# Patient Record
Sex: Female | Born: 1985
Health system: Southern US, Community
[De-identification: ages and names within clinical notes are randomized; demographics above are authoritative.]

## PROBLEM LIST (undated history)

## (undated) ENCOUNTER — Inpatient Hospital Stay (HOSPITAL_COMMUNITY): Payer: Self-pay

## (undated) DIAGNOSIS — M779 Enthesopathy, unspecified: Secondary | ICD-10-CM

## (undated) DIAGNOSIS — Z8619 Personal history of other infectious and parasitic diseases: Secondary | ICD-10-CM

## (undated) DIAGNOSIS — E119 Type 2 diabetes mellitus without complications: Secondary | ICD-10-CM

## (undated) DIAGNOSIS — F32A Depression, unspecified: Secondary | ICD-10-CM

## (undated) DIAGNOSIS — G4730 Sleep apnea, unspecified: Secondary | ICD-10-CM

## (undated) DIAGNOSIS — O24919 Unspecified diabetes mellitus in pregnancy, unspecified trimester: Secondary | ICD-10-CM

## (undated) DIAGNOSIS — F329 Major depressive disorder, single episode, unspecified: Secondary | ICD-10-CM

## (undated) DIAGNOSIS — G43909 Migraine, unspecified, not intractable, without status migrainosus: Secondary | ICD-10-CM

## (undated) DIAGNOSIS — E559 Vitamin D deficiency, unspecified: Secondary | ICD-10-CM

## (undated) DIAGNOSIS — O479 False labor, unspecified: Secondary | ICD-10-CM

## (undated) DIAGNOSIS — N83209 Unspecified ovarian cyst, unspecified side: Secondary | ICD-10-CM

## (undated) DIAGNOSIS — K589 Irritable bowel syndrome without diarrhea: Secondary | ICD-10-CM

## (undated) DIAGNOSIS — N200 Calculus of kidney: Secondary | ICD-10-CM

## (undated) DIAGNOSIS — F419 Anxiety disorder, unspecified: Secondary | ICD-10-CM

## (undated) DIAGNOSIS — O24419 Gestational diabetes mellitus in pregnancy, unspecified control: Secondary | ICD-10-CM

## (undated) DIAGNOSIS — E782 Mixed hyperlipidemia: Secondary | ICD-10-CM

## (undated) HISTORY — DX: Migraine, unspecified, not intractable, without status migrainosus: G43.909

## (undated) HISTORY — DX: Sleep apnea, unspecified: G47.30

## (undated) HISTORY — DX: Depression, unspecified: F32.A

## (undated) HISTORY — PX: MOUTH SURGERY: SHX715

## (undated) HISTORY — DX: Mixed hyperlipidemia: E78.2

## (undated) HISTORY — DX: Anxiety disorder, unspecified: F41.9

## (undated) HISTORY — DX: Enthesopathy, unspecified: M77.9

## (undated) HISTORY — DX: Vitamin D deficiency, unspecified: E55.9

## (undated) HISTORY — DX: Personal history of other infectious and parasitic diseases: Z86.19

## (undated) HISTORY — DX: Type 2 diabetes mellitus without complications: E11.9

---
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
## 2006-08-25 ENCOUNTER — Encounter: Admission: RE | Admit: 2006-08-25 | Discharge: 2006-08-25 | Payer: Self-pay | Admitting: Internal Medicine

## 2008-08-05 ENCOUNTER — Emergency Department (HOSPITAL_COMMUNITY): Admission: EM | Admit: 2008-08-05 | Discharge: 2008-08-06 | Payer: Self-pay | Admitting: Emergency Medicine

## 2008-08-09 ENCOUNTER — Inpatient Hospital Stay (HOSPITAL_COMMUNITY): Admission: EM | Admit: 2008-08-09 | Discharge: 2008-08-11 | Payer: Self-pay | Admitting: Emergency Medicine

## 2009-04-28 ENCOUNTER — Emergency Department (HOSPITAL_COMMUNITY): Admission: EM | Admit: 2009-04-28 | Discharge: 2009-04-28 | Payer: Self-pay | Admitting: Emergency Medicine

## 2009-08-04 ENCOUNTER — Emergency Department (HOSPITAL_COMMUNITY): Admission: EM | Admit: 2009-08-04 | Discharge: 2009-08-04 | Payer: Self-pay | Admitting: Emergency Medicine

## 2009-11-08 ENCOUNTER — Ambulatory Visit (HOSPITAL_COMMUNITY): Admission: RE | Admit: 2009-11-08 | Discharge: 2009-11-08 | Payer: Self-pay | Admitting: Sports Medicine

## 2010-06-15 ENCOUNTER — Inpatient Hospital Stay (HOSPITAL_COMMUNITY)
Admission: AD | Admit: 2010-06-15 | Discharge: 2010-06-15 | Payer: Self-pay | Source: Home / Self Care | Attending: Obstetrics and Gynecology | Admitting: Obstetrics and Gynecology

## 2010-07-17 ENCOUNTER — Ambulatory Visit
Admission: RE | Admit: 2010-07-17 | Discharge: 2010-07-17 | Disposition: A | Discharge status: Home / Self Care | Payer: Commercial Managed Care - PPO | Source: Ambulatory Visit | Attending: Obstetrics and Gynecology | Admitting: Obstetrics and Gynecology

## 2010-07-17 ENCOUNTER — Other Ambulatory Visit: Payer: Self-pay | Admitting: Obstetrics and Gynecology

## 2010-07-17 DIAGNOSIS — R109 Unspecified abdominal pain: Secondary | ICD-10-CM

## 2010-07-17 DIAGNOSIS — M549 Dorsalgia, unspecified: Secondary | ICD-10-CM

## 2010-07-31 ENCOUNTER — Encounter: Payer: 59 | Attending: Obstetrics and Gynecology

## 2010-07-31 DIAGNOSIS — O99810 Abnormal glucose complicating pregnancy: Secondary | ICD-10-CM | POA: Insufficient documentation

## 2010-07-31 DIAGNOSIS — Z713 Dietary counseling and surveillance: Secondary | ICD-10-CM | POA: Insufficient documentation

## 2010-08-26 LAB — WET PREP, GENITAL
Clue Cells Wet Prep HPF POC: NONE SEEN
Trich, Wet Prep: NONE SEEN
Yeast Wet Prep HPF POC: NONE SEEN

## 2010-08-26 LAB — GC/CHLAMYDIA PROBE AMP, GENITAL
Chlamydia, DNA Probe: NEGATIVE
GC Probe Amp, Genital: NEGATIVE

## 2010-10-01 LAB — CLOSTRIDIUM DIFFICILE EIA: C difficile Toxins A+B, EIA: NEGATIVE

## 2010-10-01 LAB — BASIC METABOLIC PANEL
BUN: 2 mg/dL — ABNORMAL LOW (ref 6–23)
BUN: 7 mg/dL (ref 6–23)
CO2: 25 mEq/L (ref 19–32)
CO2: 27 mEq/L (ref 19–32)
Calcium: 8.8 mg/dL (ref 8.4–10.5)
Chloride: 106 mEq/L (ref 96–112)
Chloride: 109 mEq/L (ref 96–112)
Creatinine, Ser: 0.75 mg/dL (ref 0.4–1.2)
GFR calc Af Amer: >60 mL/min (ref >60)
GFR calc non Af Amer: >60 mL/min (ref >60)
GFR calc non Af Amer: >60 mL/min (ref >60)
Glucose, Bld: 102 mg/dL — ABNORMAL HIGH (ref 70–99)
Glucose, Bld: 119 mg/dL — ABNORMAL HIGH (ref 70–99)
Potassium: 3.7 mEq/L (ref 3.5–5.1)
Potassium: 4.3 mEq/L (ref 3.5–5.1)
Sodium: 137 mEq/L (ref 135–145)

## 2010-10-01 LAB — URINE MICROSCOPIC-ADD ON

## 2010-10-01 LAB — COMPREHENSIVE METABOLIC PANEL
ALT: 21 U/L (ref 0–35)
AST: 37 U/L (ref 0–37)
AST: 17 U/L (ref 0–37)
Albumin: 3.2 g/dL — ABNORMAL LOW (ref 3.5–5.2)
Albumin: 3.4 g/dL — ABNORMAL LOW (ref 3.5–5.2)
Alkaline Phosphatase: 67 U/L (ref 39–117)
BUN: 6 mg/dL (ref 6–23)
BUN: 8 mg/dL (ref 6–23)
CO2: 25 mEq/L (ref 19–32)
Calcium: 9.3 mg/dL (ref 8.4–10.5)
Calcium: 8.4 mg/dL (ref 8.4–10.5)
Chloride: 104 mEq/L (ref 96–112)
Creatinine, Ser: 0.77 mg/dL (ref 0.4–1.2)
Creatinine, Ser: 0.69 mg/dL (ref 0.4–1.2)
GFR calc Af Amer: >60 mL/min (ref >60)
GFR calc Af Amer: >60 mL/min (ref >60)
GFR calc non Af Amer: >60 mL/min (ref >60)
GFR calc non Af Amer: >60 mL/min (ref >60)
Glucose, Bld: 112 mg/dL — ABNORMAL HIGH (ref 70–99)
Potassium: 4.1 mEq/L (ref 3.5–5.1)
Sodium: 137 mEq/L (ref 135–145)
Total Bilirubin: 0.6 mg/dL (ref 0.3–1.2)
Total Protein: 6.7 g/dL (ref 6.0–8.3)

## 2010-10-01 LAB — URINALYSIS, ROUTINE W REFLEX MICROSCOPIC
Bilirubin Urine: NEGATIVE
Bilirubin Urine: NEGATIVE
Glucose, UA: NEGATIVE mg/dL
Ketones, ur: NEGATIVE mg/dL
Ketones, ur: 15 mg/dL — AB
Nitrite: NEGATIVE
Nitrite: NEGATIVE
Protein, ur: NEGATIVE mg/dL
Protein, ur: NEGATIVE mg/dL
Specific Gravity, Urine: 1.014 (ref 1.005–1.030)
Urobilinogen, UA: 1 mg/dL (ref 0.0–1.0)
Urobilinogen, UA: 0.2 mg/dL (ref 0.0–1.0)
pH: 7.5 (ref 5.0–8.0)

## 2010-10-01 LAB — HEMOCCULT GUIAC POC 1CARD (OFFICE): Fecal Occult Bld: NEGATIVE

## 2010-10-01 LAB — DIFFERENTIAL
Basophils Absolute: 0 10*3/uL (ref 0.0–0.1)
Basophils Relative: 1 % (ref 0–1)
Eosinophils Absolute: 0.1 10*3/uL (ref 0.0–0.7)
Eosinophils Relative: 2 % (ref 0–5)
Eosinophils Relative: 0 % (ref 0–5)
Lymphocytes Relative: 29 % (ref 12–46)
Lymphocytes Relative: 14 % (ref 12–46)
Lymphs Abs: 2.1 10*3/uL (ref 0.7–4.0)
Lymphs Abs: 1.3 10*3/uL (ref 0.7–4.0)
Monocytes Absolute: 0.6 10*3/uL (ref 0.1–1.0)
Monocytes Relative: 8 % (ref 3–12)
Neutro Abs: 4.3 10*3/uL (ref 1.7–7.7)
Neutro Abs: 7.4 10*3/uL (ref 1.7–7.7)
Neutrophils Relative %: 60 % (ref 43–77)

## 2010-10-01 LAB — LIPASE, BLOOD
Lipase: 37 U/L (ref 11–59)
Lipase: 25 U/L (ref 11–59)

## 2010-10-01 LAB — CBC
HCT: 35 % — ABNORMAL LOW (ref 36.0–46.0)
HCT: 37.8 % (ref 36.0–46.0)
HCT: 39.9 % (ref 36.0–46.0)
Hemoglobin: 13.1 g/dL (ref 12.0–15.0)
Hemoglobin: 13.9 g/dL (ref 12.0–15.0)
MCHC: 34.4 g/dL (ref 30.0–36.0)
MCHC: 34.6 g/dL (ref 30.0–36.0)
MCHC: 34.8 g/dL (ref 30.0–36.0)
MCHC: 33.6 g/dL (ref 30.0–36.0)
MCV: 84.2 fL (ref 78.0–100.0)
MCV: 84.9 fL (ref 78.0–100.0)
MCV: 84.9 fL (ref 78.0–100.0)
MCV: 85.4 fL (ref 78.0–100.0)
Platelets: 254 10*3/uL (ref 150–400)
Platelets: 256 10*3/uL (ref 150–400)
Platelets: 266 10*3/uL (ref 150–400)
Platelets: 207 10*3/uL (ref 150–400)
RBC: 4.46 MIL/uL (ref 3.87–5.11)
RBC: 4.74 MIL/uL (ref 3.87–5.11)
RDW: 12.3 % (ref 11.5–15.5)
RDW: 12.6 % (ref 11.5–15.5)
RDW: 12.8 % (ref 11.5–15.5)
WBC: 4.9 10*3/uL (ref 4.0–10.5)
WBC: 7.2 10*3/uL (ref 4.0–10.5)

## 2010-10-01 LAB — PREGNANCY, URINE: Preg Test, Ur: NEGATIVE

## 2010-10-17 ENCOUNTER — Inpatient Hospital Stay (HOSPITAL_COMMUNITY): Admission: AD | Admit: 2010-10-17 | Payer: Self-pay | Source: Home / Self Care | Admitting: Obstetrics and Gynecology

## 2010-10-28 ENCOUNTER — Encounter (HOSPITAL_COMMUNITY): Payer: Self-pay | Admitting: *Deleted

## 2010-10-28 ENCOUNTER — Inpatient Hospital Stay (HOSPITAL_COMMUNITY)
Admission: AD | Admit: 2010-10-28 | Discharge: 2010-10-31 | DRG: 775 | Disposition: A | Discharge status: Home / Self Care | Payer: 59 | Source: Ambulatory Visit | Attending: Obstetrics and Gynecology | Admitting: Obstetrics and Gynecology

## 2010-10-28 DIAGNOSIS — O99814 Abnormal glucose complicating childbirth: Principal | ICD-10-CM | POA: Diagnosis present

## 2010-10-28 LAB — CBC
HCT: 41.1 % (ref 36.0–46.0)
Hemoglobin: 13.6 g/dL (ref 12.0–15.0)
MCHC: 33.1 g/dL (ref 30.0–36.0)
RBC: 4.81 MIL/uL (ref 3.87–5.11)

## 2010-10-29 ENCOUNTER — Other Ambulatory Visit: Payer: Self-pay | Admitting: Obstetrics and Gynecology

## 2010-10-29 LAB — GLUCOSE, CAPILLARY
Glucose-Capillary: 70 mg/dL (ref 70–99)
Glucose-Capillary: 117 mg/dL — ABNORMAL HIGH (ref 70–99)
Glucose-Capillary: 68 mg/dL — ABNORMAL LOW (ref 70–99)

## 2010-10-29 LAB — RPR: RPR Ser Ql: NONREACTIVE

## 2010-10-29 NOTE — Discharge Summary (Signed)
Abigail Frank, Abigail Frank           ACCOUNT NO.:  [id number]   MEDICAL RECORD NO.:  [id number]          PATIENT TYPE:  INP   LOCATION:  5154                         FACILITY:  [facility]   PHYSICIAN:  Gaspar Garbe, M.D.DATE OF BIRTH:  [birth year]   DATE OF ADMISSION:  08/09/2008  DATE OF DISCHARGE:  08/11/2008                               DISCHARGE SUMMARY   FINAL DIAGNOSES:  1. Terminal ileitis, presumed infectious versus inflammatory.  2. Nausea, vomiting, diarrhea secondary to terminal ileitis.  3. Partial small bowel obstruction, resolved, secondary to terminal      ileitis.  4. History of irritable bowel with constipation.  5. Gastroesophageal reflux disease.  6. Contraceptive pills.   MEDICATIONS ON DISCHARGE:  1. Cipro 500 mg twice daily.  2. Flagyl 500 mg 3 times daily.  The patient is to complete prior      prescribed home course prescribed on Monday minus 2 days as she has      received it here.  3. Nexium p.r.n.  4. Oral birth control pills.  5. Percocet q.6 h. p.r.n. for pain.  6. Phenergan 12.5 mg q.6 h. p.r.n. for nausea.   IMAGING:  The patient underwent a CAT scan in the emergency room which  showed continuation of her terminal ileitis with actually less  inflammation, but possible partial small bowel obstruction on August 09, 2008.   LABORATORY TESTS:  White count 6.4, hemoglobin 12.1, hematocrit 35.0,  platelets 254.  C. diff toxin is negative.  Sodium 141, potassium 4.3,  BUN and creatinine 7 and 0.7, glucose 102.  The patient's pregnancy test  was negative.  Lipase on admission was normal at 37.  Urinalysis showed  red blood cells 21-50, however, she is on her period currently.   PHYSICAL EXAMINATION ON DATE OF DISCHARGE:  VITAL SIGNS:  Blood pressure  115/69, heart rate 82, respiratory rate 16, temperature 97.3, sating 97%  on room air.  GENERAL:  No acute distress.  HEENT:  Normocephalic, atraumatic.  No oral ulcers.  LUNGS:  Clear to auscultation  bilaterally.  ABDOMEN:  Soft, nontender.  Normoactive bowel sounds.  No rebound or  guarding, much improved.  HEART:  Regular rate and rhythm.  EXTREMITIES:  No clubbing, cyanosis, or edema.  NEUROLOGIC:  The patient is oriented to person, place, and time.   SUMMARY OF HOSPITAL COURSE:  Ms. Flaharty was admitted through the  emergency room on August 09, 2008.  She had been seen in the emergency  room the prior Saturday, underwent a CAT scan with a finding of terminal  ileitis, but was not prescribed any antibiotics per the ER doctor.  When  she was seen in my office on late Monday morning, she was having some  abdominal pain and has not increased over the weekend, but she was still  concerned about it seeing the results on her CAT scan.  She was started  on p.o. Cipro and Flagyl, then told that if her symptoms should worsen  over the course of the next week, that she should contact us or go  directly to the emergency room which she promptly  did.  Approximately at  midnight on Wednesday, August 09, 2008, she was having considerable  pain and obstructive-type symptoms.  CT was repeated as noted above and  the patient was admitted.  She was placed on clear liquid diet only,  defervesced after the course of the day and was placed on IV Cipro and  Flagyl.  GI consultation was undertaken as there is a question of the  patient having IBS with consultation as a child and had been worked up  at [facility].  This inflammatory finding and question of Crohn remained  to be a possibility.  GI followed along and indicated that they would  perform outpatient followup as far as further diagnostic testing.  She  underwent C. diff testing which was negative and after relief of her  partial bowel obstruction, had considerable loose bowel movements which  were mostly clear and runny.  She feels considerably better and is not  having any abdominal pain at all and has regained some of her appetite  and  wants to go home.  She is being discharged in good condition on  August 11, 2008.   INSTRUCTIONS:  If the patient gets any further symptoms, she is to  contact our office or Dr. Luan Moore office and if she develops pain that  is severe, return to the emergency room once again.   CONDITION ON DISCHARGE:  Improved.      Gaspar Garbe, M.D.  Electronically Signed     RWT/MEDQ  D:  08/11/2008  T:  08/11/2008  Job:  [id number]   cc:   Graylin Shiver, M.D.

## 2010-10-29 NOTE — H&P (Signed)
Abigail Frank, Abigail Frank           ACCOUNT NO.:  [id number]   MEDICAL RECORD NO.:  [id number]          PATIENT TYPE:  INP   LOCATION:  5154                         FACILITY:  [facility]   PHYSICIAN:  Gaspar Garbe, M.D.DATE OF BIRTH:  06-23-1985   DATE OF ADMISSION:  08/09/2008  DATE OF DISCHARGE:                              HISTORY & PHYSICAL   CHIEF COMPLAINT:  Abdominal pain.   HISTORY OF PRESENT ILLNESS:  The patient is a 25 year old white female  who was initially evaluated in the emergency room for this condition on  Saturday evening.  She underwent a CAT scan of her abdomen showing  swelling of her transverse ileum and was simply given pain medications  and anti-nausea medicines but no antibiotics by the ER physician.  She  came in to see me Monday morning after feeling no better and was  promptly started on antibiotics including Cipro and Flagyl by mouth.  Her exam did not reveal her to have any perforation or surgical signs  and she was told if her symptoms should worsen that she needed to go  directly to the emergency room.  She indicated that she felt better for  approximately the next 36 hours and that approximately midnight on  August 09, 2008 felt like she was having more pain and having more  gas.  She went again to the emergency room and was evaluated with a CAT  scan which showed the possibility of a partial small-bowel obstruction  at the terminal ileum area, but that the inflammation seen on the prior  CAT scan was actually less and I was asked to admit the patient.   The patient complains mostly of belly pain and burping as noted above  but is not having any new fevers or chills at this point and has been  compliant in her Cipro and Flagyl.  Of note, she indicates historically  that she has had a workup for IBS with constipation done at [facility] 6-7  years ago which was not revealing for any inflammatory bowel disease.  We discussed on Monday after reviewing  her CAT scan that this may in  fact be the case and that she would warrant further evaluation when she  is clinically better.  I have asked Dr. Evette Cristal with [facility] to see the  patient in consultation at this time as well.   MEDICATIONS:  1. Cipro 500 mg twice daily x10 days.  2. Flagyl 500 mg 3 times a day x10 days started on August 07, 2008.  3. Nexium p.r.n.  4. Birth control pills.  5. Phenergan 25 mg IM was given in the office.  6. Zofran 4 mg q.6 h. p.r.n. nausea.  7. Percocet 1-2 tablets every 4-6 hours as needed for severe pain,      otherwise take Tylenol.   PAST MEDICAL HISTORY:  1. Asthma in childhood.  2. Question of IBS with constipation as noted above.  3. History of oral surgeries with left upper canine.   SOCIAL HISTORY:  She is married with no children.  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  She is a nonsmoker and  nondrinker.   FAMILY HISTORY:  Mother and father are both alive.  Mother has asthma,  senile dementia, congestive heart failure, and peripheral neuropathy as  well as thyroid disease.  Father has diabetes, hypertension, obesity,  asthma, and hearing problems.  She has had a maternal grandmother with  right colon cancer at the age of 30 and a positive history for diabetes.  She is the only child and does not have children of her own.   ALLERGIES:  VICODIN causes nausea and vomiting.   REVIEW OF SYSTEMS:  Negative on a 12-point review of systems other than  for generalized fatigue, malaise, and abdominal pain as noted.   PHYSICAL EXAMINATION:  VITAL SIGNS:  Temperature 97.6, pulse 79,  respiratory rate 14, blood pressure 115/68, and saturating 99% on room  air.  GENERAL:  No acute distress.  HEENT:  Normocephalic and atraumatic.  PERRLA.  EOMI.  ENT is within  normal limits.  NECK:  Supple.  No lymphadenopathy, JVD, or bruit.  HEART:  Regular rate and rhythm.  No murmur, rub, or gallop.  LUNGS:  Clear to auscultation  bilaterally.  ABDOMEN:  The patient has diffuse tenderness which is increased from her  exam on Monday.  She does not have any rebound or guarding at this  point.  EXTREMITIES:  No clubbing, cyanosis, or edema.  NEURO:  The patient is oriented to person, place, and time.   RADIOLOGY:  The patient underwent a CT of abdomen and pelvis today which  was compared to that performed over the course of weekend.  It shows  mild thickening of the ileum which has actually improved since prior  study.  There is no free fluid or abscess but she has mild dilatation of  the jejunum and a possible small-bowel obstruction.   LABORATORY TESTS:  Fecal occult blood testing is negative.  Sodium 137,  potassium 4.1, glucose slightly elevated at 112, BUN and creatinine are  6 and 0.7 respectively.  LFTs are within normal limits.  Albumin  slightly decreased at 3.2.  Lipase level is normal at 37.  CBC shows a  white count of 7.2, hemoglobin 13.9, hematocrit 39.9, and platelets 266.  Urine pregnancy test was performed and was negative.  Urinalysis showed  red blood cells 21-50, however, she is on her period presently and her  urinalysis is negative for infection.   HOSPITAL COURSE:  Terminal ileitis.  This may be of infectious cause and  she has had some response to the Cipro and Flagyl, however, this can be  a peculiar area to have this sort of illness.  We discussed the  possibility that she may require a GI evaluation as an outpatient.  Now  that she is being admitted, I have asked GI to see her in consultation  and may consider performing a colonoscopy during the stay or adding  steroids to her regimen as they feel is necessary.  They also would have  the option of following her as an outpatient for further workup of this.  I will trust in their judgment regarding this.  In the meantime, I will  place her on Cipro and Flagyl IV, hydrate her with IV fluids and provide  pain releasing antiemetics as well as  serial abdominal exams.  We will  put her on a liquid diet and advance as tolerated and follow her mostly  clinically.      Richard W.  Tisovec, M.D.  Electronically Signed    RWT/MEDQ  D:  08/09/2008  T:  08/10/2008  Job:  [id number]   cc:   Graylin Shiver, M.D.

## 2010-10-29 NOTE — Consult Note (Signed)
Abigail Frank, Abigail Frank           ACCOUNT NO.:  [id number]   MEDICAL RECORD NO.:  [id number]          PATIENT TYPE:  INP   LOCATION:  5154                         FACILITY:  [facility]   PHYSICIAN:  Graylin Shiver, M.D.   DATE OF BIRTH:  December 24, 1985   DATE OF CONSULTATION:  08/09/2008  DATE OF DISCHARGE:                                 CONSULTATION   We were asked to see Abigail Frank today in consultation for severe right  lower quadrant pain by Dr. Wylene Simmer.   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  She began having aches and pains and fever  last week and 10 days ago.  She went to the ED for severe right lower  quadrant pain and nausea.  She was treated with pain medications and  nausea medications and sent home.  Her problems continued and she went  to her primary care physician, Dr. Wylene Simmer on Monday who put her on  Cipro and Flagyl.  The patient initially felt better and then this  morning was able to eat a little bit of breakfast when her severe right  lower quadrant pain returned and she came to the emergency department.  She is now being admitted.  The patient has had fever and nausea with  her pain, but she has not had vomiting or diarrhea.  Her last bowel  movement was Saturday.  She said it was small and dark, but she  describes no hematemesis, no bright red blood per rectum and no ongoing  melena.  Her CT scan from August 05, 2008, showed TI thickening.  Her  CT scan done this morning shows resolution of the TI thickening and new  onset jejunal dilation and ileum thickening.   Past medical history is significant for:  1. Asthma.  2. GERD.  3. Constipation.  4. Predominant IBS.   Current medications includes birth control patch.  She has also been on  Cipro, Flagyl, Zofran, and Percocet for the last couple of days.   ALLERGIES:  She has an allergy to VICODIN.   Review of systems is negative except per  HPI.   Social history is negative for drugs, negative for tobacco.  She drinks  occasional drink on the weekends.   Family history is positive for her grandmother who had colon cancer.  Negative for IBD, negative for liver or gallbladder disease.    On physical exam, she is alert and oriented, pleasant to speak with her  family is present.  Temperature is 97.6, pulse 84, respirations 20,  blood pressure is 104/71.  Heart has a regular rate and rhythm, slightly  tachy.  Lungs are clear to auscultation.  Abdomen is soft.  She has good  bowel sounds.  They are nontympanic.  She is diffusely tender  particularly in the right lower quadrant and her epigastrium.   Labs show a BMET within normal limits.  Lipase 37.  LFTs are within  normal limits.  Hemoglobin as well within normal limits with a white  count of 7.2, hemoglobin 13.9, hematocrit 39.9,  platelets 266,000.   Radiological exams include a CT scan that was done on August 05, 2008,  that showed edema of the distal TI and a second CT scan that was done  this morning on August 09, 2008, that shows:  Mild dilation of the  jejunum, thickening of the TI has improved, mild thickening of the ileum  remains.   ASSESSMENT:  Dr. Wandalee Ferdinand has seen and examined the patient, collected  history, and reviewed her chart.  His impression is this is a pleasant  25 year old female with severe right lower quadrant pain, on Cipro and  Flagyl for 2 days, appears a viral; however, Crohn's is also in the  differential.  We will plan to give her bowel rest as well as some  assistance in cleaning out from below with suppositories.  If she is not  better in a day or two on hydration and nausea medications, we will plan  to do possibly a CT enterography or colonoscopy.   Thanks very much for this consultation.  We will be glad to follow with  you.      Stephani Police, PA    ______________________________  Graylin Shiver, M.D.    MLY/MEDQ   D:  08/09/2008  T:  08/09/2008  Job:  [id number]   cc:   Gaspar Garbe, M.D.

## 2010-10-30 LAB — CBC
HCT: 36.1 % (ref 36.0–46.0)
MCH: 28.6 pg (ref 26.0–34.0)
MCHC: 33.2 g/dL (ref 30.0–36.0)
MCV: 86 fL (ref 78.0–100.0)
Platelets: 145 10*3/uL — ABNORMAL LOW (ref 150–400)
RDW: 14 % (ref 11.5–15.5)
WBC: 12.9 10*3/uL — ABNORMAL HIGH (ref 4.0–10.5)

## 2010-10-30 LAB — GLUCOSE, CAPILLARY: Glucose-Capillary: 85 mg/dL (ref 70–99)

## 2010-10-31 LAB — GLUCOSE, CAPILLARY: Glucose-Capillary: 100 mg/dL — ABNORMAL HIGH (ref 70–99)

## 2011-05-11 ENCOUNTER — Emergency Department (HOSPITAL_COMMUNITY)
Admission: EM | Admit: 2011-05-11 | Discharge: 2011-05-11 | Disposition: A | Discharge status: Home / Self Care | Payer: 59 | Attending: Emergency Medicine | Admitting: Emergency Medicine

## 2011-05-11 ENCOUNTER — Emergency Department (HOSPITAL_COMMUNITY): Payer: 59

## 2011-05-11 ENCOUNTER — Encounter (HOSPITAL_COMMUNITY): Payer: Self-pay | Admitting: Emergency Medicine

## 2011-05-11 DIAGNOSIS — R1031 Right lower quadrant pain: Secondary | ICD-10-CM | POA: Insufficient documentation

## 2011-05-11 DIAGNOSIS — R1032 Left lower quadrant pain: Secondary | ICD-10-CM | POA: Insufficient documentation

## 2011-05-11 DIAGNOSIS — M545 Low back pain, unspecified: Secondary | ICD-10-CM | POA: Insufficient documentation

## 2011-05-11 DIAGNOSIS — J45909 Unspecified asthma, uncomplicated: Secondary | ICD-10-CM | POA: Insufficient documentation

## 2011-05-11 DIAGNOSIS — K589 Irritable bowel syndrome without diarrhea: Secondary | ICD-10-CM | POA: Insufficient documentation

## 2011-05-11 HISTORY — DX: Unspecified diabetes mellitus in pregnancy, unspecified trimester: O24.919

## 2011-05-11 HISTORY — DX: Irritable bowel syndrome, unspecified: K58.9

## 2011-05-11 HISTORY — DX: Unspecified ovarian cyst, unspecified side: N83.209

## 2011-05-11 HISTORY — DX: Calculus of kidney: N20.0

## 2011-05-11 LAB — SYPHILIS: RPR W/REFLEX TO RPR TITER AND TREPONEMAL ANTIBODIES, TRADITIONAL SCREENING AND DIAGNOSIS ALGORITHM: RPR Ser Ql: NONREACTIVE

## 2011-05-11 LAB — WET PREP, GENITAL
Trich, Wet Prep: NONE SEEN
Yeast Wet Prep HPF POC: NONE SEEN

## 2011-05-11 LAB — URINALYSIS, ROUTINE W REFLEX MICROSCOPIC
Glucose, UA: NEGATIVE mg/dL
Ketones, ur: NEGATIVE mg/dL
Protein, ur: NEGATIVE mg/dL

## 2011-05-11 LAB — URINE MICROSCOPIC-ADD ON

## 2011-05-11 MED ORDER — KETOROLAC TROMETHAMINE 30 MG/ML IJ SOLN
30.0000 mg | Freq: Once | INTRAMUSCULAR | Status: AC
  Administered 2011-05-11: 30 mg via INTRAVENOUS
  Filled 2011-05-11: qty 1

## 2011-05-11 MED ORDER — TRAMADOL-ACETAMINOPHEN 37.5-325 MG PO TABS: ORAL_TABLET | ORAL | Status: AC

## 2011-05-11 MED ORDER — HYOSCYAMINE SULFATE 0.125 MG SL SUBL: 0.1250 mg | SUBLINGUAL_TABLET | SUBLINGUAL | Status: DC | PRN

## 2011-05-11 MED ORDER — ONDANSETRON HCL 4 MG PO TABS: 4.0000 mg | ORAL_TABLET | Freq: Four times a day (QID) | ORAL | Status: AC

## 2011-05-11 MED ORDER — ONDANSETRON HCL 4 MG PO TABS: 4.0000 mg | ORAL_TABLET | Freq: Four times a day (QID) | ORAL | Status: DC

## 2011-05-11 MED ORDER — SODIUM CHLORIDE 0.9 % IV SOLN
INTRAVENOUS | Status: DC
  Administered 2011-05-11: 10:00:00 via INTRAVENOUS

## 2011-05-11 MED ORDER — TRAMADOL-ACETAMINOPHEN 37.5-325 MG PO TABS: ORAL_TABLET | ORAL | Status: DC

## 2011-05-11 MED ORDER — HYOSCYAMINE SULFATE 0.125 MG SL SUBL: 0.1250 mg | SUBLINGUAL_TABLET | SUBLINGUAL | Status: AC | PRN

## 2011-05-11 MED ORDER — ONDANSETRON HCL 4 MG/2ML IJ SOLN
4.0000 mg | Freq: Once | INTRAMUSCULAR | Status: AC
  Administered 2011-05-11: 4 mg via INTRAVENOUS
  Filled 2011-05-11: qty 2

## 2011-05-11 MED ORDER — MORPHINE SULFATE 2 MG/ML IJ SOLN
2.0000 mg | Freq: Once | INTRAMUSCULAR | Status: AC
  Administered 2011-05-11: 2 mg via INTRAVENOUS
  Filled 2011-05-11: qty 1

## 2011-05-11 MED ORDER — HYOSCYAMINE SULFATE 0.125 MG SL SUBL
0.1250 mg | SUBLINGUAL_TABLET | Freq: Once | SUBLINGUAL | Status: AC
  Administered 2011-05-11: 0.125 mg via SUBLINGUAL
  Filled 2011-05-11: qty 1

## 2011-05-11 MED ORDER — IOHEXOL 300 MG/ML  SOLN
100.0000 mL | Freq: Once | INTRAMUSCULAR | Status: AC | PRN
  Administered 2011-05-11: 100 mL via INTRAVENOUS

## 2011-05-11 NOTE — ED Notes (Signed)
Ultrasound called about the delay and she is on the list .  Pt aware.

## 2011-05-11 NOTE — ED Notes (Signed)
Pt states symptoms for near a month on and off.

## 2011-05-11 NOTE — ED Notes (Signed)
Pt reports abdominal pain ongoing x 1 month. Pt reports episodes occur with bowel problems. Last normal BM today. Pt denies N/V. Reports pain radiates to back.

## 2011-05-11 NOTE — ED Notes (Signed)
Pt back from ct, states no needs.

## 2011-05-11 NOTE — ED Notes (Signed)
Pt remains in ultrasound.

## 2011-05-11 NOTE — ED Provider Notes (Signed)
History     CSN: [id number] Arrival date & time: 05/11/2011  9:05 AM   First MD Initiated Contact with Patient 05/11/11 [phone]      Chief Complaint  Patient presents with  . Abdominal Pain    onset 20 mins ago while at work.     (Consider location/radiation/quality/duration/timing/severity/associated sxs/prior treatment) HPI Patient relates today she had her third episode of lower abdominal pain that she's experienced in the past month. She states the other to start on the right side in the lower abdomen and radiates across her abdomen and into her lower back. The first 2 episodes only lasted 2-3 hours. She has noticed the pain doesn't start immediately after using the bathroom but usually start shortly after using bathroom. Patient works in our hospital as a Engineer, civil (consulting) and states she used the bathroom at 8 AM and then 8:30 AM she started having the same pain she states it seems to start in her right lower quadrant and radiate to the her left lower quadrant into her lower back she states it's a severe cramping in her abdomen feels hard to touch. She denies nausea vomiting vaginal discharge hematuria. She states walking and movement makes it feel worse. Nothing makes it feel better. She states she's had an ovarian cyst before and this is similar but the pain today is much worse. She states she has a history of irritable bowel syndrome but this is much different. She relates she delivered her baby about 6 months ago and has been on birth control pills about 3 months.  Primary Care  Dr Neale Burly GYN Dr Cherly Hensen  Past Medical History  Diagnosis Date  . Asthma   . IBS (irritable bowel syndrome)   . Ovarian cyst   . Kidney stones   . Diabetes in pregnancy     History reviewed. No pertinent past surgical history.  History reviewed. No pertinent family history.  History  Substance Use Topics  . Smoking status: Never Smoker   . Smokeless tobacco: Not on file  . 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    Allergies  Review of patient's allergies indicates no known allergies.  Home Medications   Current Outpatient Rx  Name Route Sig Dispense Refill  . NORETHIN ACE-ETH ESTRAD-FE 1-20 MG-MCG PO TABS Oral Take 1 tablet by mouth daily.        BP 126/67  Pulse 104  Temp(Src) 97.4 F (36.3 C) (Oral)  Resp 20  SpO2 96%  LMP 04/20/2011  Breastfeeding? Unknown  Physical Exam  Vitals reviewed. Constitutional: She is oriented to person, place, and time. She appears well-developed and well-nourished.       Appears uncomfortable  HENT:  Head: Normocephalic and atraumatic.  Right Ear: External ear normal.  Left Ear: External ear normal.  Nose: Nose normal.  Mouth/Throat: Oropharynx is clear and moist.  Eyes: Conjunctivae and EOM are normal. Pupils are equal, round, and reactive to light.  Neck: Normal range of motion. Neck supple.  Cardiovascular: Normal rate, regular rhythm and normal heart sounds.   Pulmonary/Chest: Effort normal and breath sounds normal.  Abdominal: Soft.       She has diffuse tenderness of her lower abdomen without localization or  guarding. Her flanks are nontender to percussion. Her pain is in her back is more in the sacral area  Genitourinary:       She has normal external genitalia there is minimal discharge seen. Her cervix appears balloon color. Her uterus is easily palpable and his mildly tender. Her left ovary is nonpalpable but the area is tender to palpation she does not have tenderness to palpation on the right although she feels like her pain is more centered on the right  Neurological: She is alert and oriented to person, place, and time.  Skin: Skin is warm and dry.  Psychiatric: Her behavior is normal. Thought content normal.       Appears slightly anxious because of  pain    ED Course  Procedures (including critical care time)  10:45 Pt has had toradol IV for pain, which has helped, doesn't want anything else for pain at this time, advised to let us know if she changes her mind.  After the pelvic ultrasound patient was having some increased pain she wanted to get a low dose pain medicine she was given morphine 2 mg IV.  We have discussed her CT results. I will try Levsin for possible IBS-type pain. She relates she was supposed to see Dr. Evette Cristal a couple years ago when she was having some prior abdominal problems and there was some issue as to whether she had possible Crohn's. She states she's going to see him now.  Results for orders placed during the hospital encounter of 05/11/11  WET PREP, GENITAL      Component Value Range   Yeast, Wet Prep NONE SEEN  NONE SEEN    Trich, Wet Prep NONE SEEN  NONE SEEN    Clue Cells, Wet Prep NONE SEEN  NONE SEEN    WBC, Wet Prep HPF POC FEW (*) NONE SEEN   URINALYSIS, ROUTINE W REFLEX MICROSCOPIC      Component Value Range   Color, Urine YELLOW  YELLOW    Appearance CLOUDY (*) CLEAR    Specific Gravity, Urine 1.018  1.005 - 1.030    pH 7.0  5.0 - 8.0    Glucose, UA NEGATIVE  NEGATIVE (mg/dL)   Hgb urine dipstick NEGATIVE  NEGATIVE    Bilirubin Urine NEGATIVE  NEGATIVE    Ketones, ur NEGATIVE  NEGATIVE (mg/dL)   Protein, ur NEGATIVE  NEGATIVE (mg/dL)   Urobilinogen, UA 0.2  0.0 - 1.0 (mg/dL)   Nitrite NEGATIVE  NEGATIVE    Leukocytes, UA TRACE (*) NEGATIVE   PREGNANCY, URINE      Component Value Range   Preg Test, Ur NEGATIVE    URINE MICROSCOPIC-ADD ON      Component Value Range   Squamous Epithelial / LPF RARE  RARE    WBC, UA 0-2  <3 (WBC/hpf)   Laboratory interpretation normal  US Transvaginal Non-ob  05/11/2011  *RADIOLOGY REPORT*  Clinical Data:  25 year old female with right pelvic pain.  TRANSABDOMINAL AND TRANSVAGINAL ULTRASOUND OF PELVIS DOPPLER ULTRASOUND OF OVARIES  Technique:  Both  transabdominal and transvaginal ultrasound examinations of the pelvis were performed. Transabdominal technique was performed for global imaging of the pelvis including uterus, ovaries, adnexal regions, and pelvic cul-de-sac.  It was necessary to proceed with endovaginal exam following the transabdominal exam to visualize the ovaries and endometrial stripe.  Color and duplex Doppler ultrasound was utilized to evaluate blood flow to the ovaries.  Comparison:  08/09/2008 CT  Findings:  Uterus:  The uterus is  normal in size and echogenicity, measuring 7.5 x 4.5 x 6 cm.  No focal uterine masses are identified.  Endometrium:  The endometrial stripe is homogeneous measuring 9 mm in greatest diameter.  Right ovary: The right ovary is normal in size and appearance measuring 4.6 x 2.1 x 2.9 cm.  A 1.4 cm cyst / follicle is identified. No solid masses are identified.  Left ovary:     The left ovary is unremarkable measuring 3.3 x 2.0 x 1.6 cm.  Pulsed Doppler evaluation demonstrates normal low-resistance arterial and venous waveforms in both ovaries.  IMPRESSION: Normal exam.  No evidence of pelvic mass or other significant abnormality.  No sonographic evidence for ovarian torsion.  Original Report Authenticated By: Rosendo Gros, M.D.    Ct Abdomen Pelvis W Contrast  05/11/2011  *RADIOLOGY REPORT*  Clinical Data: 25 year old female with abdominal and pelvic pain. History of Crohn's disease.  CT ABDOMEN AND PELVIS WITH CONTRAST  Technique:  Multidetector CT imaging of the abdomen and pelvis was performed following the standard protocol during bolus administration of intravenous contrast.  Contrast: OMNIPAQUE IOHEXOL 300 MG/ML IV SOLN  Comparison: 05/11/2011 ultrasound and 08/09/2008 CT  Findings: The lung bases are clear.  The liver, spleen, adrenal glands, gallbladder, pancreas and kidneys are unremarkable except for a small probable right renal cyst. Please note that parenchymal abnormalities may be missed as  intravenous contrast was not administered. There is no evidence of enlarged lymph nodes, biliary dilatation or abdominal aortic aneurysm.  Small amount of free fluid in the pelvis may be physiologic. The bowel and bladder are unremarkable. The appendix is not identified. No abnormal areas of small bowel wall thickening or inflammation noted.  No acute or suspicious bony abnormalities are present.  IMPRESSION: Small amount of free pelvic fluid which is nonspecific but may be physiologic.  No other acute or significant abnormalities identified.  Original Report Authenticated By: Rosendo Gros, M.D.     Diagnoses that have been ruled out:  Diagnoses that are still under consideration:  Final diagnoses:  Abdominal pain    New Prescriptions   HYOSCYAMINE (LEVSIN/SL) 0.125 MG SL TABLET    Place 1 tablet (0.125 mg total) under the tongue every 4 (four) hours as needed for cramping.   ONDANSETRON (ZOFRAN) 4 MG TABLET    Take 1 tablet (4 mg total) by mouth every 6 (six) hours.   TRAMADOL-ACETAMINOPHEN (ULTRACET) 37.5-325 MG PER TABLET    2 tabs po QID prn pain     Plan discharge to f/u with Dr Evette Cristal and Dr Cherly Hensen  Devoria Albe, MD, FACEP     MDM          Ward Givens, MD 05/11/11 [phone]

## 2011-05-12 LAB — GC/CHLAMYDIA PROBE AMP, GENITAL: GC Probe Amp, Genital: NEGATIVE

## 2012-02-10 ENCOUNTER — Other Ambulatory Visit: Payer: Self-pay

## 2012-10-09 ENCOUNTER — Emergency Department (HOSPITAL_COMMUNITY)
Admission: EM | Admit: 2012-10-09 | Discharge: 2012-10-09 | Disposition: A | Discharge status: Home / Self Care | Payer: 59 | Source: Home / Self Care

## 2012-10-09 ENCOUNTER — Encounter (HOSPITAL_COMMUNITY): Payer: Self-pay | Admitting: Emergency Medicine

## 2012-10-09 DIAGNOSIS — J020 Streptococcal pharyngitis: Secondary | ICD-10-CM

## 2012-10-09 LAB — POCT RAPID STREP A: Streptococcus, Group A Screen (Direct): POSITIVE — AB

## 2012-10-09 MED ORDER — AMOXICILLIN 500 MG PO CAPS: 500.0000 mg | ORAL_CAPSULE | Freq: Two times a day (BID) | ORAL | Status: DC

## 2012-10-09 NOTE — ED Provider Notes (Signed)
Medical screening examination/treatment/procedure(s) were performed by resident physician or non-physician practitioner and as supervising physician I was immediately available for consultation/collaboration.   Shahid Flori DOUGLAS MD.   Giovan Pinsky D Sriansh Farra, MD 10/09/12 1725 

## 2012-10-09 NOTE — ED Notes (Signed)
Pt c/o severe sore throat and generalized body aches. Pt states symptoms started this a.m.  Denies any other symptoms.

## 2012-10-09 NOTE — ED Provider Notes (Signed)
History     CSN: [id number]  Arrival date & time 10/09/12  1551   None     Chief Complaint  Patient presents with  . Sore Throat    severe sore throat this a.m. generalized body aches.     (Consider location/radiation/quality/duration/timing/severity/associated sxs/prior treatment) HPI Comments: Abigail Frank presents with a sore throat and swollen lymph nodes that started this am. She has been exposed to strep. No fevers. See remainder of ROS.   Patient is a 27 y.o. female presenting with pharyngitis.  Sore Throat    Past Medical History  Diagnosis Date  . Asthma   . IBS (irritable bowel syndrome)   . Ovarian cyst   . Kidney stones   . Diabetes in pregnancy     History reviewed. No pertinent past surgical history.  History reviewed. No pertinent family history.  History  Substance Use Topics  . Smoking status: Never Smoker   . Smokeless tobacco: Not on file  . Alcohol Use: No    OB History   Grav Para Term Preterm Abortions TAB SAB Ect Mult Living   1               Review of Systems  Constitutional: Positive for fatigue. Negative for fever and chills.  All other systems reviewed and are negative.    Allergies  Review of patient's allergies indicates no known allergies.  Home Medications   Current Outpatient Rx  Name  Route  Sig  Dispense  Refill  . amoxicillin (AMOXIL) 500 MG capsule   Oral   Take 1 capsule (500 mg total) by mouth 2 (two) times daily.   14 capsule   0   . norethindrone-ethinyl estradiol (JUNEL FE,GILDESS FE,LOESTRIN FE) 1-20 MG-MCG tablet   Oral   Take 1 tablet by mouth daily.             BP 116/92  Pulse 109  Temp(Src) 98.3 F (36.8 C) (Oral)  Resp 17  SpO2 100%  LMP 09/11/2012  Breastfeeding? No  Physical Exam  Constitutional: She appears well-developed and well-nourished. No distress.  HENT:  Head: Normocephalic and atraumatic.  Mouth/Throat: Uvula is midline and mucous membranes are normal. Oropharyngeal  exudate and posterior oropharyngeal erythema present. No posterior oropharyngeal edema or tonsillar abscesses.  Neck: Neck supple.  Cardiovascular: Normal rate and regular rhythm.   Pulmonary/Chest: Effort normal and breath sounds normal. No respiratory distress.  Lymphadenopathy:    She has cervical adenopathy.  Skin: Skin is warm and dry.    ED Course  Procedures (including critical care time)  Labs Reviewed  POCT RAPID STREP A ([facility] CARE ONLY) - Abnormal; Notable for the following:    Streptococcus, Group A Screen (Direct) POSITIVE (*)    All other components within normal limits   No results found.   1. Strep pharyngitis       MDM  Strep Pharyngitis-Antibiotic therapy for 10 days.         Azucena Fallen, PA-C 10/09/12 1704

## 2012-12-10 LAB — OB RESULTS CONSOLE ABO/RH: RH Type: POSITIVE

## 2012-12-10 LAB — OB RESULTS CONSOLE GC/CHLAMYDIA
Chlamydia: NEGATIVE
Gonorrhea: NEGATIVE

## 2012-12-10 LAB — OB RESULTS CONSOLE RPR: RPR: NONREACTIVE

## 2012-12-10 LAB — OB RESULTS CONSOLE HEPATITIS B SURFACE ANTIGEN: Hepatitis B Surface Ag: NEGATIVE

## 2012-12-10 LAB — OB RESULTS CONSOLE RUBELLA ANTIBODY, IGM: Rubella: IMMUNE

## 2012-12-10 LAB — OB RESULTS CONSOLE HIV ANTIBODY (ROUTINE TESTING): HIV: NONREACTIVE

## 2012-12-10 LAB — OB RESULTS CONSOLE ANTIBODY SCREEN: ANTIBODY SCREEN: NEGATIVE

## 2013-04-03 ENCOUNTER — Encounter (HOSPITAL_COMMUNITY): Payer: Self-pay | Admitting: *Deleted

## 2013-04-03 ENCOUNTER — Inpatient Hospital Stay (HOSPITAL_COMMUNITY)
Admission: AD | Admit: 2013-04-03 | Discharge: 2013-04-03 | Disposition: A | Discharge status: Home / Self Care | Payer: 59 | Source: Ambulatory Visit | Attending: Obstetrics and Gynecology | Admitting: Obstetrics and Gynecology

## 2013-04-03 DIAGNOSIS — N200 Calculus of kidney: Secondary | ICD-10-CM | POA: Insufficient documentation

## 2013-04-03 DIAGNOSIS — O99891 Other specified diseases and conditions complicating pregnancy: Secondary | ICD-10-CM | POA: Insufficient documentation

## 2013-04-03 LAB — URINALYSIS, ROUTINE W REFLEX MICROSCOPIC
Bilirubin Urine: NEGATIVE
Nitrite: NEGATIVE
Specific Gravity, Urine: 1.02 (ref 1.005–1.030)
pH: 6.5 (ref 5.0–8.0)

## 2013-04-03 LAB — URINE MICROSCOPIC-ADD ON

## 2013-04-03 MED ORDER — LACTATED RINGERS IV BOLUS (SEPSIS): 1000.0000 mL | Freq: Once | INTRAVENOUS | Status: DC

## 2013-04-03 MED ORDER — OXYCODONE-ACETAMINOPHEN 7.5-325 MG PO TABS: 1.0000 | ORAL_TABLET | ORAL | Status: DC | PRN

## 2013-04-03 MED ORDER — HYDROMORPHONE HCL PF 1 MG/ML IJ SOLN: 2.0000 mg | Freq: Once | INTRAMUSCULAR | Status: DC

## 2013-04-03 NOTE — MAU Note (Signed)
Pt states that she has a history of kidney stones and today she noticed that she was having some pain in her urethra area and was having difficulty voiding so she called and was told to come in and be evaluated. Upon arrival to MAU pt passed a small stone when giving a urine sample. She states she is now sore but not hurting like previously today

## 2013-04-03 NOTE — MAU Provider Note (Signed)
History     CSN: [id number]  Arrival date and time: 04/03/13 1758 Provider on unit: 1825 Provider at bedside: 1835      Chief Complaint  Patient presents with  . Nephrolithiasis   HPI  Ms. Maiya Kates is a 27 y.o. G37P1001 female at 4.[redacted]wks gestation presenting with c/o pain from a kidney stone and Contractions.  She has a h/o kidney stones in last pregnancy.  No VB or LOF.  Past Medical History  Diagnosis Date  . Asthma   . IBS (irritable bowel syndrome)   . Ovarian cyst   . Kidney stones   . Diabetes in pregnancy     History reviewed. No pertinent past surgical history.  History reviewed. No pertinent family history.  History  Substance Use Topics  . Smoking status: Never Smoker   . Smokeless tobacco: Not on file  . Alcohol Use: No    Allergies: No Known Allergies  Prescriptions prior to admission  Medication Sig Dispense Refill  . amoxicillin (AMOXIL) 500 MG capsule Take 1 capsule (500 mg total) by mouth 2 (two) times daily.  14 capsule  0  . norethindrone-ethinyl estradiol (JUNEL FE,GILDESS FE,LOESTRIN FE) 1-20 MG-MCG tablet Take 1 tablet by mouth daily.          Review of Systems  Constitutional: Negative.   HENT: Negative.   Eyes: Negative.   Respiratory: Negative.   Cardiovascular: Negative.   Gastrointestinal: Positive for abdominal pain.  Genitourinary: Positive for dysuria and hematuria.  Musculoskeletal: Negative.   Skin: Negative.   Neurological: Negative.   Endo/Heme/Allergies: Negative.   Psychiatric/Behavioral: Negative.   Stone passed while collecting CCUA  FHR: 145, moderate variability, accelerations present, decelerations absent TOCO: none  Results for orders placed during the hospital encounter of 04/03/13 (from the past 24 hour(s))  URINALYSIS, ROUTINE W REFLEX MICROSCOPIC     Status: Abnormal   Collection Time    04/03/13  6:10 PM      Result Value Range   Color, Urine YELLOW  YELLOW   APPearance CLEAR  CLEAR   Specific Gravity, Urine 1.020  1.005 - 1.030   pH 6.5  5.0 - 8.0   Glucose, UA NEGATIVE  NEGATIVE mg/dL   Hgb urine dipstick TRACE (*) NEGATIVE   Bilirubin Urine NEGATIVE  NEGATIVE   Ketones, ur NEGATIVE  NEGATIVE mg/dL   Protein, ur NEGATIVE  NEGATIVE mg/dL   Urobilinogen, UA 0.2  0.0 - 1.0 mg/dL   Nitrite NEGATIVE  NEGATIVE   Leukocytes, UA TRACE (*) NEGATIVE  URINE MICROSCOPIC-ADD ON     Status: Abnormal   Collection Time    04/03/13  6:10 PM      Result Value Range   Squamous Epithelial / LPF MANY (*) RARE   WBC, UA 0-2  <3 WBC/hpf   RBC / HPF 0-2  <3 RBC/hpf   Bacteria, UA RARE  RARE   Urine-Other MUCOUS PRESENT     Physical Exam   Blood pressure 108/57, pulse 91, resp. rate 18, last menstrual period 10/15/2012.  Physical Exam  Constitutional: She appears well-developed and well-nourished.  HENT:  Head: Normocephalic.  Eyes: Pupils are equal, round, and reactive to light.  Neck: Normal range of motion.  Respiratory: Effort normal.  GI: Soft.  Genitourinary:  Gravid, soft, non-tender  Musculoskeletal: Normal range of motion.  Neurological: She is alert.  Skin: Skin is warm and dry.  Psychiatric: She has a normal mood and affect. Her behavior is normal. Judgment and thought content normal.  MAU Course  Procedures CCUA UCx EFM Assessment and Plan  27 y.o. G2P1001, IUP @ 24.2 wks Kidney Stone  Send stone to lab for analysis Discharge home Percocet 5/325mg  1 tab every 6 hours prn pain Disp #20, NR Drink lots of Diet [employer] Lemonade Keep scheduled appointment with Dr. Cherly Hensen 03/17/2013  *Dr. Cherly Hensen consulted on plan - agrees with plan Kenard Gower, MSN, CNM 04/03/2013, 6:35 PM

## 2013-04-04 LAB — CULTURE, OB URINE
Colony Count: NO GROWTH
Culture: NO GROWTH

## 2013-04-06 LAB — STONE ANALYSIS: Stone Weight KSTONE: 0.008 g

## 2013-04-07 ENCOUNTER — Encounter: Payer: Self-pay | Admitting: *Deleted

## 2013-04-07 ENCOUNTER — Encounter: Payer: 59 | Attending: Obstetrics and Gynecology | Admitting: *Deleted

## 2013-04-07 VITALS — Ht 62.0 in | Wt 184.9 lb

## 2013-04-07 DIAGNOSIS — O24419 Gestational diabetes mellitus in pregnancy, unspecified control: Secondary | ICD-10-CM

## 2013-04-07 DIAGNOSIS — Z713 Dietary counseling and surveillance: Secondary | ICD-10-CM | POA: Insufficient documentation

## 2013-04-07 DIAGNOSIS — O99810 Abnormal glucose complicating pregnancy: Secondary | ICD-10-CM | POA: Insufficient documentation

## 2013-04-07 NOTE — Progress Notes (Signed)
Medical Nutrition Therapy:  Appt start time: 1415 end time:  1515.  Assessment:  Patient here today with gestational diabetes and weight loss during pregnancy. She had GDM with her first pregnancy and went through nutrition class. She has been trying to limit carb intake to 30 grams per meal. She checks her BG 4 times daily with fasting levels between 78-98, and 2 hr PP between upper 90s-115. She is [redacted] weeks pregnant and is down about 5 pounds from her prepregnancy weight. She did have morning sickness lasting until around 16-18 weeks. 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   MEDICATIONS: Glyburide at night   DIETARY INTAKE:   Usual eating pattern includes 3 meals and 3 snacks per day.  24-hr recall:  B ( AM): Oatmeal (whole milk), water  Snk ( AM): String cheese, yogurt, banana  L ( PM): Soup, 1/2 sandwich, water Snk ( PM): Cheree Ditto crackers/apple with peanut butter, milk D ( PM): Chicken, brown/wild rice, vegetables, water Snk ( PM): Fruit, cheese, yogurt, Honey Nut Cheerios, granola bar with peanut butter, nuts Beverages: Water, milk  Usual physical activity: Playing with toddler, walking at work  Estimated energy needs: 2200 calories 248 g carbohydrates 138 g protein 73 g fat  Progress Towards Goal(s):  In progress.   Nutritional Diagnosis:  -3.2 Unintentional weight loss As related to reduced energy intake.  As evidenced by 5 pound weight loss during pregnancy.    Intervention:  Nutrition counseling. Patient counseling on nutrition for adequate weight gain during pregnancy  Goals:  1. 1 pound weight gain per week.  2. Limit carb intake to 45 grams at meals, 15-30 grams at snacks 3. Increase intake of protein and healthy fats to meet calorie needs.   Handouts given during visit include:  GDM handout  Yellow meal plan card  Monitoring/Evaluation:  Dietary intake,  exercise, blood glucose, and body weight prn.

## 2013-04-13 ENCOUNTER — Ambulatory Visit: Payer: 59 | Admitting: *Deleted

## 2013-05-25 ENCOUNTER — Encounter (HOSPITAL_COMMUNITY): Payer: Self-pay

## 2013-05-25 ENCOUNTER — Inpatient Hospital Stay (HOSPITAL_COMMUNITY)
Admission: AD | Admit: 2013-05-25 | Discharge: 2013-05-25 | Disposition: A | Discharge status: Home / Self Care | Payer: 59 | Source: Ambulatory Visit | Attending: Obstetrics and Gynecology | Admitting: Obstetrics and Gynecology

## 2013-05-25 DIAGNOSIS — R110 Nausea: Secondary | ICD-10-CM

## 2013-05-25 DIAGNOSIS — O212 Late vomiting of pregnancy: Secondary | ICD-10-CM | POA: Insufficient documentation

## 2013-05-25 DIAGNOSIS — R197 Diarrhea, unspecified: Secondary | ICD-10-CM | POA: Insufficient documentation

## 2013-05-25 DIAGNOSIS — A084 Viral intestinal infection, unspecified: Secondary | ICD-10-CM

## 2013-05-25 DIAGNOSIS — O99810 Abnormal glucose complicating pregnancy: Secondary | ICD-10-CM | POA: Insufficient documentation

## 2013-05-25 LAB — GLUCOSE, CAPILLARY: Glucose-Capillary: 89 mg/dL (ref 70–99)

## 2013-05-25 MED ORDER — LOPERAMIDE HCL 2 MG PO CAPS
2.0000 mg | ORAL_CAPSULE | Freq: Once | ORAL | Status: AC
  Administered 2013-05-25: 2 mg via ORAL
  Filled 2013-05-25: qty 1

## 2013-05-25 MED ORDER — ONDANSETRON HCL 4 MG/2ML IJ SOLN
4.0000 mg | Freq: Once | INTRAMUSCULAR | Status: AC
  Administered 2013-05-25: 4 mg via INTRAVENOUS
  Filled 2013-05-25: qty 2

## 2013-05-25 MED ORDER — LACTATED RINGERS IV BOLUS (SEPSIS)
2000.0000 mL | Freq: Once | INTRAVENOUS | Status: AC
  Administered 2013-05-25 (×2): 1000 mL via INTRAVENOUS

## 2013-05-25 MED ORDER — ONDANSETRON HCL 4 MG PO TABS: 4.0000 mg | ORAL_TABLET | Freq: Three times a day (TID) | ORAL | Status: DC | PRN

## 2013-05-25 MED ORDER — ONDANSETRON HCL 4 MG/2ML IJ SOLN: 4.0000 mg | Freq: Once | INTRAMUSCULAR | Status: DC

## 2013-05-25 NOTE — Progress Notes (Signed)
Melanie bhambri cnm notified of patient, that her fluids is complete and patient is tolerating clear water. Order to discharge home with gastroenteritis. She will send zofran to her pharmacy

## 2013-05-25 NOTE — MAU Note (Signed)
Sent from office; failed NST at routine appointment. (gest diab).  Spec gravity elevated in urine at office- MD thought dehydrated- sent for fluids.  N/V/D started last night.  Daughter was sick a wk ago- brief.

## 2013-06-10 ENCOUNTER — Encounter (HOSPITAL_COMMUNITY): Payer: Self-pay

## 2013-06-10 ENCOUNTER — Inpatient Hospital Stay (HOSPITAL_COMMUNITY)
Admission: AD | Admit: 2013-06-10 | Discharge: 2013-06-10 | Disposition: A | Discharge status: Home / Self Care | Payer: 59 | Source: Ambulatory Visit | Attending: Obstetrics and Gynecology | Admitting: Obstetrics and Gynecology

## 2013-06-10 DIAGNOSIS — O99810 Abnormal glucose complicating pregnancy: Secondary | ICD-10-CM | POA: Insufficient documentation

## 2013-06-10 DIAGNOSIS — R109 Unspecified abdominal pain: Secondary | ICD-10-CM | POA: Insufficient documentation

## 2013-06-10 DIAGNOSIS — O4703 False labor before 37 completed weeks of gestation, third trimester: Secondary | ICD-10-CM

## 2013-06-10 DIAGNOSIS — O4700 False labor before 37 completed weeks of gestation, unspecified trimester: Secondary | ICD-10-CM | POA: Diagnosis present

## 2013-06-10 DIAGNOSIS — O479 False labor, unspecified: Secondary | ICD-10-CM | POA: Diagnosis present

## 2013-06-10 HISTORY — DX: Gestational diabetes mellitus in pregnancy, unspecified control: O24.419

## 2013-06-10 HISTORY — DX: False labor before 37 completed weeks of gestation, unspecified trimester: O47.00

## 2013-06-10 HISTORY — DX: False labor, unspecified: O47.9

## 2013-06-10 LAB — FETAL FIBRONECTIN: Fetal Fibronectin: NEGATIVE

## 2013-06-10 LAB — URINE MICROSCOPIC-ADD ON

## 2013-06-10 LAB — URINALYSIS, ROUTINE W REFLEX MICROSCOPIC
Bilirubin Urine: NEGATIVE
Ketones, ur: NEGATIVE mg/dL
Nitrite: NEGATIVE
Protein, ur: NEGATIVE mg/dL
pH: 6 (ref 5.0–8.0)

## 2013-06-10 NOTE — MAU Note (Signed)
Woke up at 4 am with intermittent low back pain & abdominal tightening. Denies LOF, VB or vaginal discharge. Positive fetal movement. Denies n/v/d or urinary symptoms.

## 2013-06-10 NOTE — MAU Provider Note (Signed)
History     CSN: [id number]  Arrival date and time: 06/10/13 0517 Provider notified: [phone] Provider at bedside: [phone]      Chief Complaint  Patient presents with  . Abdominal Cramping   HPI  [name] is a 27 yo G2P1001 at 34.[redacted] wks gestation by ultrasound.  She presents this morning with  complaints of contractions every 10 mins.since 2200.  She denies VB or LOF.  She reports (+) FM.  Her prenatal  course has been complicated by class A2 GDM on Glyburide 5 mg BID.  Past Medical History  Diagnosis Date  . Asthma   . IBS (irritable bowel syndrome)   . Ovarian cyst   . Kidney stones   . Diabetes in pregnancy   . Gestational diabetes     Past Surgical History  Procedure Laterality Date  . Mouth surgery      Family History  Problem Relation Age of Onset  . Hypertension Mother   . Hypothyroidism Mother   . Heart attack Mother   . Diabetes Mother   . Heart disease Mother     heart failure  . Hypertension Father   . Hypothyroidism Father   . Diabetes Father   . Cancer Maternal Grandmother     colon  . Early death Maternal Grandfather 58    ?pulmonary disease    History  Substance Use Topics  . Smoking status: Never Smoker   . Smokeless tobacco: Not on file  . Alcohol Use: No    Allergies:  Allergies  Allergen Reactions  . Vicodin [Hydrocodone-Acetaminophen]     Intolerance. Nausea and vomiting    Prescriptions prior to admission  Medication Sig Dispense Refill  . glucose blood test strip 1 each by Other route as needed for other. Use as instructed      . glyBURIDE (DIABETA) 2.5 MG tablet Take 2.5 mg by mouth daily before supper.      . glyBURIDE (DIABETA) 5 MG tablet Take 5 mg by mouth daily with breakfast.      . ondansetron (ZOFRAN) 4 MG tablet Take 1 tablet (4 mg total) by mouth every 8 (eight) hours as needed for nausea or vomiting.  20 tablet  0  . Prenatal Vit-Fe Fumarate-FA (PRENATAL MULTIVITAMIN) TABS tablet Take 1 tablet by mouth  daily at 12 noon.      Marland Kitchen oxyCODONE-acetaminophen (PERCOCET) 7.5-325 MG per tablet Take 1 tablet by mouth every 4 (four) hours as needed for pain.  20 tablet  0    Review of Systems  Constitutional: Negative.   HENT: Negative.   Eyes: Negative.   Respiratory: Negative.   Cardiovascular: Negative.   Gastrointestinal: Positive for abdominal pain.       Mild lower abdominal cramps  Genitourinary: Negative.   Musculoskeletal: Positive for back pain.       Mild since contractions started  Skin: Negative.   Neurological: Negative.   Psychiatric/Behavioral: Negative.    Results for orders placed during the hospital encounter of 06/10/13 (from the past 24 hour(s))  URINALYSIS, ROUTINE W REFLEX MICROSCOPIC     Status: Abnormal   Collection Time    06/10/13  5:17 AM      Result Value Range   Color, Urine YELLOW  YELLOW   APPearance CLEAR  CLEAR   Specific Gravity, Urine 1.010  1.005 - 1.030   pH 6.0  5.0 - 8.0   Glucose, UA 100 (*) NEGATIVE mg/dL   Hgb urine dipstick SMALL (*) NEGATIVE  Bilirubin Urine NEGATIVE  NEGATIVE   Ketones, ur NEGATIVE  NEGATIVE mg/dL   Protein, ur NEGATIVE  NEGATIVE mg/dL   Urobilinogen, UA 0.2  0.0 - 1.0 mg/dL   Nitrite NEGATIVE  NEGATIVE   Leukocytes, UA MODERATE (*) NEGATIVE  URINE MICROSCOPIC-ADD ON     Status: Abnormal   Collection Time    06/10/13  5:17 AM      Result Value Range   Squamous Epithelial / LPF MANY (*) RARE   WBC, UA 7-10  <3 WBC/hpf   RBC / HPF 3-6  <3 RBC/hpf   Bacteria, UA FEW (*) RARE  FETAL FIBRONECTIN     Status: None   Collection Time    06/10/13  7:04 AM      Result Value Range   Fetal Fibronectin NEGATIVE  NEGATIVE  GLUCOSE, CAPILLARY     Status: Abnormal   Collection Time    06/10/13  7:07 AM      Result Value Range   Glucose-Capillary 108 (*) 70 - 99 mg/dL  FHR: 409 bpm/moderate variability/accels present/no decels Toco: Occ UC's mild to palpation (previously 3-10 mins)  Physical Exam   Blood pressure 118/68,  pulse 98, temperature 98.7 F (37.1 C), temperature source Oral, resp. rate 18, height 5\' 2"  (1.575 m), weight 88.542 kg (195 lb 3.2 oz), last menstrual period 10/15/2012.  Physical Exam  Constitutional: She is oriented to person, place, and time. She appears well-developed and well-nourished.  HENT:  Head: Normocephalic and atraumatic.  Eyes: EOM are normal. Pupils are equal, round, and reactive to light.  Neck: Normal range of motion. Neck supple.  Cardiovascular: Normal rate, regular rhythm and normal heart sounds.   Respiratory: Effort normal and breath sounds normal.  GI: Soft. Bowel sounds are normal.  Genitourinary: Vagina normal and uterus normal.  Gravid  Musculoskeletal: Normal range of motion.  Neurological: She is alert and oriented to person, place, and time.  Skin: Skin is warm and dry.  Psychiatric: She has a normal mood and affect. Her behavior is normal. Judgment and thought content normal.  VE: closed/40%/high/posterior, soft  MAU Course  Procedures CCUA CEFM fFN CBG  Assessment and Plan  IUP @ [redacted] wks gestation GDM Class A2 - stable on glyburide Preterm contractions Category 1 FHR tracing   Discharge home Pelvic Rest Keep scheduled appointment on 12/30 Increase hydration Increase rest Out of work until 06/13/13  *Dr. Seymour Bars notified of plan - agrees  Kenard Gower, MSN, CNM 06/10/2013, 7:06 AM

## 2013-06-11 LAB — URINE CULTURE: Colony Count: 15000

## 2013-06-16 NOTE — L&D Delivery Note (Signed)
Delivery Note At 6:37 PM a viable and healthy female was delivered via Vaginal, Spontaneous Delivery (Presentation: Right Occiput Anterior).  APGAR: 9, 9; weight  pending.   Placenta status: Intact, Spontaneous.  Cord: 3 vessels with the following complications: None.  Cord pH: none  Anesthesia: Epidural  Episiotomy: None Lacerations: 2nd degree;Perineal;Vaginal;Sulcus Suture Repair: 3.0 chromic Est. Blood Loss (mL): 300  Mom to postpartum.  Baby to Couplet care / Skin to Skin.  Meer Reindl A 07/15/2013, 7:48 PM

## 2013-06-24 LAB — OB RESULTS CONSOLE GBS: GBS: NEGATIVE

## 2013-07-05 ENCOUNTER — Telehealth (HOSPITAL_COMMUNITY): Payer: Self-pay | Admitting: *Deleted

## 2013-07-05 ENCOUNTER — Other Ambulatory Visit: Payer: Self-pay | Admitting: Obstetrics and Gynecology

## 2013-07-05 ENCOUNTER — Encounter (HOSPITAL_COMMUNITY): Payer: Self-pay | Admitting: *Deleted

## 2013-07-05 NOTE — Telephone Encounter (Signed)
Preadmission screen  

## 2013-07-14 ENCOUNTER — Encounter (HOSPITAL_COMMUNITY): Payer: Self-pay | Admitting: *Deleted

## 2013-07-14 ENCOUNTER — Inpatient Hospital Stay (HOSPITAL_COMMUNITY)
Admission: AD | Admit: 2013-07-14 | Discharge: 2013-07-14 | Disposition: A | Discharge status: Home / Self Care | Payer: 59 | Source: Ambulatory Visit | Attending: Obstetrics and Gynecology | Admitting: Obstetrics and Gynecology

## 2013-07-14 DIAGNOSIS — O4700 False labor before 37 completed weeks of gestation, unspecified trimester: Secondary | ICD-10-CM

## 2013-07-14 DIAGNOSIS — O479 False labor, unspecified: Secondary | ICD-10-CM | POA: Insufficient documentation

## 2013-07-14 NOTE — Discharge Instructions (Signed)

## 2013-07-14 NOTE — MAU Note (Signed)
Pt reports UCs q 3-8 min getting stronger in intensity.  No leakage of fluid, good fetal movement.

## 2013-07-14 NOTE — Progress Notes (Signed)
Dr Garwin Brothers updated on pt UC pattern, SVE, and FHR.  Orders given to allow pt to ambulate for 1 hour.  RN to recheck pt at that time and notify MD.

## 2013-07-15 ENCOUNTER — Encounter (HOSPITAL_COMMUNITY): Payer: Self-pay

## 2013-07-15 ENCOUNTER — Encounter (HOSPITAL_COMMUNITY): Payer: 59 | Admitting: Anesthesiology

## 2013-07-15 ENCOUNTER — Inpatient Hospital Stay (HOSPITAL_COMMUNITY): Payer: 59 | Admitting: Anesthesiology

## 2013-07-15 ENCOUNTER — Inpatient Hospital Stay (HOSPITAL_COMMUNITY)
Admission: RE | Admit: 2013-07-15 | Discharge: 2013-07-17 | DRG: 775 | Disposition: A | Discharge status: Home / Self Care | Payer: 59 | Source: Ambulatory Visit | Attending: Obstetrics and Gynecology | Admitting: Obstetrics and Gynecology

## 2013-07-15 VITALS — BP 104/69 | HR 88 | Temp 97.7°F | Resp 16 | Ht 62.0 in | Wt 195.0 lb

## 2013-07-15 DIAGNOSIS — O99814 Abnormal glucose complicating childbirth: Principal | ICD-10-CM | POA: Diagnosis present

## 2013-07-15 DIAGNOSIS — O24419 Gestational diabetes mellitus in pregnancy, unspecified control: Secondary | ICD-10-CM

## 2013-07-15 DIAGNOSIS — O4700 False labor before 37 completed weeks of gestation, unspecified trimester: Secondary | ICD-10-CM

## 2013-07-15 DIAGNOSIS — K589 Irritable bowel syndrome without diarrhea: Secondary | ICD-10-CM | POA: Diagnosis present

## 2013-07-15 DIAGNOSIS — Z87442 Personal history of urinary calculi: Secondary | ICD-10-CM

## 2013-07-15 DIAGNOSIS — O479 False labor, unspecified: Secondary | ICD-10-CM

## 2013-07-15 LAB — TYPE AND SCREEN
ABO/RH(D): O POS
Antibody Screen: NEGATIVE

## 2013-07-15 LAB — GLUCOSE, CAPILLARY
GLUCOSE-CAPILLARY: 111 mg/dL — AB (ref 70–99)
GLUCOSE-CAPILLARY: 63 mg/dL — AB (ref 70–99)
Glucose-Capillary: 92 mg/dL (ref 70–99)
Glucose-Capillary: 105 mg/dL — ABNORMAL HIGH (ref 70–99)
Glucose-Capillary: 80 mg/dL (ref 70–99)
Glucose-Capillary: 125 mg/dL — ABNORMAL HIGH (ref 70–99)

## 2013-07-15 LAB — CBC
HCT: 39 % (ref 36.0–46.0)
HEMOGLOBIN: 13.2 g/dL (ref 12.0–15.0)
MCH: 27.8 pg (ref 26.0–34.0)
MCHC: 33.8 g/dL (ref 30.0–36.0)
MCV: 82.3 fL (ref 78.0–100.0)
PLATELETS: 144 10*3/uL — AB (ref 150–400)
RBC: 4.74 MIL/uL (ref 3.87–5.11)
RDW: 14.3 % (ref 11.5–15.5)
WBC: 8.9 10*3/uL (ref 4.0–10.5)

## 2013-07-15 LAB — RPR: RPR: NONREACTIVE

## 2013-07-15 LAB — ABO/RH: ABO/RH(D): O POS

## 2013-07-15 MED ORDER — PRENATAL MULTIVITAMIN CH
1.0000 | ORAL_TABLET | Freq: Every day | ORAL | Status: DC
  Administered 2013-07-16: 1 via ORAL
  Filled 2013-07-15: qty 1

## 2013-07-15 MED ORDER — OXYTOCIN 10 UNIT/ML IJ SOLN: 10.0000 [IU] | Freq: Once | INTRAMUSCULAR | Status: DC

## 2013-07-15 MED ORDER — FENTANYL 2.5 MCG/ML BUPIVACAINE 1/10 % EPIDURAL INFUSION (WH - ANES)
14.0000 mL/h | INTRAMUSCULAR | Status: DC | PRN
  Administered 2013-07-15: 14 mL/h via EPIDURAL
  Filled 2013-07-15 (×2): qty 125

## 2013-07-15 MED ORDER — CITRIC ACID-SODIUM CITRATE 334-500 MG/5ML PO SOLN: 30.0000 mL | ORAL | Status: DC | PRN

## 2013-07-15 MED ORDER — DIPHENHYDRAMINE HCL 50 MG/ML IJ SOLN: 12.5000 mg | INTRAMUSCULAR | Status: DC | PRN

## 2013-07-15 MED ORDER — DIBUCAINE 1 % RE OINT
1.0000 | TOPICAL_OINTMENT | RECTAL | Status: DC | PRN
Start: 2013-07-15 — End: 2013-07-17

## 2013-07-15 MED ORDER — BUTORPHANOL TARTRATE 1 MG/ML IJ SOLN
2.0000 mg | INTRAMUSCULAR | Status: DC | PRN
  Administered 2013-07-15: 2 mg via INTRAVENOUS
  Filled 2013-07-15: qty 2

## 2013-07-15 MED ORDER — LANOLIN HYDROUS EX OINT: TOPICAL_OINTMENT | CUTANEOUS | Status: DC | PRN

## 2013-07-15 MED ORDER — SODIUM BICARBONATE 8.4 % IV SOLN
INTRAVENOUS | Status: DC | PRN
  Administered 2013-07-15: 5 mL via EPIDURAL

## 2013-07-15 MED ORDER — EPHEDRINE 5 MG/ML INJ
10.0000 mg | INTRAVENOUS | Status: DC | PRN
  Administered 2013-07-15: 10 mg via INTRAVENOUS
  Filled 2013-07-15: qty 2

## 2013-07-15 MED ORDER — OXYTOCIN BOLUS FROM INFUSION
500.0000 mL | INTRAVENOUS | Status: DC
Start: 2013-07-15 — End: 2013-07-15
  Administered 2013-07-15: 500 mL via INTRAVENOUS

## 2013-07-15 MED ORDER — FERROUS SULFATE 325 (65 FE) MG PO TABS
325.0000 mg | ORAL_TABLET | Freq: Two times a day (BID) | ORAL | Status: DC
  Administered 2013-07-16 – 2013-07-17 (×3): 325 mg via ORAL
  Filled 2013-07-15 (×3): qty 1

## 2013-07-15 MED ORDER — PHENYLEPHRINE 40 MCG/ML (10ML) SYRINGE FOR IV PUSH (FOR BLOOD PRESSURE SUPPORT)
80.0000 ug | PREFILLED_SYRINGE | INTRAVENOUS | Status: DC | PRN
  Filled 2013-07-15: qty 2
  Filled 2013-07-15: qty 10

## 2013-07-15 MED ORDER — OXYTOCIN 40 UNITS IN LACTATED RINGERS INFUSION - SIMPLE MED
1.0000 m[IU]/min | INTRAVENOUS | Status: DC
  Administered 2013-07-15: 2 m[IU]/min via INTRAVENOUS
  Filled 2013-07-15: qty 1000

## 2013-07-15 MED ORDER — WITCH HAZEL-GLYCERIN EX PADS
1.0000 | MEDICATED_PAD | CUTANEOUS | Status: DC | PRN
Start: 2013-07-15 — End: 2013-07-17

## 2013-07-15 MED ORDER — ALBUTEROL SULFATE (2.5 MG/3ML) 0.083% IN NEBU: 3.0000 mL | INHALATION_SOLUTION | Freq: Four times a day (QID) | RESPIRATORY_TRACT | Status: DC | PRN

## 2013-07-15 MED ORDER — IBUPROFEN 600 MG PO TABS: 600.0000 mg | ORAL_TABLET | Freq: Four times a day (QID) | ORAL | Status: DC | PRN

## 2013-07-15 MED ORDER — LACTATED RINGERS IV SOLN
500.0000 mL | Freq: Once | INTRAVENOUS | Status: AC
  Administered 2013-07-15: 500 mL via INTRAVENOUS

## 2013-07-15 MED ORDER — DIPHENHYDRAMINE HCL 25 MG PO CAPS
25.0000 mg | ORAL_CAPSULE | Freq: Four times a day (QID) | ORAL | Status: DC | PRN
Start: 2013-07-15 — End: 2013-07-17

## 2013-07-15 MED ORDER — OXYTOCIN 40 UNITS IN LACTATED RINGERS INFUSION - SIMPLE MED: 62.5000 mL/h | INTRAVENOUS | Status: DC

## 2013-07-15 MED ORDER — SENNOSIDES-DOCUSATE SODIUM 8.6-50 MG PO TABS
2.0000 | ORAL_TABLET | ORAL | Status: DC
  Administered 2013-07-16 (×2): 2 via ORAL
  Filled 2013-07-15 (×2): qty 2

## 2013-07-15 MED ORDER — OXYCODONE-ACETAMINOPHEN 5-325 MG PO TABS: 1.0000 | ORAL_TABLET | ORAL | Status: DC | PRN

## 2013-07-15 MED ORDER — LACTATED RINGERS IV SOLN
INTRAVENOUS | Status: DC
  Administered 2013-07-15 (×2): via INTRAVENOUS

## 2013-07-15 MED ORDER — PHENYLEPHRINE 40 MCG/ML (10ML) SYRINGE FOR IV PUSH (FOR BLOOD PRESSURE SUPPORT)
80.0000 ug | PREFILLED_SYRINGE | INTRAVENOUS | Status: DC | PRN
  Filled 2013-07-15: qty 2

## 2013-07-15 MED ORDER — TERBUTALINE SULFATE 1 MG/ML IJ SOLN: 0.2500 mg | Freq: Once | INTRAMUSCULAR | Status: DC | PRN

## 2013-07-15 MED ORDER — ACETAMINOPHEN 325 MG PO TABS: 650.0000 mg | ORAL_TABLET | ORAL | Status: DC | PRN

## 2013-07-15 MED ORDER — ONDANSETRON HCL 4 MG/2ML IJ SOLN: 4.0000 mg | INTRAMUSCULAR | Status: DC | PRN

## 2013-07-15 MED ORDER — LIDOCAINE HCL (PF) 1 % IJ SOLN
30.0000 mL | INTRAMUSCULAR | Status: DC | PRN
  Administered 2013-07-15: 30 mL via SUBCUTANEOUS
  Filled 2013-07-15 (×2): qty 30

## 2013-07-15 MED ORDER — ZOLPIDEM TARTRATE 5 MG PO TABS: 5.0000 mg | ORAL_TABLET | Freq: Every evening | ORAL | Status: DC | PRN

## 2013-07-15 MED ORDER — LACTATED RINGERS IV SOLN
500.0000 mL | INTRAVENOUS | Status: DC | PRN
  Administered 2013-07-15: 500 mL via INTRAVENOUS

## 2013-07-15 MED ORDER — ONDANSETRON HCL 4 MG PO TABS: 4.0000 mg | ORAL_TABLET | ORAL | Status: DC | PRN

## 2013-07-15 MED ORDER — SIMETHICONE 80 MG PO CHEW
80.0000 mg | CHEWABLE_TABLET | ORAL | Status: DC | PRN
Start: 2013-07-15 — End: 2013-07-17

## 2013-07-15 MED ORDER — IBUPROFEN 600 MG PO TABS
600.0000 mg | ORAL_TABLET | Freq: Four times a day (QID) | ORAL | Status: DC
  Administered 2013-07-15 – 2013-07-17 (×6): 600 mg via ORAL
  Filled 2013-07-15 (×6): qty 1

## 2013-07-15 MED ORDER — ONDANSETRON HCL 4 MG/2ML IJ SOLN: 4.0000 mg | Freq: Four times a day (QID) | INTRAMUSCULAR | Status: DC | PRN

## 2013-07-15 MED ORDER — BENZOCAINE-MENTHOL 20-0.5 % EX AERO
1.0000 "application " | INHALATION_SPRAY | CUTANEOUS | Status: DC | PRN
  Administered 2013-07-15: 1 via TOPICAL
  Filled 2013-07-15: qty 56

## 2013-07-15 MED ORDER — OXYCODONE-ACETAMINOPHEN 5-325 MG PO TABS
1.0000 | ORAL_TABLET | ORAL | Status: DC | PRN
  Administered 2013-07-16 – 2013-07-17 (×4): 1 via ORAL
  Filled 2013-07-15 (×4): qty 1

## 2013-07-15 MED ORDER — EPHEDRINE 5 MG/ML INJ
10.0000 mg | INTRAVENOUS | Status: DC | PRN
  Filled 2013-07-15: qty 2
  Filled 2013-07-15: qty 4

## 2013-07-15 NOTE — Anesthesia Procedure Notes (Signed)

## 2013-07-15 NOTE — Progress Notes (Signed)
Patient up to stedy, became dizzy. Blood pressure taken, moved back to bed, fluid bolus started.

## 2013-07-15 NOTE — Anesthesia Preprocedure Evaluation (Signed)
Anesthesia Evaluation  Patient identified by MRN, date of birth, ID band Patient awake    Reviewed: Allergy & Precautions, H&P , Patient's Chart, lab work & pertinent test results  Airway Mallampati: II TM Distance: >3 FB Neck ROM: full    Dental  (+) Teeth Intact   Pulmonary  breath sounds clear to auscultation        Cardiovascular Rhythm:regular Rate:Normal     Neuro/Psych    GI/Hepatic   Endo/Other  diabetes, Gestational  Renal/GU      Musculoskeletal   Abdominal   Peds  Hematology   Anesthesia Other Findings       Reproductive/Obstetrics (+) Pregnancy                           Anesthesia Physical Anesthesia Plan  ASA: II  Anesthesia Plan: Epidural   Post-op Pain Management:    Induction:   Airway Management Planned:   Additional Equipment:   Intra-op Plan:   Post-operative Plan:   Informed Consent: I have reviewed the patients History and Physical, chart, labs and discussed the procedure including the risks, benefits and alternatives for the proposed anesthesia with the patient or authorized representative who has indicated his/her understanding and acceptance.   Dental Advisory Given  Plan Discussed with:   Anesthesia Plan Comments: (Labs checked- platelets confirmed with RN in room. Fetal heart tracing, per RN, reported to be stable enough for sitting procedure. Discussed epidural, and patient consents to the procedure:  included risk of possible headache,backache, failed block, allergic reaction, and nerve injury. This patient was asked if she had any questions or concerns before the procedure started.)        Anesthesia Quick Evaluation

## 2013-07-15 NOTE — Progress Notes (Signed)
S: comfortable  O:  Pitocin Epidural VE: 6 cm/80%/-2 Tracing: baseline 130 (+) accel Ctx q 2-3 mins  BS 105 IMP: Class A2 GDM Term gestation  P) cont present mgmt

## 2013-07-15 NOTE — H&P (Signed)
Abigail Frank is a 28 y.o. female presenting @ 39 weeks for IOL 2nd to Class A2 GDM. [place] otherwise uncomplicated  Maternal Medical History:  Reason for admission: Contractions.   Contractions: Frequency: irregular.   Perceived severity is mild.    Fetal activity: Perceived fetal activity is normal.    Prenatal Complications - Diabetes: gestational. Diabetes is managed by oral agent (monotherapy).      OB History   Grav Para Term Preterm Abortions TAB SAB Ect Mult Living   2 1 1  0 0 0 0 0 0 1     Past Medical History  Diagnosis Date  . Asthma   . IBS (irritable bowel syndrome)   . Ovarian cyst   . Kidney stones   . Diabetes in pregnancy   . Gestational diabetes   . Preterm uterine contractions 06/10/2013  . Hx of varicella    Past Surgical History  Procedure Laterality Date  . Mouth surgery     Family History: family history includes Cancer in her maternal grandmother; Diabetes in her father and mother; Early death (age of onset: 72) in her maternal grandfather; Heart attack in her mother; Heart disease in her mother; Hypertension in her father and mother; Hypothyroidism in her father and mother. Social History:  reports that she has never smoked. She has never used smokeless tobacco. She reports that she does not drink alcohol or use illicit drugs.   Prenatal Transfer Tool  Maternal Diabetes: Yes:  Diabetes Type:  Insulin/Medication controlled Genetic Screening: Declined Maternal Ultrasounds/Referrals: Normal Fetal Ultrasounds or other Referrals:  Fetal echo Maternal Substance Abuse:  No Significant Maternal Medications:  Meds include: Other: glyburide Significant Maternal Lab Results:  Lab values include: Group B Strep negative Other Comments:  None  Review of Systems  Cardiovascular: Positive for leg swelling.  All other systems reviewed and are negative.      Last menstrual period 10/15/2012. Maternal Exam:  Uterine Assessment: Contraction frequency  is irregular.   Abdomen: Patient reports no abdominal tenderness. Estimated fetal weight is 7lb.   Fetal presentation: vertex  Pelvis: adequate for delivery.   Cervix: Cervix evaluated by digital exam.     Physical Exam  Constitutional: She is oriented to person, place, and time. She appears well-developed and well-nourished.  HENT:  Head: Normocephalic.  Eyes: EOM are normal.  Neck: Neck supple.  Cardiovascular: Regular rhythm.   Respiratory: Breath sounds normal.  GI: Soft.  Musculoskeletal: She exhibits edema.  Neurological: She is alert and oriented to person, place, and time.  Skin: Skin is warm and dry.  Psychiatric: She has a normal mood and affect.   VE 4/50/-2  Tracing: baseline 145 (+) accels (+) ctx  Prenatal labs: ABO, Rh: O/Positive/-- (06/27 0000) Antibody: Negative (06/27 0000) Rubella: Immune (06/27 0000) RPR: Nonreactive (06/27 0000)  HBsAg: Negative (06/27 0000)  HIV: Non-reactive (06/27 0000)  GBS: Negative (01/09 0000)   Assessment/Plan: Class A2 GDM Term gestation P) admit routine labs pitocin. Amniotomy prn. BS q 2 hours. Analgesic prn   Abigail Frank A 07/15/2013, 7:09 AM

## 2013-07-15 NOTE — Lactation Note (Signed)
This note was copied from the chart of Abigail Laurenashley Meggett.  Lactation Consultation Note Initial visit at 4 hours of age.  Mom reports problems with older child and poor latch, mom pumped.  Baby just fed for about 15 minutes mom reports a little pain.  Oral assessment reveal tight anterior frenulum with limited mobility and no tongue extension over lower gums observed.  Hand pump used with minimal results.  Mom fitted with #20 nipple shield to assist with latch.  Baby now asleep and latch with nipple shield not observed.  Report to mbu rn to check nipple shield size with next feeding.  Encouraged mom to continue to attempt latch without shield if she can and will follow up with out pt. Appt. As needed.  Lc to follow tomorrow.  [facility] LC resources given and discussed.  Encouraged skin to skin and cue based feedings. Feeding frequency discussed.  Mom to call for assist with next feeding.    Patient Name: Abigail Frank [id number] Date: 07/15/2013 Reason for consult: Initial assessment   Maternal Data Formula Feeding for Exclusion: No Has patient been taught Hand Expression?: Yes Does the patient have breastfeeding experience prior to this delivery?: Yes  Feeding Feeding Type: Breast Fed Length of feed:  (few sucks)  LATCH Score/Interventions Latch: Repeated attempts needed to sustain latch, nipple held in mouth throughout feeding, stimulation needed to elicit sucking reflex. Intervention(s): Adjust position;Assist with latch;Breast compression  Audible Swallowing: None Intervention(s): Skin to skin;Hand expression  Type of Nipple: Flat Intervention(s): Hand pump  Comfort (Breast/Nipple): Soft / non-tender     Hold (Positioning): No assistance needed to correctly position infant at breast. Intervention(s): Skin to skin;Support Pillows;Breastfeeding basics reviewed  LATCH Score: 6  Lactation Tools Discussed/Used Tools: Nipple Shields Nipple shield size: 20   Consult  Status Consult Status: Follow-up Date: 07/16/13 Follow-up type: In-patient    Justice Britain 07/15/2013, 11:24 PM

## 2013-07-15 NOTE — Progress Notes (Signed)
S: notes ctx  O:  Pitocin 2 MIU BP 120/55 VE 4/60/-2 AROM clear fluid  Tracing: baseline 150 small accels Ctx q 2-5 mins  BS 111 IMP: class A2 GDM Term gestation P) cont BS monitoring. Exaggerated right sims. Increase pitocin. epidural prn

## 2013-07-16 ENCOUNTER — Encounter (HOSPITAL_COMMUNITY): Payer: Self-pay

## 2013-07-16 LAB — CBC
HEMATOCRIT: 32.4 % — AB (ref 36.0–46.0)
Hemoglobin: 10.7 g/dL — ABNORMAL LOW (ref 12.0–15.0)
MCH: 27.4 pg (ref 26.0–34.0)
MCHC: 33 g/dL (ref 30.0–36.0)
MCV: 83.1 fL (ref 78.0–100.0)
Platelets: 145 10*3/uL — ABNORMAL LOW (ref 150–400)
RBC: 3.9 MIL/uL (ref 3.87–5.11)
RDW: 14.4 % (ref 11.5–15.5)
WBC: 15.1 10*3/uL — AB (ref 4.0–10.5)

## 2013-07-16 NOTE — Progress Notes (Signed)
Patient ID: Abigail Frank, female   DOB: 1985/10/08, 28 y.o.   MRN: [id number] PPD # 1 SVD  S:  Reports feeling a little sore, but well             Tolerating po/ No nausea or vomiting             Bleeding is light             Pain controlled with ibuprofen (OTC)             Up ad lib / ambulatory / voiding without difficulties    Newborn  Information for the patient's newborn:  Naomii, Kreger Girl Clarity [[id number]]  female  breast feeding   O:  A & O x 3, in no apparent distress              VS:  Filed Vitals:   07/15/13 2150 07/15/13 2250 07/16/13 0245 07/16/13 0514  BP: 94/64 119/59 96/57 98/63   Pulse: 136 117 96 86  Temp: 98.1 F (36.7 C) 98.3 F (36.8 C) 97.7 F (36.5 C) 97.7 F (36.5 C)  TempSrc: Oral Oral Oral Oral  Resp: 20 20 20 18   Height:      Weight:      SpO2: 97%       LABS:  Recent Labs  07/15/13 0912 07/16/13 0550  WBC 8.9 15.1*  HGB 13.2 10.7*  HCT 39.0 32.4*  PLT 144* 145*    Blood type: --/--/O POS, O POS (01/30 0912)  Rubella: Immune (06/27 0000)   I&O: I/O last 3 completed shifts: In: -  Out: 1200 [Urine:900; Blood:300]             Lungs: Clear and unlabored  Heart: regular rate and rhythm / no murmurs  Abdomen: soft, non-tender, non-distended, normal bowel sounds             Fundus: firm, non-tender, U-1  Perineum: 2nd degree vaginal repair is healing well  Lochia: minimal  Extremities: no edema, no calf pain or tenderness, no Homans    A/P: PPD # 1  28 y.o., [id number]   Principal Problem:   Postpartum care following vaginal delivery (1/30) Active Problems:   Gestational diabetes mellitus, class A2   Doing well - stable status  Routine post partum orders  Anticipate discharge tomorrow    Laury Deep, M, MSN, CNM 07/16/2013, 10:27 AM

## 2013-07-16 NOTE — Anesthesia Postprocedure Evaluation (Signed)
  Anesthesia Post-op Note  Patient: Abigail Frank  Procedure(s) Performed: * No procedures listed *  Patient Location: Mother/Baby  Anesthesia Type:Epidural  Level of Consciousness: awake, alert , oriented and patient cooperative    Post-op Pain: none  Post-op Assessment: Patient's Cardiovascular Status Stable, Respiratory Function Stable, Patent Airway, No signs of Nausea or vomiting, Adequate PO intake, Pain level controlled, No headache, No backache, No residual numbness and No residual motor weakness  Post-op Vital Signs: Reviewed and stable  Complications: No apparent anesthesia complications

## 2013-07-17 MED ORDER — OXYCODONE-ACETAMINOPHEN 5-325 MG PO TABS: 1.0000 | ORAL_TABLET | ORAL | Status: DC | PRN

## 2013-07-17 MED ORDER — DOCUSATE SODIUM 100 MG PO CAPS: 100.0000 mg | ORAL_CAPSULE | Freq: Two times a day (BID) | ORAL | Status: AC | PRN

## 2013-07-17 MED ORDER — IBUPROFEN 600 MG PO TABS: 600.0000 mg | ORAL_TABLET | Freq: Four times a day (QID) | ORAL | Status: DC

## 2013-07-17 MED ORDER — FERROUS SULFATE 325 (65 FE) MG PO TABS: 325.0000 mg | ORAL_TABLET | Freq: Two times a day (BID) | ORAL | Status: DC

## 2013-07-17 NOTE — Discharge Summary (Signed)
Obstetric Discharge Summary Reason for Admission: G2 P1 0 0 1 for IOL @ 39wks/GDM A2 Prenatal Procedures: NST and ultrasound Intrapartum Procedures: spontaneous vaginal delivery Postpartum Procedures: none Complications-Operative and Postpartum: 2nd degree perineal laceration Hemoglobin  Date Value Range Status  07/16/2013 10.7* 12.0 - 15.0 g/dL Final     REPEATED TO VERIFY     DELTA CHECK NOTED     HCT  Date Value Range Status  07/16/2013 32.4* 36.0 - 46.0 % Final    Physical Exam:  General: alert, cooperative and no distress Lochia: appropriate Uterine Fundus: firm Incision: n/a DVT Evaluation: No evidence of DVT seen on physical exam. Negative Homan's sign.  Discharge Diagnoses: G2 P2 s/p SVD with 2nd deg repair; GDM A2, stable  Discharge Information: Date: 07/17/2013 Activity: pelvic rest Diet: Low carb modified diet; urged increased healthy proteins and vegetables. Medications: PNV, Ibuprofen, Colace, Iron and Percocet Condition: stable Instructions: refer to practice specific booklet Discharge to: home Follow-up Information   Follow up with COUSINS,SHERONETTE A, MD In 6 weeks.   Specialty:  Obstetrics and Gynecology   Contact information:   [address] [place] [address] [phone]       Newborn Data: Live born female on 07/15/13 Birth Weight: 8 lb 2.2 oz (3691 g) APGAR: 9, 9  Home with mother.  Letta Cargile K 07/17/2013, 10:18 AM

## 2013-07-17 NOTE — Progress Notes (Signed)
Patient ID: Abigail Frank, female   DOB: June 27, 1985, 28 y.o.   MRN: [id number] PPD # 2  Subjective: Pt reports feeling well and eager for d/c home/ Pain controlled with ibuprofen and percocet Tolerating po/ Voiding without problems/ No n/v Bleeding is light/ Newborn info:  Information for the patient's newborn:  Frank, Girl Abigail [[id number]]  female Feeding: breast    Objective:  VS: Blood pressure 104/69, pulse 88, temperature 97.7 F (36.5 C), temperature source Oral, resp. rate 16  Recent Labs  07/15/13 0912 07/16/13 0550  WBC 8.9 15.1*  HGB 13.2 10.7*  HCT 39.0 32.4*  PLT 144* 145*    Blood type: /O POS Rubella: Immune    Physical Exam:  General:  alert, cooperative and no distress CV: Regular rate and rhythm Resp: clear Abdomen: soft, nontender, normal bowel sounds Uterine Fundus: firm, below umbilicus, nontender Perineum: healing with good reapproximation Lochia: minimal Ext: edema trace and Homans sign is negative, no sign of DVT    A/P: PPD # 2/ G2P2002/ S/P: SVD w/ 2nd deg repair with vaginal sulcus tear Hx GDM A2; stable; will check FBS at home; low carb diet encouraged Doing well and stable for discharge home RX: Ibuprofen 600mg  po Q 6 hrs prn pain #30 Refill x 1 Niferex 150mg  po QD/BID #30/#60 Refill x 1 Percocet 5/325 1 to 2 po Q 4 hrs prn pain #10 No refill Colace 100mg  po up to TID prn #30 Ref x 1 WOB/GYN booklet given Routine pp visit in [place], MSN, [facility] 07/17/2013, 10:00 AM

## 2013-07-19 ENCOUNTER — Ambulatory Visit (HOSPITAL_COMMUNITY): Payer: 59

## 2013-08-01 ENCOUNTER — Ambulatory Visit (HOSPITAL_COMMUNITY): Admission: RE | Admit: 2013-08-01 | Payer: 59 | Source: Ambulatory Visit

## 2013-08-05 ENCOUNTER — Ambulatory Visit (HOSPITAL_COMMUNITY): Payer: 59 | Attending: Obstetrics and Gynecology

## 2014-04-17 ENCOUNTER — Encounter (HOSPITAL_COMMUNITY): Payer: Self-pay

## 2015-06-20 ENCOUNTER — Other Ambulatory Visit (HOSPITAL_COMMUNITY): Payer: Self-pay | Admitting: Sports Medicine

## 2015-06-20 DIAGNOSIS — M542 Cervicalgia: Secondary | ICD-10-CM

## 2015-06-29 ENCOUNTER — Ambulatory Visit (HOSPITAL_COMMUNITY)
Admission: RE | Admit: 2015-06-29 | Discharge: 2015-06-29 | Disposition: A | Discharge status: Home / Self Care | Payer: 59 | Source: Ambulatory Visit | Attending: Sports Medicine | Admitting: Sports Medicine

## 2015-06-29 DIAGNOSIS — M542 Cervicalgia: Secondary | ICD-10-CM | POA: Insufficient documentation

## 2015-06-29 DIAGNOSIS — M4602 Spinal enthesopathy, cervical region: Secondary | ICD-10-CM | POA: Insufficient documentation

## 2015-06-29 DIAGNOSIS — M4802 Spinal stenosis, cervical region: Secondary | ICD-10-CM | POA: Insufficient documentation

## 2015-10-01 DIAGNOSIS — J029 Acute pharyngitis, unspecified: Secondary | ICD-10-CM | POA: Diagnosis not present

## 2015-10-05 DIAGNOSIS — H6691 Otitis media, unspecified, right ear: Secondary | ICD-10-CM | POA: Diagnosis not present

## 2015-10-05 DIAGNOSIS — Z6831 Body mass index (BMI) 31.0-31.9, adult: Secondary | ICD-10-CM | POA: Diagnosis not present

## 2015-10-05 DIAGNOSIS — J040 Acute laryngitis: Secondary | ICD-10-CM | POA: Diagnosis not present

## 2015-10-05 DIAGNOSIS — J209 Acute bronchitis, unspecified: Secondary | ICD-10-CM | POA: Diagnosis not present

## 2015-10-24 DIAGNOSIS — N390 Urinary tract infection, site not specified: Secondary | ICD-10-CM | POA: Diagnosis not present

## 2015-10-24 DIAGNOSIS — R8299 Other abnormal findings in urine: Secondary | ICD-10-CM | POA: Diagnosis not present

## 2015-10-24 DIAGNOSIS — R7302 Impaired glucose tolerance (oral): Secondary | ICD-10-CM | POA: Diagnosis not present

## 2015-10-24 DIAGNOSIS — Z0000 Encounter for general adult medical examination without abnormal findings: Secondary | ICD-10-CM | POA: Diagnosis not present

## 2015-10-31 DIAGNOSIS — Z0000 Encounter for general adult medical examination without abnormal findings: Secondary | ICD-10-CM | POA: Diagnosis not present

## 2015-10-31 DIAGNOSIS — F430 Acute stress reaction: Secondary | ICD-10-CM | POA: Diagnosis not present

## 2015-10-31 DIAGNOSIS — J302 Other seasonal allergic rhinitis: Secondary | ICD-10-CM | POA: Diagnosis not present

## 2015-11-03 DIAGNOSIS — R05 Cough: Secondary | ICD-10-CM | POA: Diagnosis not present

## 2015-11-03 DIAGNOSIS — J45998 Other asthma: Secondary | ICD-10-CM | POA: Diagnosis not present

## 2015-11-06 ENCOUNTER — Encounter (HOSPITAL_COMMUNITY): Payer: Self-pay | Admitting: Emergency Medicine

## 2015-11-06 ENCOUNTER — Emergency Department (HOSPITAL_COMMUNITY)
Admission: EM | Admit: 2015-11-06 | Discharge: 2015-11-06 | Disposition: A | Discharge status: Home / Self Care | Payer: 59 | Attending: Emergency Medicine | Admitting: Emergency Medicine

## 2015-11-06 DIAGNOSIS — Z79899 Other long term (current) drug therapy: Secondary | ICD-10-CM | POA: Insufficient documentation

## 2015-11-06 DIAGNOSIS — Z8619 Personal history of other infectious and parasitic diseases: Secondary | ICD-10-CM | POA: Insufficient documentation

## 2015-11-06 DIAGNOSIS — J45909 Unspecified asthma, uncomplicated: Secondary | ICD-10-CM | POA: Diagnosis not present

## 2015-11-06 DIAGNOSIS — Z7952 Long term (current) use of systemic steroids: Secondary | ICD-10-CM | POA: Diagnosis not present

## 2015-11-06 DIAGNOSIS — K625 Hemorrhage of anus and rectum: Secondary | ICD-10-CM | POA: Diagnosis not present

## 2015-11-06 DIAGNOSIS — Z8742 Personal history of other diseases of the female genital tract: Secondary | ICD-10-CM | POA: Insufficient documentation

## 2015-11-06 DIAGNOSIS — Z7951 Long term (current) use of inhaled steroids: Secondary | ICD-10-CM | POA: Insufficient documentation

## 2015-11-06 DIAGNOSIS — Z87442 Personal history of urinary calculi: Secondary | ICD-10-CM | POA: Diagnosis not present

## 2015-11-06 DIAGNOSIS — Z8632 Personal history of gestational diabetes: Secondary | ICD-10-CM | POA: Insufficient documentation

## 2015-11-06 DIAGNOSIS — R531 Weakness: Secondary | ICD-10-CM | POA: Diagnosis not present

## 2015-11-06 LAB — POC OCCULT BLOOD, ED: FECAL OCCULT BLD: NEGATIVE

## 2015-11-06 LAB — CBC
HCT: 40.3 % (ref 36.0–46.0)
HEMOGLOBIN: 12.9 g/dL (ref 12.0–15.0)
MCH: 27.3 pg (ref 26.0–34.0)
MCHC: 32 g/dL (ref 30.0–36.0)
MCV: 85.2 fL (ref 78.0–100.0)
PLATELETS: 250 10*3/uL (ref 150–400)
RBC: 4.73 MIL/uL (ref 3.87–5.11)
RDW: 13.5 % (ref 11.5–15.5)
WBC: 7.5 10*3/uL (ref 4.0–10.5)

## 2015-11-06 LAB — COMPREHENSIVE METABOLIC PANEL
ALK PHOS: 71 U/L (ref 38–126)
ALT: 17 U/L (ref 14–54)
ANION GAP: 6 (ref 5–15)
AST: 17 U/L (ref 15–41)
Albumin: 3.4 g/dL — ABNORMAL LOW (ref 3.5–5.0)
BUN: 12 mg/dL (ref 6–20)
CALCIUM: 8.9 mg/dL (ref 8.9–10.3)
CHLORIDE: 108 mmol/L (ref 101–111)
CO2: 25 mmol/L (ref 22–32)
CREATININE: 0.64 mg/dL (ref 0.44–1.00)
Glucose, Bld: 98 mg/dL (ref 65–99)
Potassium: 3.8 mmol/L (ref 3.5–5.1)
SODIUM: 139 mmol/L (ref 135–145)
Total Bilirubin: 0.5 mg/dL (ref 0.3–1.2)
Total Protein: 6.1 g/dL — ABNORMAL LOW (ref 6.5–8.1)

## 2015-11-06 LAB — TYPE AND SCREEN
ABO/RH(D): O POS
Antibody Screen: NEGATIVE

## 2015-11-06 LAB — ABO/RH: ABO/RH(D): O POS

## 2015-11-06 MED ORDER — SODIUM CHLORIDE 0.9 % IV BOLUS (SEPSIS)
1000.0000 mL | Freq: Once | INTRAVENOUS | Status: AC
Start: 2015-11-06 — End: 2015-11-06
  Administered 2015-11-06: 1000 mL via INTRAVENOUS

## 2015-11-06 NOTE — ED Notes (Signed)
Pt reports 2 bloody stools this morning with some "mucus" present, pt reports hx of IBS. Pt denies dizziness or lightheadedness but reports some abdominal cramping.

## 2015-11-06 NOTE — ED Provider Notes (Signed)
CSN: [id number]     Arrival date & time 11/06/15  0845 History   First MD Initiated Contact with Patient 11/06/15 0930     Chief Complaint  Patient presents with  . Rectal Bleeding     (Consider location/radiation/quality/duration/timing/severity/associated sxs/prior Treatment) HPI  Abigail Frank is a(n) 30 y.o. female who presents With chief complaint of bright red blood per rectum. The patient states that she's recently had URI symptoms and has been on a azithromycin. She's had some loose stools which is attributed to taking her antibiotic. Today she felt as though she needed to make a bowel movement and use the restroom. When she looked back in the toilet. She saw that the toilet was full of blood and clots. She did notice mucus in the stool. She had a second episode and then came to the emergency department for evaluation. She is a Marine scientist in the SICU. Patient states that she does not have a history of hemorrhoids. She has some minimal abdominal cramping, but denies severe pain, nausea, vomiting. She states that several years ago she had a similar episode and the CT scan showed colitis. However, when she followed up with the gastroenterologist. Her results were equivocal for inflammatory bowel disease. She states she has not had any episodes since that time.  Past Medical History  Diagnosis Date  . Asthma   . IBS (irritable bowel syndrome)   . Ovarian cyst   . Kidney stones   . Diabetes in pregnancy ([place])   . Gestational diabetes   . Preterm uterine contractions 06/10/2013  . Hx of varicella   . Postpartum care following vaginal delivery (1/30) 07/15/2013   Past Surgical History  Procedure Laterality Date  . Mouth surgery     Family History  Problem Relation Age of Onset  . Hypertension Mother   . Hypothyroidism Mother   . Heart attack Mother   . Diabetes Mother   . Heart disease Mother     heart failure  . Hypertension Father   . Hypothyroidism Father   . Diabetes  Father   . Cancer Maternal Grandmother     colon  . Early death Maternal Grandfather 33    ?pulmonary disease   Social History  Substance Use Topics  . Smoking status: Never Smoker   . Smokeless tobacco: Never Used  . Alcohol Use: No   OB History    Gravida Para Term Preterm AB TAB SAB Ectopic Multiple Living   2 2 2  0 0 0 0 0 0 2     Review of Systems  Ten systems reviewed and are negative for acute change, except as noted in the HPI.    Allergies  Bactrim and Vicodin  Home Medications   Prior to Admission medications   Medication Sig Start Date End Date Taking? Authorizing Provider  albuterol (PROVENTIL HFA;VENTOLIN HFA) 108 (90 BASE) MCG/ACT inhaler Inhale 2 puffs into the lungs every 6 (six) hours as needed for wheezing or shortness of breath.   Yes Historical Provider, MD  ibuprofen (ADVIL,MOTRIN) 600 MG tablet Take 1 tablet (600 mg total) by mouth every 6 (six) hours. Patient taking differently: Take 600 mg by mouth every 6 (six) hours as needed (pain).  07/17/13  Yes Gustavo Lah, NP  loratadine (CLARITIN) 10 MG tablet Take 10 mg by mouth daily.   Yes Historical Provider, MD  mometasone [facility]) 220 MCG/INH inhaler Inhale 2 puffs into the lungs daily.   Yes Historical Provider, MD  predniSONE (DELTASONE) 5  MG tablet Take 5 mg by mouth daily with breakfast.   Yes Historical Provider, MD  azithromycin (ZITHROMAX) 250 MG tablet Take by mouth daily.    Historical Provider, MD  glucose blood test strip 1 each by Other route as needed for other. Use as instructed    Historical Provider, MD   BP 115/69 mmHg  Pulse 82  Temp(Src) 98.1 F (36.7 C) (Oral)  Resp 18  SpO2 97%  LMP 10/30/2015 (Exact Date) Physical Exam  Constitutional: She is oriented to person, place, and time. She appears well-developed and well-nourished. No distress.  HENT:  Head: Normocephalic and atraumatic.  Eyes: Conjunctivae are normal. No scleral icterus.  Neck: Normal range of motion.   Cardiovascular: Normal rate, regular rhythm and normal heart sounds.  Exam reveals no gallop and no friction rub.   No murmur heard. Pulmonary/Chest: Effort normal and breath sounds normal. No respiratory distress.  Abdominal: Soft. Bowel sounds are normal. She exhibits no distension and no mass. There is tenderness (minimal, diffuse discomfort). There is no guarding.  Genitourinary:  Digital Rectal Exam reveals sphincter with good tone. No external hemorrhoids. No masses or fissures. No stool present. Small streak of BRPR  Neurological: She is alert and oriented to person, place, and time.  Skin: Skin is warm and dry. She is not diaphoretic.  Nursing note and vitals reviewed.   ED Course  Procedures (including critical care time) Labs Review Labs Reviewed  COMPREHENSIVE METABOLIC PANEL - Abnormal; Notable for the following:    Total Protein 6.1 (*)    Albumin 3.4 (*)    All other components within normal limits  CBC  POC OCCULT BLOOD, ED  TYPE AND SCREEN  ABO/RH    Imaging Review No results found. I have personally reviewed and evaluated these images and lab results as part of my medical decision-making.   EKG Interpretation None      MDM   Final diagnoses:  Bright red rectal bleeding    Patient labs are stable. Her rectal exam reveals scant streaks of blood, which tested negative on her occult stool card. Stable vital signs. No repeat episodes of bright red bleeding. Feel like the patient can follow up with gastroenterology, does not need further evaluation at this time. Discussed return precautions to include repeated the lumen. There is bleeding from the rectum, symptoms of acute blood loss including dizziness, shortness of breath, weakness. Severe abdominal pain, nausea or vomiting. Patient expresses her understanding and agrees with the plan of care. She presents today for discharge at this time    Margarita Mail, PA-C 11/06/15 [place], MD 11/08/15  [phone]

## 2015-11-06 NOTE — Discharge Instructions (Signed)

## 2015-11-10 ENCOUNTER — Emergency Department (HOSPITAL_BASED_OUTPATIENT_CLINIC_OR_DEPARTMENT_OTHER): Payer: 59

## 2015-11-10 ENCOUNTER — Encounter (HOSPITAL_BASED_OUTPATIENT_CLINIC_OR_DEPARTMENT_OTHER): Payer: Self-pay | Admitting: Emergency Medicine

## 2015-11-10 ENCOUNTER — Emergency Department (HOSPITAL_BASED_OUTPATIENT_CLINIC_OR_DEPARTMENT_OTHER)
Admission: EM | Admit: 2015-11-10 | Discharge: 2015-11-11 | Disposition: A | Discharge status: Home / Self Care | Payer: 59 | Attending: Emergency Medicine | Admitting: Emergency Medicine

## 2015-11-10 DIAGNOSIS — R0781 Pleurodynia: Secondary | ICD-10-CM | POA: Diagnosis not present

## 2015-11-10 DIAGNOSIS — Z79899 Other long term (current) drug therapy: Secondary | ICD-10-CM | POA: Insufficient documentation

## 2015-11-10 DIAGNOSIS — J45909 Unspecified asthma, uncomplicated: Secondary | ICD-10-CM | POA: Insufficient documentation

## 2015-11-10 DIAGNOSIS — Z8632 Personal history of gestational diabetes: Secondary | ICD-10-CM | POA: Diagnosis not present

## 2015-11-10 NOTE — ED Notes (Signed)
Patient is having right side  / rib pain after a massage

## 2015-11-11 DIAGNOSIS — R0781 Pleurodynia: Secondary | ICD-10-CM | POA: Diagnosis not present

## 2015-11-11 MED ORDER — TRAMADOL HCL 50 MG PO TABS: 50.0000 mg | ORAL_TABLET | Freq: Four times a day (QID) | ORAL | Status: DC | PRN

## 2015-11-11 MED ORDER — METHOCARBAMOL 500 MG PO TABS: 1000.0000 mg | ORAL_TABLET | Freq: Four times a day (QID) | ORAL | Status: DC

## 2015-11-11 NOTE — ED Notes (Addendum)
Pt seen by EDPA prior to RN assessment, see PA notes, orders received and initiated, pt alert, NAD, calm, interactive, resps e/u, speaking in clear complete sentences, laughing, no dyspnea noted, to xray via stretcher at this time.

## 2015-11-11 NOTE — ED Provider Notes (Signed)
CSN: [id number]     Arrival date & time 11/10/15  2251 History   First MD Initiated Contact with Patient 11/10/15 2326     Chief Complaint  Patient presents with  . Rib Injury     (Consider location/radiation/quality/duration/timing/severity/associated sxs/prior Treatment) HPI Comments: Patient presents with complaint of acute onset right-sided rib pain. Patient states that she came home from work and her back was hurting so her husband was massaging her back. Patient states that he pushed on her right ribs and she felt a pop and began having severe pain. She took a leftover Percocet from 2 and half years ago due to the pain. Pain is worse with palpation, movement, and deep breathing. No cough or shortness of breath. No fevers. No other injuries reported. Course is constant.  The history is provided by the patient.    Past Medical History  Diagnosis Date  . Asthma   . IBS (irritable bowel syndrome)   . Ovarian cyst   . Kidney stones   . Diabetes in pregnancy ([place])   . Gestational diabetes   . Preterm uterine contractions 06/10/2013  . Hx of varicella   . Postpartum care following vaginal delivery (1/30) 07/15/2013   Past Surgical History  Procedure Laterality Date  . Mouth surgery     Family History  Problem Relation Age of Onset  . Hypertension Mother   . Hypothyroidism Mother   . Heart attack Mother   . Diabetes Mother   . Heart disease Mother     heart failure  . Hypertension Father   . Hypothyroidism Father   . Diabetes Father   . Cancer Maternal Grandmother     colon  . Early death Maternal Grandfather 3    ?pulmonary disease   Social History  Substance Use Topics  . Smoking status: Never Smoker   . Smokeless tobacco: Never Used  . Alcohol Use: No   OB History    Gravida Para Term Preterm AB TAB SAB Ectopic Multiple Living   2 2 2  0 0 0 0 0 0 2     Review of Systems  Constitutional: Negative for fever.  HENT: Negative for rhinorrhea and sore throat.    Eyes: Negative for redness.  Respiratory: Negative for cough and shortness of breath.   Cardiovascular: Positive for chest pain (Right ribs).  Gastrointestinal: Negative for nausea, vomiting, abdominal pain and diarrhea.  Genitourinary: Negative for dysuria.  Musculoskeletal: Negative for myalgias.  Skin: Negative for rash.  Neurological: Negative for headaches.      Allergies  Bactrim and Vicodin  Home Medications   Prior to Admission medications   Medication Sig Start Date End Date Taking? Authorizing Provider  albuterol (PROVENTIL HFA;VENTOLIN HFA) 108 (90 BASE) MCG/ACT inhaler Inhale 2 puffs into the lungs every 6 (six) hours as needed for wheezing or shortness of breath.    Historical Provider, MD  azithromycin (ZITHROMAX) 250 MG tablet Take by mouth daily.    Historical Provider, MD  glucose blood test strip 1 each by Other route as needed for other. Use as instructed    Historical Provider, MD  ibuprofen (ADVIL,MOTRIN) 600 MG tablet Take 1 tablet (600 mg total) by mouth every 6 (six) hours. Patient taking differently: Take 600 mg by mouth every 6 (six) hours as needed (pain).  07/17/13   Gustavo Lah, NP  loratadine (CLARITIN) 10 MG tablet Take 10 mg by mouth daily.    Historical Provider, MD  methocarbamol (ROBAXIN) 500 MG tablet Take 2  tablets (1,000 mg total) by mouth 4 (four) times daily. 11/11/15   Carlisle Cater, PA-C  mometasone [facility]) 220 MCG/INH inhaler Inhale 2 puffs into the lungs daily.    Historical Provider, MD  predniSONE (DELTASONE) 5 MG tablet Take 5 mg by mouth daily with breakfast.    Historical Provider, MD  traMADol (ULTRAM) 50 MG tablet Take 1 tablet (50 mg total) by mouth every 6 (six) hours as needed. 11/11/15   Carlisle Cater, PA-C   BP 101/65 mmHg  Pulse 81  Temp(Src) 97.9 F (36.6 C) (Oral)  Resp 20  Ht 5\' 2"  (1.575 m)  Wt 85.276 kg  BMI 34.38 kg/m2  SpO2 98%  LMP 10/30/2015 (Exact Date) Physical Exam  Constitutional: She appears  well-developed and well-nourished.  HENT:  Head: Normocephalic and atraumatic.  Eyes: Conjunctivae are normal. Right eye exhibits no discharge. Left eye exhibits no discharge.  Neck: Normal range of motion. Neck supple.  Cardiovascular: Normal rate, regular rhythm and normal heart sounds.   Pulmonary/Chest: Effort normal and breath sounds normal. No respiratory distress. She has no wheezes. She has no rales. She exhibits tenderness (Right inferior lateral rib pain without step off or deformity, no skin rash).  Abdominal: Soft. There is no tenderness.  Neurological: She is alert.  Skin: Skin is warm and dry.  Psychiatric: She has a normal mood and affect.  Nursing note and vitals reviewed.   ED Course  Procedures (including critical care time)  Imaging Review Dg Ribs Unilateral W/chest Right  11/11/2015  CLINICAL DATA:  Acute onset of right-sided rib pain. Initial encounter. EXAM: RIGHT RIBS AND CHEST - 3+ VIEW COMPARISON:  Chest radiograph performed 08/09/2008 FINDINGS: No displaced rib fractures are identified. The lungs are well-aerated and clear. There is no evidence of focal opacification, pleural effusion or pneumothorax. The cardiomediastinal silhouette is within normal limits. No acute osseous abnormalities are seen. IMPRESSION: No displaced rib fractures identified. No acute cardiopulmonary process seen. Electronically Signed   By: Garald Balding M.D.   On: 11/11/2015 00:35   I have personally reviewed and evaluated these images and lab results as part of my medical decision-making.   Patient seen and examined. Imaging ordered.    Vital signs reviewed and are as follows: BP 101/65 mmHg  Pulse 81  Temp(Src) 97.9 F (36.6 C) (Oral)  Resp 20  Ht 5\' 2"  (1.575 m)  Wt 85.276 kg  BMI 34.38 kg/m2  SpO2 98%  LMP 10/30/2015 (Exact Date)  Patient declines any additional pain medication.  12:55 AM patient informed of x-ray results. Will discharge to home with tramadol, Robaxin,  NSAIDs. Encouraged patient to rest the area and refrain from any strenuous lifting.  Take 10 deep breaths every hour while awake. This helps to expand your lungs and prevent infections like pneumonia.   Return with worsening pain, trouble breathing, severe cough, or other concerns. Patient verbalizes understanding and agrees with plan.     MDM   Final diagnoses:  Rib pain on right side   Patient with rib injury, unclear if this is muscular or cartilaginous injury. Imaging is negative. Patient without pneumothorax or contusion. No definite fractures. Will treat as rib contusion. Patient otherwise appears well.   Carlisle Cater, PA-C 11/11/15 [id number]  Veryl Speak, MD 11/11/15 [phone]

## 2015-11-11 NOTE — Discharge Instructions (Signed)
Please read and follow all provided instructions.  Your diagnoses today include:  1. Rib pain on right side    Tests performed today include:  X-ray - no definitive broken ribs   Vital signs. See below for your results today.   Medications prescribed:   Tramadol - narcotic-like pain medication  DO NOT drive or perform any activities that require you to be awake and alert because this medicine can make you drowsy.    Robaxin (methocarbamol) - muscle relaxer medication  DO NOT drive or perform any activities that require you to be awake and alert because this medicine can make you drowsy.   Take any prescribed medications only as directed.  Home care instructions:  Follow any educational materials contained in this packet.  Take 10 deep breaths every hour while awake. This helps to expand your lungs and prevent infections like pneumonia.   Follow-up instructions: Please follow-up with your primary care provider in the next 3 days for further evaluation of your symptoms.   Return instructions:   Please return to the Emergency Department if you experience worsening symptoms.   Please return if you have any other emergent concerns.  Additional Information:  Your vital signs today were: BP 101/65 mmHg   Pulse 81   Temp(Src) 97.9 F (36.6 C) (Oral)   Resp 20   Ht 5\' 2"  (1.575 m)   Wt 85.276 kg   BMI 34.38 kg/m2   SpO2 98%   LMP 10/30/2015 (Exact Date) If your blood pressure (BP) was elevated above 135/85 this visit, please have this repeated by your doctor within one month. --------------

## 2016-07-07 DIAGNOSIS — F430 Acute stress reaction: Secondary | ICD-10-CM | POA: Diagnosis not present

## 2016-07-07 DIAGNOSIS — J4520 Mild intermittent asthma, uncomplicated: Secondary | ICD-10-CM | POA: Diagnosis not present

## 2016-07-07 DIAGNOSIS — Z6831 Body mass index (BMI) 31.0-31.9, adult: Secondary | ICD-10-CM | POA: Diagnosis not present

## 2016-08-15 DIAGNOSIS — H5213 Myopia, bilateral: Secondary | ICD-10-CM | POA: Diagnosis not present

## 2016-08-28 MED FILL — AZITHROMYCIN 250 MG TABLET: 250 | 5 days supply | Qty: 6 | Fill #0

## 2016-08-31 ENCOUNTER — Emergency Department (HOSPITAL_BASED_OUTPATIENT_CLINIC_OR_DEPARTMENT_OTHER)
Admission: EM | Admit: 2016-08-31 | Discharge: 2016-08-31 | Disposition: A | Discharge status: Home / Self Care | Payer: 59 | Attending: Emergency Medicine | Admitting: Emergency Medicine

## 2016-08-31 ENCOUNTER — Emergency Department (HOSPITAL_BASED_OUTPATIENT_CLINIC_OR_DEPARTMENT_OTHER): Payer: 59

## 2016-08-31 ENCOUNTER — Encounter (HOSPITAL_BASED_OUTPATIENT_CLINIC_OR_DEPARTMENT_OTHER): Payer: Self-pay | Admitting: *Deleted

## 2016-08-31 DIAGNOSIS — R1030 Lower abdominal pain, unspecified: Secondary | ICD-10-CM | POA: Diagnosis present

## 2016-08-31 DIAGNOSIS — J45909 Unspecified asthma, uncomplicated: Secondary | ICD-10-CM | POA: Insufficient documentation

## 2016-08-31 DIAGNOSIS — E119 Type 2 diabetes mellitus without complications: Secondary | ICD-10-CM | POA: Insufficient documentation

## 2016-08-31 DIAGNOSIS — N201 Calculus of ureter: Secondary | ICD-10-CM

## 2016-08-31 DIAGNOSIS — R109 Unspecified abdominal pain: Secondary | ICD-10-CM | POA: Diagnosis not present

## 2016-08-31 LAB — URINALYSIS, MICROSCOPIC (REFLEX)

## 2016-08-31 LAB — CBC WITH DIFFERENTIAL/PLATELET
Basophils Absolute: 0 10*3/uL (ref 0.0–0.1)
Basophils Relative: 0 %
EOS ABS: 0.2 10*3/uL (ref 0.0–0.7)
EOS PCT: 2 %
HCT: 40.8 % (ref 36.0–46.0)
Hemoglobin: 13.5 g/dL (ref 12.0–15.0)
Lymphocytes Relative: 36 %
Lymphs Abs: 2.7 10*3/uL (ref 0.7–4.0)
MCH: 28 pg (ref 26.0–34.0)
MCHC: 33.1 g/dL (ref 30.0–36.0)
MCV: 84.6 fL (ref 78.0–100.0)
MONO ABS: 0.6 10*3/uL (ref 0.1–1.0)
MONOS PCT: 8 %
Neutro Abs: 4.1 10*3/uL (ref 1.7–7.7)
Neutrophils Relative %: 54 %
PLATELETS: 241 10*3/uL (ref 150–400)
RBC: 4.82 MIL/uL (ref 3.87–5.11)
RDW: 13.1 % (ref 11.5–15.5)
WBC: 7.5 10*3/uL (ref 4.0–10.5)

## 2016-08-31 LAB — COMPREHENSIVE METABOLIC PANEL
ALBUMIN: 4 g/dL (ref 3.5–5.0)
ALK PHOS: 80 U/L (ref 38–126)
ALT: 21 U/L (ref 14–54)
ANION GAP: 7 (ref 5–15)
AST: 19 U/L (ref 15–41)
BILIRUBIN TOTAL: 0.3 mg/dL (ref 0.3–1.2)
BUN: 15 mg/dL (ref 6–20)
CALCIUM: 9.2 mg/dL (ref 8.9–10.3)
CO2: 27 mmol/L (ref 22–32)
CREATININE: 0.82 mg/dL (ref 0.44–1.00)
Chloride: 104 mmol/L (ref 101–111)
GFR calc Af Amer: >60 mL/min (ref >60)
GFR calc non Af Amer: >60 mL/min (ref >60)
Glucose, Bld: 116 mg/dL — ABNORMAL HIGH (ref 65–99)
Potassium: 3.7 mmol/L (ref 3.5–5.1)
Sodium: 138 mmol/L (ref 135–145)
TOTAL PROTEIN: 6.9 g/dL (ref 6.5–8.1)

## 2016-08-31 LAB — URINALYSIS, ROUTINE W REFLEX MICROSCOPIC
BILIRUBIN URINE: NEGATIVE
Glucose, UA: NEGATIVE mg/dL
Ketones, ur: NEGATIVE mg/dL
Leukocytes, UA: NEGATIVE
NITRITE: NEGATIVE
PH: 6 (ref 5.0–8.0)
Protein, ur: NEGATIVE mg/dL
SPECIFIC GRAVITY, URINE: 1.026 (ref 1.005–1.030)

## 2016-08-31 LAB — LIPASE, BLOOD: Lipase: 37 U/L (ref 11–51)

## 2016-08-31 LAB — PREGNANCY, URINE: Preg Test, Ur: NEGATIVE

## 2016-08-31 MED ORDER — NAPROXEN 500 MG PO TABS: 500.0000 mg | ORAL_TABLET | Freq: Two times a day (BID) | ORAL | 0 refills | Status: DC

## 2016-08-31 MED ORDER — SODIUM CHLORIDE 0.9 % IV SOLN
INTRAVENOUS | Status: DC
  Administered 2016-08-31: 07:00:00 via INTRAVENOUS

## 2016-08-31 MED ORDER — HYDROMORPHONE HCL 1 MG/ML IJ SOLN
1.0000 mg | Freq: Once | INTRAMUSCULAR | Status: AC
  Administered 2016-08-31: 1 mg via INTRAVENOUS
  Filled 2016-08-31: qty 1

## 2016-08-31 MED ORDER — SODIUM CHLORIDE 0.9 % IV BOLUS (SEPSIS)
1000.0000 mL | Freq: Once | INTRAVENOUS | Status: AC
  Administered 2016-08-31: 1000 mL via INTRAVENOUS

## 2016-08-31 MED ORDER — KETOROLAC TROMETHAMINE 30 MG/ML IJ SOLN
30.0000 mg | Freq: Once | INTRAMUSCULAR | Status: AC
  Administered 2016-08-31: 30 mg via INTRAVENOUS
  Filled 2016-08-31: qty 1

## 2016-08-31 MED ORDER — ONDANSETRON 8 MG PO TBDP: 8.0000 mg | ORAL_TABLET | Freq: Three times a day (TID) | ORAL | 0 refills | Status: DC | PRN

## 2016-08-31 MED ORDER — ONDANSETRON HCL 4 MG/2ML IJ SOLN
4.0000 mg | Freq: Once | INTRAMUSCULAR | Status: AC
  Administered 2016-08-31: 4 mg via INTRAVENOUS
  Filled 2016-08-31: qty 2

## 2016-08-31 MED ORDER — OXYCODONE-ACETAMINOPHEN 5-325 MG PO TABS: 1.0000 | ORAL_TABLET | Freq: Four times a day (QID) | ORAL | 0 refills | Status: DC | PRN

## 2016-08-31 MED ORDER — HYDROMORPHONE HCL 1 MG/ML IJ SOLN
0.5000 mg | INTRAMUSCULAR | Status: AC | PRN
  Administered 2016-08-31 (×3): 0.5 mg via INTRAVENOUS
  Filled 2016-08-31 (×3): qty 1

## 2016-08-31 NOTE — ED Notes (Signed)
MD with pt  

## 2016-08-31 NOTE — ED Notes (Signed)
MD aware that pt has had 1mg  of dilaudid with little relief. Pt unable to sit still due to pain 10/10

## 2016-08-31 NOTE — Discharge Instructions (Signed)
Take the medications as needed for pain, follow up with a urologist for further evaluation

## 2016-08-31 NOTE — ED Triage Notes (Signed)
c/o left flank pain that woke patient from sleep around 400am. c/o nausea. No vomiting. States hx of kidney stone. Pt unable to sit still. MD at bedside on arrival.

## 2016-08-31 NOTE — ED Notes (Addendum)
Pt in CT.

## 2016-08-31 NOTE — ED Provider Notes (Signed)
[place] DEPT [facility] Provider Note   CSN: [id number] Arrival date & time: 08/31/16  0451     History   Chief Complaint Chief Complaint  Patient presents with  . Flank Pain    HPI Abigail Frank is a 31 y.o. female.  The history is provided by the patient.  Flank Pain  This is a new problem. Episode onset: Initially started with lower abdominal pain and urethral pain earlier in the week.  initially mild.  Severe flank pain woke her up this am. The problem occurs constantly. The problem has been rapidly worsening. Pertinent negatives include no abdominal pain. Nothing aggravates the symptoms. Nothing relieves the symptoms.  Pain is in the left flank.  Radiates to the groin.  Nausea without vomiting.  Feels similar to previous kidney stones.  Past Medical History:  Diagnosis Date  . Asthma   . Diabetes in pregnancy   . Gestational diabetes   . Hx of varicella   . IBS (irritable bowel syndrome)   . Kidney stones   . Ovarian cyst   . Postpartum care following vaginal delivery (1/30) 07/15/2013  . Preterm uterine contractions 06/10/2013    Patient Active Problem List   Diagnosis Date Noted  . Gestational diabetes mellitus, class A2 07/15/2013  . Postpartum care following vaginal delivery (1/30) 07/15/2013  . Preterm uterine contractions 06/10/2013    Past Surgical History:  Procedure Laterality Date  . MOUTH SURGERY      OB History    Gravida Para Term Preterm AB Living   2 2 2  0 0 2   SAB TAB Ectopic Multiple Live Births   0 0 0 0 2       Home Medications    Prior to Admission medications   Medication Sig Start Date End Date Taking? Authorizing Provider  SUMAtriptan Succinate (IMITREX PO) Take 50 mg by mouth.   Yes Historical Provider, MD  albuterol (PROVENTIL HFA;VENTOLIN HFA) 108 (90 BASE) MCG/ACT inhaler Inhale 2 puffs into the lungs every 6 (six) hours as needed for wheezing or shortness of breath.    Historical Provider, MD  azithromycin  (ZITHROMAX) 250 MG tablet Take by mouth daily.    Historical Provider, MD  glucose blood test strip 1 each by Other route as needed for other. Use as instructed    Historical Provider, MD  ibuprofen (ADVIL,MOTRIN) 600 MG tablet Take 1 tablet (600 mg total) by mouth every 6 (six) hours. Patient taking differently: Take 600 mg by mouth every 6 (six) hours as needed (pain).  07/17/13   Gustavo Lah, NP  loratadine (CLARITIN) 10 MG tablet Take 10 mg by mouth daily.    Historical Provider, MD  methocarbamol (ROBAXIN) 500 MG tablet Take 2 tablets (1,000 mg total) by mouth 4 (four) times daily. 11/11/15   Carlisle Cater, PA-C  mometasone [facility]) 220 MCG/INH inhaler Inhale 2 puffs into the lungs daily.    Historical Provider, MD  naproxen (NAPROSYN) 500 MG tablet Take 1 tablet (500 mg total) by mouth 2 (two) times daily with a meal. As needed for pain 08/31/16   Dorie Rank, MD  ondansetron (ZOFRAN ODT) 8 MG disintegrating tablet Take 1 tablet (8 mg total) by mouth every 8 (eight) hours as needed for nausea or vomiting. 08/31/16   Dorie Rank, MD  oxyCODONE-acetaminophen (PERCOCET) 5-325 MG tablet Take 1 tablet by mouth every 6 (six) hours as needed. 08/31/16   Dorie Rank, MD  predniSONE (DELTASONE) 5 MG tablet Take 5 mg by mouth  daily with breakfast.    Historical Provider, MD  traMADol (ULTRAM) 50 MG tablet Take 1 tablet (50 mg total) by mouth every 6 (six) hours as needed. 11/11/15   Carlisle Cater, PA-C    Family History Family History  Problem Relation Age of Onset  . Early death Maternal Grandfather 30    ?pulmonary disease  . Hypertension Mother   . Hypothyroidism Mother   . Heart attack Mother   . Diabetes Mother   . Heart disease Mother     heart failure  . Hypertension Father   . Hypothyroidism Father   . Diabetes Father   . Cancer Maternal Grandmother     colon    Social History Social History  Substance Use Topics  . Smoking status: Never Smoker  . Smokeless tobacco: Never Used  .  Alcohol use No     Allergies   Bactrim [sulfamethoxazole-trimethoprim] and Vicodin [hydrocodone-acetaminophen]   Review of Systems Review of Systems  Gastrointestinal: Negative for abdominal pain.  Genitourinary: Positive for flank pain.  All other systems reviewed and are negative.    Physical Exam Updated Vital Signs BP 124/73 (BP Location: Right Arm)   Pulse 77   Temp 98.3 F (36.8 C) (Oral)   Resp 16   Ht 5\' 2"  (1.575 m)   Wt 86.2 kg   LMP 08/17/2016 (Approximate)   SpO2 100%   BMI 34.75 kg/m   Physical Exam  Constitutional: She appears well-developed and well-nourished. No distress.  HENT:  Head: Normocephalic and atraumatic.  Right Ear: External ear normal.  Left Ear: External ear normal.  Eyes: Conjunctivae are normal. Right eye exhibits no discharge. Left eye exhibits no discharge. No scleral icterus.  Neck: Neck supple. No tracheal deviation present.  Cardiovascular: Normal rate, regular rhythm and intact distal pulses.   Pulmonary/Chest: Effort normal and breath sounds normal. No stridor. No respiratory distress. She has no wheezes. She has no rales.  Abdominal: Soft. Bowel sounds are normal. She exhibits no distension. There is no tenderness. There is CVA tenderness (left). There is no rebound and no guarding.  Musculoskeletal: She exhibits no edema or tenderness.  Neurological: She is alert. She has normal strength. No cranial nerve deficit (no facial droop, extraocular movements intact, no slurred speech) or sensory deficit. She exhibits normal muscle tone. She displays no seizure activity. Coordination normal.  Skin: Skin is warm and dry. No rash noted.  Psychiatric: She has a normal mood and affect.  Nursing note and vitals reviewed.    ED Treatments / Results  Labs (all labs ordered are listed, but only abnormal results are displayed) Labs Reviewed  URINALYSIS, ROUTINE W REFLEX MICROSCOPIC - Abnormal; Notable for the following:       Result Value     APPearance CLOUDY (*)    Hgb urine dipstick LARGE (*)    All other components within normal limits  COMPREHENSIVE METABOLIC PANEL - Abnormal; Notable for the following:    Glucose, Bld 116 (*)    All other components within normal limits  URINALYSIS, MICROSCOPIC (REFLEX) - Abnormal; Notable for the following:    Bacteria, UA FEW (*)    Squamous Epithelial / LPF 6-30 (*)    All other components within normal limits  PREGNANCY, URINE  LIPASE, BLOOD  CBC WITH DIFFERENTIAL/PLATELET    Radiology Ct Renal Stone Study  Result Date: 08/31/2016 CLINICAL DATA:  Left flank pain for nausea beginning this morning. EXAM: CT ABDOMEN AND PELVIS WITHOUT CONTRAST TECHNIQUE: Multidetector CT imaging  of the abdomen and pelvis was performed following the standard protocol without IV contrast. COMPARISON:  None. FINDINGS: Lower chest: Tiny calcified granuloma over the left lower lobe. Hepatobiliary: Within normal. Pancreas: Within normal. Spleen: Within normal. Adrenals/Urinary Tract: Kidneys are normal in size as there is mild dilatation of the left intrarenal collecting system. No nephrolithiasis or focal mass. Mild prominence of the left ureter as there is a 3.5 mm stone at the left UVJ causing this low-grade obstruction. Right ureter is normal. Stomach/Bowel: Stomach and small bowel are within normal. Appendix is normal. Colon is within normal. Vascular/Lymphatic: Within normal. Reproductive: Within normal. Other: None. Musculoskeletal: Minimal degenerate change of the lower thoracic spine IMPRESSION: Three-4 mm stone at the left UVJ causing low-grade obstruction. Electronically Signed   By: Marin Olp M.D.   On: 08/31/2016 07:10    Procedures Procedures (including critical care time)  Medications Ordered in ED Medications  sodium chloride 0.9 % bolus 1,000 mL (0 mLs Intravenous Stopped 08/31/16 0636)    And  0.9 %  sodium chloride infusion ( Intravenous Stopped 08/31/16 0726)  HYDROmorphone  (DILAUDID) injection 0.5 mg (0.5 mg Intravenous Given 08/31/16 0549)  ondansetron (ZOFRAN) injection 4 mg (4 mg Intravenous Given 08/31/16 0537)  HYDROmorphone (DILAUDID) injection 1 mg (1 mg Intravenous Given 08/31/16 0617)  ketorolac (TORADOL) 30 MG/ML injection 30 mg (30 mg Intravenous Given 08/31/16 0646)     Initial Impression / Assessment and Plan / ED Course  I have reviewed the triage vital signs and the nursing notes.  Pertinent labs & imaging results that were available during my care of the patient were reviewed by me and considered in my medical decision making (see chart for details).   Sx concerning for renal colic.  Required several doses of pain meds.  Ultimately toradol helped.  CT scan did confirm a ureteral stone.  Will dc home with pain meds.  Outpatient follow up  Final Clinical Impressions(s) / ED Diagnoses   Final diagnoses:  Left ureteral stone    New Prescriptions New Prescriptions   NAPROXEN (NAPROSYN) 500 MG TABLET    Take 1 tablet (500 mg total) by mouth 2 (two) times daily with a meal. As needed for pain   ONDANSETRON (ZOFRAN ODT) 8 MG DISINTEGRATING TABLET    Take 1 tablet (8 mg total) by mouth every 8 (eight) hours as needed for nausea or vomiting.   OXYCODONE-ACETAMINOPHEN (PERCOCET) 5-325 MG TABLET    Take 1 tablet by mouth every 6 (six) hours as needed.     Dorie Rank, MD 08/31/16 [phone]

## 2016-09-02 ENCOUNTER — Encounter (HOSPITAL_COMMUNITY): Payer: Self-pay | Admitting: *Deleted

## 2016-09-02 ENCOUNTER — Emergency Department (HOSPITAL_BASED_OUTPATIENT_CLINIC_OR_DEPARTMENT_OTHER)
Admission: EM | Admit: 2016-09-02 | Discharge: 2016-09-02 | Disposition: A | Discharge status: Home / Self Care | Payer: 59 | Attending: Emergency Medicine | Admitting: Emergency Medicine

## 2016-09-02 ENCOUNTER — Other Ambulatory Visit: Payer: Self-pay | Admitting: Urology

## 2016-09-02 ENCOUNTER — Emergency Department (HOSPITAL_BASED_OUTPATIENT_CLINIC_OR_DEPARTMENT_OTHER): Payer: 59

## 2016-09-02 ENCOUNTER — Encounter (HOSPITAL_BASED_OUTPATIENT_CLINIC_OR_DEPARTMENT_OTHER): Payer: Self-pay | Admitting: *Deleted

## 2016-09-02 DIAGNOSIS — N132 Hydronephrosis with renal and ureteral calculous obstruction: Secondary | ICD-10-CM | POA: Diagnosis not present

## 2016-09-02 DIAGNOSIS — R1030 Lower abdominal pain, unspecified: Secondary | ICD-10-CM | POA: Diagnosis present

## 2016-09-02 DIAGNOSIS — N200 Calculus of kidney: Secondary | ICD-10-CM | POA: Diagnosis not present

## 2016-09-02 DIAGNOSIS — J45909 Unspecified asthma, uncomplicated: Secondary | ICD-10-CM | POA: Diagnosis not present

## 2016-09-02 DIAGNOSIS — N202 Calculus of kidney with calculus of ureter: Secondary | ICD-10-CM | POA: Diagnosis not present

## 2016-09-02 LAB — CBC
HCT: 37.8 % (ref 36.0–46.0)
Hemoglobin: 12.6 g/dL (ref 12.0–15.0)
MCH: 28.3 pg (ref 26.0–34.0)
MCHC: 33.3 g/dL (ref 30.0–36.0)
MCV: 84.9 fL (ref 78.0–100.0)
Platelets: 220 10*3/uL (ref 150–400)
RBC: 4.45 MIL/uL (ref 3.87–5.11)
RDW: 13 % (ref 11.5–15.5)
WBC: 7.5 10*3/uL (ref 4.0–10.5)

## 2016-09-02 LAB — URINALYSIS, ROUTINE W REFLEX MICROSCOPIC
BILIRUBIN URINE: NEGATIVE
GLUCOSE, UA: NEGATIVE mg/dL
KETONES UR: NEGATIVE mg/dL
Nitrite: NEGATIVE
PH: 7 (ref 5.0–8.0)
Protein, ur: NEGATIVE mg/dL
Specific Gravity, Urine: 1.017 (ref 1.005–1.030)

## 2016-09-02 LAB — URINALYSIS, MICROSCOPIC (REFLEX)

## 2016-09-02 LAB — COMPREHENSIVE METABOLIC PANEL
ALBUMIN: 3.7 g/dL (ref 3.5–5.0)
ALK PHOS: 63 U/L (ref 38–126)
ALT: 18 U/L (ref 14–54)
AST: 18 U/L (ref 15–41)
Anion gap: 8 (ref 5–15)
BUN: 10 mg/dL (ref 6–20)
CO2: 26 mmol/L (ref 22–32)
CREATININE: 0.73 mg/dL (ref 0.44–1.00)
Calcium: 8.8 mg/dL — ABNORMAL LOW (ref 8.9–10.3)
Chloride: 104 mmol/L (ref 101–111)
GFR calc non Af Amer: >60 mL/min (ref >60)
GLUCOSE: 108 mg/dL — AB (ref 65–99)
Potassium: 3.8 mmol/L (ref 3.5–5.1)
SODIUM: 138 mmol/L (ref 135–145)
Total Bilirubin: 0.5 mg/dL (ref 0.3–1.2)
Total Protein: 6.5 g/dL (ref 6.5–8.1)

## 2016-09-02 LAB — PREGNANCY, URINE: Preg Test, Ur: NEGATIVE

## 2016-09-02 MED ORDER — FENTANYL CITRATE (PF) 100 MCG/2ML IJ SOLN
100.0000 ug | Freq: Once | INTRAMUSCULAR | Status: AC
  Administered 2016-09-02: 100 ug via INTRAVENOUS
  Filled 2016-09-02: qty 2

## 2016-09-02 MED ORDER — ONDANSETRON HCL 4 MG/2ML IJ SOLN
4.0000 mg | Freq: Once | INTRAMUSCULAR | Status: AC
  Administered 2016-09-02: 4 mg via INTRAVENOUS
  Filled 2016-09-02: qty 2

## 2016-09-02 MED ORDER — OXYCODONE-ACETAMINOPHEN 5-325 MG PO TABS
2.0000 | ORAL_TABLET | Freq: Once | ORAL | Status: AC
  Administered 2016-09-02: 2 via ORAL
  Filled 2016-09-02: qty 2

## 2016-09-02 MED ORDER — KETOROLAC TROMETHAMINE 30 MG/ML IJ SOLN
30.0000 mg | Freq: Once | INTRAMUSCULAR | Status: AC
  Administered 2016-09-02: 30 mg via INTRAVENOUS
  Filled 2016-09-02: qty 1

## 2016-09-02 NOTE — ED Notes (Signed)
Patient transported to Ultrasound 

## 2016-09-02 NOTE — ED Provider Notes (Signed)
[place] DEPT Provider Note   CSN: [id number] Arrival date & time: 09/02/16 3762     History    Chief Complaint  Patient presents with  . Flank Pain     HPI Abigail Frank is a 31 y.o. female.  31yo F w/ PMH including asthma, kidney stones who p/w L flank pain. 5 days ago, the patient began having suprapubic abdominal cramping which felt like menstrual cramps associated with some urethral burning. The symptoms resolved but then 2 days ago she woke up with severe left flank pain associated with nausea and vomiting. She presented to the ED where she was diagnosed with a kidney stone. Her symptoms improved after medications here and she was discharged home. She has a follow-up appointment with urology scheduled this afternoon. She was doing okay yesterday with the left flank pain resolved. She continued to have some lower abdominal cramping and some dysuria. She also noticed some sediment in the urine strainer but no large stones. This morning at 6 AM she woke up again with severe left flank pain. Minimal improvement after 2 Percocets at home. She continues to have nausea but has not yet vomited. She had some diarrhea yesterday, no fevers. No hematuria. She reports that she has recently had an upper respiratory infection for which she just completed azithromycin.   Past Medical History:  Diagnosis Date  . Asthma   . Diabetes in pregnancy   . Gestational diabetes   . Hx of varicella   . IBS (irritable bowel syndrome)   . Kidney stones   . Ovarian cyst   . Postpartum care following vaginal delivery (1/30) 07/15/2013  . Preterm uterine contractions 06/10/2013     Patient Active Problem List   Diagnosis Date Noted  . Gestational diabetes mellitus, class A2 07/15/2013  . Postpartum care following vaginal delivery (1/30) 07/15/2013  . Preterm uterine contractions 06/10/2013    Past Surgical History:  Procedure Laterality Date  . MOUTH SURGERY      OB History    Gravida Para Term Preterm AB Living   2 2 2  0 0 2   SAB TAB Ectopic Multiple Live Births   0 0 0 0 2        Home Medications    Prior to Admission medications   Medication Sig Start Date End Date Taking? Authorizing Provider  albuterol (PROVENTIL HFA;VENTOLIN HFA) 108 (90 BASE) MCG/ACT inhaler Inhale 2 puffs into the lungs every 6 (six) hours as needed for wheezing or shortness of breath.   Yes Historical Provider, MD  glucose blood test strip 1 each by Other route as needed for other. Use as instructed   Yes Historical Provider, MD  ibuprofen (ADVIL,MOTRIN) 600 MG tablet Take 1 tablet (600 mg total) by mouth every 6 (six) hours. Patient taking differently: Take 600 mg by mouth every 6 (six) hours as needed (pain).  07/17/13  Yes Gustavo Lah, NP  loratadine (CLARITIN) 10 MG tablet Take 10 mg by mouth daily.   Yes Historical Provider, MD  methocarbamol (ROBAXIN) 500 MG tablet Take 2 tablets (1,000 mg total) by mouth 4 (four) times daily. 11/11/15  Yes Carlisle Cater, PA-C  mometasone [facility]) 220 MCG/INH inhaler Inhale 2 puffs into the lungs daily.   Yes Historical Provider, MD  naproxen (NAPROSYN) 500 MG tablet Take 1 tablet (500 mg total) by mouth 2 (two) times daily with a meal. As needed for pain 08/31/16  Yes Dorie Rank, MD  ondansetron (ZOFRAN ODT) 8 MG disintegrating tablet  Take 1 tablet (8 mg total) by mouth every 8 (eight) hours as needed for nausea or vomiting. 08/31/16  Yes Dorie Rank, MD  oxyCODONE-acetaminophen (PERCOCET) 5-325 MG tablet Take 1 tablet by mouth every 6 (six) hours as needed. 08/31/16  Yes Dorie Rank, MD  SUMAtriptan Succinate (IMITREX PO) Take 50 mg by mouth.   Yes Historical Provider, MD  azithromycin (ZITHROMAX) 250 MG tablet Take by mouth daily.    Historical Provider, MD  predniSONE (DELTASONE) 5 MG tablet Take 5 mg by mouth daily with breakfast.    Historical Provider, MD  traMADol (ULTRAM) 50 MG tablet Take 1 tablet (50 mg total) by mouth every 6 (six) hours as  needed. 11/11/15   Carlisle Cater, PA-C      Family History  Problem Relation Age of Onset  . Early death Maternal Grandfather 67    ?pulmonary disease  . Hypertension Mother   . Hypothyroidism Mother   . Heart attack Mother   . Diabetes Mother   . Heart disease Mother     heart failure  . Hypertension Father   . Hypothyroidism Father   . Diabetes Father   . Cancer Maternal Grandmother     colon     Social History  Substance Use Topics  . Smoking status: Never Smoker  . Smokeless tobacco: Never Used  . Alcohol use No     Allergies     Bactrim [sulfamethoxazole-trimethoprim] and Vicodin [hydrocodone-acetaminophen]    Review of Systems  10 Systems reviewed and are negative for acute change except as noted in the HPI.   Physical Exam Updated Vital Signs BP 112/67 (BP Location: Left Arm)   Pulse 76   Temp 98.1 F (36.7 C) (Oral)   Resp 16   Ht 5\' 2"  (1.575 m)   Wt 190 lb (86.2 kg)   LMP 08/17/2016 (Approximate)   SpO2 100%   BMI 34.75 kg/m   Physical Exam  Constitutional: She is oriented to person, place, and time. She appears well-developed and well-nourished. She appears distressed.  In mild distress due to pain  HENT:  Head: Normocephalic and atraumatic.  Moist mucous membranes  Eyes: Conjunctivae are normal. Pupils are equal, round, and reactive to light.  Neck: Neck supple.  Cardiovascular: Normal rate, regular rhythm and normal heart sounds.   No murmur heard. Pulmonary/Chest: Effort normal and breath sounds normal.  Abdominal: Soft. Bowel sounds are normal. She exhibits no distension. There is no tenderness.  Genitourinary:  Genitourinary Comments: + L flank tenderness  Musculoskeletal: She exhibits no edema.  Neurological: She is alert and oriented to person, place, and time.  Fluent speech  Skin: Skin is warm and dry.  Psychiatric: She has a normal mood and affect. Judgment normal.  Nursing note and vitals reviewed.     ED Treatments  / Results  Labs (all labs ordered are listed, but only abnormal results are displayed) Labs Reviewed  COMPREHENSIVE METABOLIC PANEL - Abnormal; Notable for the following:       Result Value   Glucose, Bld 108 (*)    Calcium 8.8 (*)    All other components within normal limits  URINALYSIS, ROUTINE W REFLEX MICROSCOPIC - Abnormal; Notable for the following:    APPearance CLOUDY (*)    Hgb urine dipstick MODERATE (*)    Leukocytes, UA SMALL (*)    All other components within normal limits  URINALYSIS, MICROSCOPIC (REFLEX) - Abnormal; Notable for the following:    Bacteria, UA MANY (*)  Squamous Epithelial / LPF 6-30 (*)    All other components within normal limits  URINE CULTURE  PREGNANCY, URINE  CBC     EKG  EKG Interpretation  Date/Time:    Ventricular Rate:    PR Interval:    QRS Duration:   QT Interval:    QTC Calculation:   R Axis:     Text Interpretation:           Radiology US Renal  Result Date: 09/02/2016 CLINICAL DATA:  Severe left flank pain. Recently diagnosed left ureterovesical junction calculus. EXAM: RENAL / URINARY TRACT ULTRASOUND COMPLETE COMPARISON:  Abdomen and pelvis CT dated 08/31/2016. FINDINGS: Right Kidney: Length: 10.7 cm. Echogenicity within normal limits. No mass or hydronephrosis visualized. Left Kidney: Length: 11.9 cm. Normal echogenicity. Stable mildly dilated collecting system. Bladder: The previously demonstrated 4 mm left ureterovesical junction calculus is again demonstrated. Otherwise, normal appearing urinary bladder. IMPRESSION: Stable left ureterovesical junction calculus and mild left hydronephrosis. Electronically Signed   By: Claudie Revering M.D.   On: 09/02/2016 09:19    Procedures Procedures (including critical care time) Procedures  Medications Ordered in ED  Medications  fentaNYL (SUBLIMAZE) injection 100 mcg (100 mcg Intravenous Given 09/02/16 0821)  ondansetron (ZOFRAN) injection 4 mg (4 mg Intravenous Given 09/02/16  0820)  ketorolac (TORADOL) 30 MG/ML injection 30 mg (30 mg Intravenous Given 09/02/16 0911)  oxyCODONE-acetaminophen (PERCOCET/ROXICET) 5-325 MG per tablet 2 tablet (2 tablets Oral Given 09/02/16 1115)     Initial Impression / Assessment and Plan / ED Course  I have reviewed the triage vital signs and the nursing notes.  Pertinent labs & imaging results that were available during my care of the patient were reviewed by me and considered in my medical decision making (see chart for details).    PT w/ h/o kidney stones, diagnosed w/ distal 16mm stone 2 days ago, p/w sudden return of L flank pain. In mild distress due to pain on exam, VS stable, afebrile. No abd tenderness. Gave zofran, fentanyl for sx and obtained labs and [place] to evaluate for hydronephrosis or AKI.   Labwork is reassuring with normal creatinine, normal CBC, UA containing blood but no obvious evidence of infection. Urine culture sent. Renal ultrasound shows previously documented stone at UVJ with mild hydronephrosis. Patient was comfortable on reexamination and stated that her pain had greatly improved. Provided patient w/ dose of percocet prior to discharge. She has an appointment this afternoon with Dr. Tresa Moore, urologist, and given her reassuring workup I feel she is safe for outpatient follow-up to discuss further management of her stone. I have instructed her to return to [facility] if she develops any acute complications prior to appt. Pt voiced understanding and was discharged in satisfactory condition.  Final Clinical Impressions(s) / ED Diagnoses   Final diagnoses:  Kidney stone     Discharge Medication List as of 09/02/2016 11:19 AM         Sharlett Iles, MD 09/02/16 1200

## 2016-09-02 NOTE — ED Triage Notes (Addendum)
Pt seen for kidney stone this past Sunday. Reports pain resolved Sunday afternoon. Reports mild cramping sensation Monday but that severe L flank pain returned this morning around 0600. Reports taking 2 tabs Percocet around 0645 with minimal relief. Denies fever. Reports nausea. Reports vomiting x1 Sunday with diarrhea yesterday. Reports she has an appt with urology this afternoon.

## 2016-09-03 ENCOUNTER — Ambulatory Visit (HOSPITAL_COMMUNITY): Admission: RE | Admit: 2016-09-03 | Payer: 59 | Source: Ambulatory Visit | Admitting: Urology

## 2016-09-03 ENCOUNTER — Encounter (HOSPITAL_COMMUNITY): Admission: RE | Payer: Self-pay | Source: Ambulatory Visit

## 2016-09-03 LAB — URINE CULTURE: Special Requests: NORMAL

## 2016-09-03 SURGERY — CYSTOURETEROSCOPY, WITH RETROGRADE PYELOGRAM AND STENT INSERTION
Anesthesia: General | Laterality: Left

## 2016-09-03 NOTE — Discharge Instructions (Signed)
1 - You may have urinary urgency (bladder spasms) and bloody urine on / off with stent in place. This is normal. ° °2 - Call MD or go to ER for fever >102, severe pain / nausea / vomiting not relieved by medications, or acute change in medical status ° °

## 2016-09-25 DIAGNOSIS — N202 Calculus of kidney with calculus of ureter: Secondary | ICD-10-CM | POA: Diagnosis not present

## 2016-09-29 DIAGNOSIS — Z01419 Encounter for gynecological examination (general) (routine) without abnormal findings: Secondary | ICD-10-CM | POA: Diagnosis not present

## 2016-09-29 DIAGNOSIS — Z6833 Body mass index (BMI) 33.0-33.9, adult: Secondary | ICD-10-CM | POA: Diagnosis not present

## 2016-09-29 DIAGNOSIS — Z1151 Encounter for screening for human papillomavirus (HPV): Secondary | ICD-10-CM | POA: Diagnosis not present

## 2016-09-29 MED FILL — NORETHIN-ESTRAD-FERR 1-0.02: 1-20 | 84 days supply | Qty: 84 | Fill #0 | Status: TO

## 2016-11-27 DIAGNOSIS — N200 Calculus of kidney: Secondary | ICD-10-CM | POA: Diagnosis not present

## 2016-11-28 DIAGNOSIS — N200 Calculus of kidney: Secondary | ICD-10-CM | POA: Diagnosis not present

## 2016-12-08 MED FILL — LARIN FE 1-20 TABLET: 1-20 | 84 days supply | Qty: 84 | Fill #0 | Status: TO

## 2016-12-09 DIAGNOSIS — N202 Calculus of kidney with calculus of ureter: Secondary | ICD-10-CM | POA: Diagnosis not present

## 2016-12-09 DIAGNOSIS — R34 Anuria and oliguria: Secondary | ICD-10-CM | POA: Diagnosis not present

## 2016-12-09 MED FILL — INDAPAMIDE 1.25 MG TABLET: 1.25 | 90 days supply | Qty: 90 | Fill #0

## 2016-12-23 DIAGNOSIS — R7302 Impaired glucose tolerance (oral): Secondary | ICD-10-CM | POA: Diagnosis not present

## 2016-12-23 DIAGNOSIS — Z0000 Encounter for general adult medical examination without abnormal findings: Secondary | ICD-10-CM | POA: Diagnosis not present

## 2016-12-31 DIAGNOSIS — G43909 Migraine, unspecified, not intractable, without status migrainosus: Secondary | ICD-10-CM | POA: Diagnosis not present

## 2016-12-31 DIAGNOSIS — Z0000 Encounter for general adult medical examination without abnormal findings: Secondary | ICD-10-CM | POA: Diagnosis not present

## 2016-12-31 DIAGNOSIS — Z1389 Encounter for screening for other disorder: Secondary | ICD-10-CM | POA: Diagnosis not present

## 2016-12-31 DIAGNOSIS — J302 Other seasonal allergic rhinitis: Secondary | ICD-10-CM | POA: Diagnosis not present

## 2016-12-31 DIAGNOSIS — F430 Acute stress reaction: Secondary | ICD-10-CM | POA: Diagnosis not present

## 2016-12-31 DIAGNOSIS — R7302 Impaired glucose tolerance (oral): Secondary | ICD-10-CM | POA: Diagnosis not present

## 2016-12-31 DIAGNOSIS — J4520 Mild intermittent asthma, uncomplicated: Secondary | ICD-10-CM | POA: Diagnosis not present

## 2016-12-31 DIAGNOSIS — N200 Calculus of kidney: Secondary | ICD-10-CM | POA: Diagnosis not present

## 2016-12-31 DIAGNOSIS — M479 Spondylosis, unspecified: Secondary | ICD-10-CM | POA: Diagnosis not present

## 2017-02-06 MED FILL — BACLOFEN 10 MG TABLET: 10 | 10 days supply | Qty: 10 | Fill #0

## 2017-02-26 DIAGNOSIS — Z6832 Body mass index (BMI) 32.0-32.9, adult: Secondary | ICD-10-CM | POA: Diagnosis not present

## 2017-02-26 DIAGNOSIS — H748X1 Other specified disorders of right middle ear and mastoid: Secondary | ICD-10-CM | POA: Diagnosis not present

## 2017-02-26 MED FILL — FLUTICASONE PROP 50 MCG SPR: 50 | 30 days supply | Qty: 16 | Fill #0

## 2017-03-03 MED FILL — LARIN FE 1-20 TABLET: 1-20 | 84 days supply | Qty: 84 | Fill #0

## 2017-04-20 MED FILL — INDAPAMIDE 1.25 MG TABLET: 1.25 | 90 days supply | Qty: 90 | Fill #1

## 2017-04-20 MED FILL — BACLOFEN 10 MG TABLET: 10 | 10 days supply | Qty: 10 | Fill #0

## 2017-05-29 MED FILL — PROAIR RESPICLICK INHAL PWD: 108 (90 BAS | 17 days supply | Qty: 1 | Fill #0

## 2017-05-29 MED FILL — predniSONE 10 MG TABS: 10 | 6 days supply | Qty: 24 | Fill #0

## 2017-06-10 DIAGNOSIS — Z6833 Body mass index (BMI) 33.0-33.9, adult: Secondary | ICD-10-CM | POA: Diagnosis not present

## 2017-06-10 DIAGNOSIS — J4520 Mild intermittent asthma, uncomplicated: Secondary | ICD-10-CM | POA: Diagnosis not present

## 2017-06-10 MED FILL — FLOVENT HFA 220 MCG INHALER: 220 | 60 days supply | Qty: 12 | Fill #0

## 2017-06-10 MED FILL — MICROCHAMBER: 1 days supply | Qty: 1 | Fill #0

## 2017-06-10 MED FILL — predniSONE 10 MG TABS: 10 | 3 days supply | Qty: 6 | Fill #0

## 2017-06-15 MED FILL — predniSONE 20 MG TABS: 20 | 8 days supply | Qty: 12 | Fill #0

## 2017-06-16 ENCOUNTER — Emergency Department (HOSPITAL_BASED_OUTPATIENT_CLINIC_OR_DEPARTMENT_OTHER): Payer: 59

## 2017-06-16 ENCOUNTER — Other Ambulatory Visit: Payer: Self-pay

## 2017-06-16 ENCOUNTER — Emergency Department (HOSPITAL_BASED_OUTPATIENT_CLINIC_OR_DEPARTMENT_OTHER)
Admission: EM | Admit: 2017-06-16 | Discharge: 2017-06-17 | Disposition: A | Discharge status: Home / Self Care | Payer: 59 | Attending: Emergency Medicine | Admitting: Emergency Medicine

## 2017-06-16 ENCOUNTER — Encounter (HOSPITAL_BASED_OUTPATIENT_CLINIC_OR_DEPARTMENT_OTHER): Payer: Self-pay | Admitting: Emergency Medicine

## 2017-06-16 DIAGNOSIS — J4520 Mild intermittent asthma, uncomplicated: Secondary | ICD-10-CM | POA: Diagnosis not present

## 2017-06-16 DIAGNOSIS — J45909 Unspecified asthma, uncomplicated: Secondary | ICD-10-CM | POA: Diagnosis not present

## 2017-06-16 DIAGNOSIS — Z79899 Other long term (current) drug therapy: Secondary | ICD-10-CM | POA: Insufficient documentation

## 2017-06-16 DIAGNOSIS — R0602 Shortness of breath: Secondary | ICD-10-CM | POA: Diagnosis not present

## 2017-06-16 DIAGNOSIS — R05 Cough: Secondary | ICD-10-CM | POA: Diagnosis not present

## 2017-06-16 MED ORDER — IPRATROPIUM-ALBUTEROL 0.5-2.5 (3) MG/3ML IN SOLN
3.0000 mL | Freq: Four times a day (QID) | RESPIRATORY_TRACT | Status: DC
  Administered 2017-06-16: 3 mL via RESPIRATORY_TRACT
  Filled 2017-06-16: qty 3

## 2017-06-16 MED ORDER — GI COCKTAIL ~~LOC~~
30.0000 mL | Freq: Once | ORAL | Status: AC
  Administered 2017-06-17: 30 mL via ORAL
  Filled 2017-06-16: qty 30

## 2017-06-16 NOTE — ED Notes (Signed)
Attempted IV in left FA and right AC unsuccessful.

## 2017-06-16 NOTE — ED Notes (Signed)
ED Provider at bedside. 

## 2017-06-16 NOTE — ED Triage Notes (Signed)
Patient has had SOB and a cough x all month. The patient has been on several taper doses and inhalers. The patient reports that it is not getting any better.

## 2017-06-17 DIAGNOSIS — R05 Cough: Secondary | ICD-10-CM | POA: Diagnosis not present

## 2017-06-17 DIAGNOSIS — R0602 Shortness of breath: Secondary | ICD-10-CM | POA: Diagnosis not present

## 2017-06-17 LAB — CBC WITH DIFFERENTIAL/PLATELET
BASOS ABS: 0 10*3/uL (ref 0.0–0.1)
Basophils Relative: 0 %
EOS PCT: 0 %
Eosinophils Absolute: 0 10*3/uL (ref 0.0–0.7)
HEMATOCRIT: 41.7 % (ref 36.0–46.0)
HEMOGLOBIN: 14.2 g/dL (ref 12.0–15.0)
LYMPHS PCT: 17 %
Lymphs Abs: 2.8 10*3/uL (ref 0.7–4.0)
MCH: 27.9 pg (ref 26.0–34.0)
MCHC: 34.1 g/dL (ref 30.0–36.0)
MCV: 81.9 fL (ref 78.0–100.0)
MONOS PCT: 6 %
Monocytes Absolute: 1 10*3/uL (ref 0.1–1.0)
NEUTROS PCT: 77 %
Neutro Abs: 12.4 10*3/uL — ABNORMAL HIGH (ref 1.7–7.7)
Platelets: 249 10*3/uL (ref 150–400)
RBC: 5.09 MIL/uL (ref 3.87–5.11)
RDW: 13 % (ref 11.5–15.5)
WBC: 16.2 10*3/uL — ABNORMAL HIGH (ref 4.0–10.5)

## 2017-06-17 LAB — BASIC METABOLIC PANEL
Anion gap: 11 (ref 5–15)
BUN: 11 mg/dL (ref 6–20)
CHLORIDE: 100 mmol/L — AB (ref 101–111)
CO2: 25 mmol/L (ref 22–32)
Calcium: 9.3 mg/dL (ref 8.9–10.3)
Creatinine, Ser: 0.63 mg/dL (ref 0.44–1.00)
GFR calc Af Amer: >60 mL/min (ref >60)
GFR calc non Af Amer: >60 mL/min (ref >60)
Glucose, Bld: 151 mg/dL — ABNORMAL HIGH (ref 65–99)
POTASSIUM: 3.6 mmol/L (ref 3.5–5.1)
SODIUM: 136 mmol/L (ref 135–145)

## 2017-06-17 LAB — TROPONIN I

## 2017-06-17 LAB — PREGNANCY, URINE: Preg Test, Ur: NEGATIVE

## 2017-06-17 MED ORDER — IOPAMIDOL (ISOVUE-370) INJECTION 76%
100.0000 mL | Freq: Once | INTRAVENOUS | Status: AC | PRN
  Administered 2017-06-17: 100 mL via INTRAVENOUS

## 2017-06-17 NOTE — ED Provider Notes (Addendum)
[place] EMERGENCY DEPARTMENT Provider Note   CSN: [id number] Arrival date & time: 06/16/17  2047     History   Chief Complaint Chief Complaint  Patient presents with  . Shortness of Breath    HPI Abigail Frank is a 32 y.o. female.  The history is provided by the patient.  Shortness of Breath  This is a new problem. The problem occurs continuously.The current episode started more than 1 week ago. The problem has been gradually worsening. Associated symptoms include wheezing. Pertinent negatives include no fever, no headaches, no coryza, no rhinorrhea, no sore throat, no swollen glands, no neck pain, no sputum production, no hemoptysis, no PND, no orthopnea, no chest pain, no syncope, no vomiting, no abdominal pain, no rash, no leg pain, no leg swelling and no claudication. It is unknown what precipitated the problem. Risk factors: none. She has tried beta-agonist inhalers and oral steroids for the symptoms. The treatment provided no relief. She has had no prior ED visits. She has had no prior ICU admissions. Associated medical issues include asthma.    Past Medical History:  Diagnosis Date  . Asthma   . Diabetes in pregnancy   . Gestational diabetes   . Hx of varicella   . IBS (irritable bowel syndrome)   . Kidney stones   . Ovarian cyst   . Postpartum care following vaginal delivery (1/30) 07/15/2013  . Preterm uterine contractions 06/10/2013    Patient Active Problem List   Diagnosis Date Noted  . Gestational diabetes mellitus, class A2 07/15/2013  . Postpartum care following vaginal delivery (1/30) 07/15/2013  . Preterm uterine contractions 06/10/2013    Past Surgical History:  Procedure Laterality Date  . MOUTH SURGERY      OB History    Gravida Para Term Preterm AB Living   2 2 2  0 0 2   SAB TAB Ectopic Multiple Live Births   0 0 0 0 2       Home Medications    Prior to Admission medications   Medication Sig Start Date End Date  Taking? Authorizing Provider  fluticasone (FLOVENT HFA) 110 MCG/ACT inhaler Inhale 1 puff into the lungs 2 (two) times daily.   Yes [provider]  indapamide (LOZOL) 1.25 MG tablet Take 1.25 mg by mouth daily.   Yes [provider]  albuterol (PROVENTIL HFA;VENTOLIN HFA) 108 (90 BASE) MCG/ACT inhaler Inhale 2 puffs into the lungs every 6 (six) hours as needed for wheezing or shortness of breath.    [provider]  azithromycin (ZITHROMAX) 250 MG tablet Take by mouth daily.    [provider]  glucose blood test strip 1 each by Other route as needed for other. Use as instructed    [provider]  ibuprofen (ADVIL,MOTRIN) 600 MG tablet Take 1 tablet (600 mg total) by mouth every 6 (six) hours. Patient taking differently: Take 600 mg by mouth every 6 (six) hours as needed (pain).  07/17/13   Gustavo Lah, NP  loratadine (CLARITIN) 10 MG tablet Take 10 mg by mouth daily.    [provider]  methocarbamol (ROBAXIN) 500 MG tablet Take 2 tablets (1,000 mg total) by mouth 4 (four) times daily. 11/11/15   Carlisle Cater, PA-C  mometasone [facility]) 220 MCG/INH inhaler Inhale 2 puffs into the lungs daily.    [provider]  naproxen (NAPROSYN) 500 MG tablet Take 1 tablet (500 mg total) by mouth 2 (two) times daily with a meal. As needed  for pain 08/31/16   Dorie Rank, MD  ondansetron (ZOFRAN ODT) 8 MG disintegrating tablet Take 1 tablet (8 mg total) by mouth every 8 (eight) hours as needed for nausea or vomiting. 08/31/16   Dorie Rank, MD  oxyCODONE-acetaminophen (PERCOCET) 5-325 MG tablet Take 1 tablet by mouth every 6 (six) hours as needed. 08/31/16   Dorie Rank, MD  predniSONE (DELTASONE) 5 MG tablet Take 5 mg by mouth daily with breakfast.    [provider]  SUMAtriptan Succinate (IMITREX PO) Take 50 mg by mouth.    [provider]  traMADol (ULTRAM) 50 MG tablet Take 1 tablet (50 mg total) by mouth every 6 (six) hours as  needed. 11/11/15   Carlisle Cater, PA-C    Family History Family History  Problem Relation Age of Onset  . Early death Maternal Grandfather 42       ?pulmonary disease  . Hypertension Mother   . Hypothyroidism Mother   . Heart attack Mother   . Diabetes Mother   . Heart disease Mother        heart failure  . Hypertension Father   . Hypothyroidism Father   . Diabetes Father   . Cancer Maternal Grandmother        colon    Social History Social History   Tobacco Use  . Smoking status: Never Smoker  . Smokeless tobacco: Never Used  Substance Use Topics  . Alcohol use: No  . Drug use: No     Allergies   Bactrim [sulfamethoxazole-trimethoprim] and Vicodin [hydrocodone-acetaminophen]   Review of Systems Review of Systems  Constitutional: Negative for diaphoresis and fever.  HENT: Negative for rhinorrhea and sore throat.   Respiratory: Positive for shortness of breath and wheezing. Negative for hemoptysis, sputum production and stridor.   Cardiovascular: Negative for chest pain, palpitations, orthopnea, claudication, leg swelling, syncope and PND.  Gastrointestinal: Negative for abdominal pain and vomiting.  Musculoskeletal: Negative for neck pain.  Skin: Negative for rash.  Neurological: Negative for headaches.  All other systems reviewed and are negative.    Physical Exam Updated Vital Signs BP 107/61 (BP Location: Left Arm)   Pulse (!) 111   Temp 99.1 F (37.3 C) (Oral)   Resp 18   Ht 5\' 2"  (1.575 m)   Wt 86.2 kg (190 lb)   LMP 05/16/2017   SpO2 99%   BMI 34.75 kg/m   Physical Exam  Constitutional: She is oriented to person, place, and time. She appears well-developed and well-nourished. No distress.  HENT:  Head: Normocephalic and atraumatic.  Mouth/Throat: Oropharynx is clear and moist. No oropharyngeal exudate.  Eyes: Conjunctivae are normal. Pupils are equal, round, and reactive to light.  Neck: Normal range of motion. Neck supple. No JVD present.    Cardiovascular: Normal rate, regular rhythm, normal heart sounds and intact distal pulses.  Pulmonary/Chest: No stridor. No respiratory distress. She has decreased breath sounds. She has no wheezes. She has no rales.  Abdominal: Soft. Bowel sounds are normal. She exhibits no mass. There is no tenderness. There is no rebound and no guarding.  Musculoskeletal: Normal range of motion. She exhibits no tenderness.  Lymphadenopathy:    She has no cervical adenopathy.  Neurological: She is alert and oriented to person, place, and time. She displays normal reflexes.  Skin: Skin is warm and dry. Capillary refill takes less than 2 seconds.  Psychiatric: She has a normal mood and affect.     ED Treatments / Results  Labs (all  labs ordered are listed, but only abnormal results are displayed)  Results for orders placed or performed during the hospital encounter of 06/16/17  CBC with Differential/Platelet  Result Value Ref Range   WBC 16.2 (H) 4.0 - 10.5 K/uL   RBC 5.09 3.87 - 5.11 MIL/uL   Hemoglobin 14.2 12.0 - 15.0 g/dL   HCT 41.7 36.0 - 46.0 %   MCV 81.9 78.0 - 100.0 fL   MCH 27.9 26.0 - 34.0 pg   MCHC 34.1 30.0 - 36.0 g/dL   RDW 13.0 11.5 - 15.5 %   Platelets 249 150 - 400 K/uL   Neutrophils Relative % 77 %   Lymphocytes Relative 17 %   Monocytes Relative 6 %   Eosinophils Relative 0 %   Basophils Relative 0 %   Neutro Abs 12.4 (H) 1.7 - 7.7 K/uL   Lymphs Abs 2.8 0.7 - 4.0 K/uL   Monocytes Absolute 1.0 0.1 - 1.0 K/uL   Eosinophils Absolute 0.0 0.0 - 0.7 K/uL   Basophils Absolute 0.0 0.0 - 0.1 K/uL   Smear Review MORPHOLOGY UNREMARKABLE   Pregnancy, urine  Result Value Ref Range   Preg Test, Ur NEGATIVE NEGATIVE  Basic metabolic panel  Result Value Ref Range   Sodium 136 135 - 145 mmol/L   Potassium 3.6 3.5 - 5.1 mmol/L   Chloride 100 (L) 101 - 111 mmol/L   CO2 25 22 - 32 mmol/L   Glucose, Bld 151 (H) 65 - 99 mg/dL   BUN 11 6 - 20 mg/dL   Creatinine, Ser 0.63 0.44 - 1.00  mg/dL   Calcium 9.3 8.9 - 10.3 mg/dL   GFR calc non Af Amer >60 >60 mL/min   GFR calc Af Amer >60 >60 mL/min   Anion gap 11 5 - 15  Troponin I  Result Value Ref Range   Troponin I <0.03 <0.03 ng/mL   Dg Chest 2 View  Result Date: 06/16/2017 CLINICAL DATA:  Shortness of breath and cough all month. No response to medication. History of asthma. Nonsmoker. EXAM: CHEST  2 VIEW COMPARISON:  11/10/2015 FINDINGS: The heart size and mediastinal contours are within normal limits. Both lungs are clear. The visualized skeletal structures are unremarkable. IMPRESSION: No active cardiopulmonary disease. Electronically Signed   By: Lucienne Capers M.D.   On: 06/16/2017 21:25   Ct Angio Chest Pe W And/or Wo Contrast  Result Date: 06/17/2017 CLINICAL DATA:  Acute onset of shortness of breath and cough. EXAM: CT ANGIOGRAPHY CHEST WITH CONTRAST TECHNIQUE: Multidetector CT imaging of the chest was performed using the standard protocol during bolus administration of intravenous contrast. Multiplanar CT image reconstructions and MIPs were obtained to evaluate the vascular anatomy. CONTRAST:  157mL ISOVUE-370 IOPAMIDOL (ISOVUE-370) INJECTION 76% COMPARISON:  Chest radiograph performed 06/16/2016 FINDINGS: Cardiovascular: There is no evidence of significant pulmonary embolus. Evaluation for pulmonary embolus is mildly suboptimal due to limitations in the timing of the contrast bolus. The heart is normal in size. The thoracic aorta is unremarkable. The great vessels are within normal limits. Mediastinum/Nodes: The mediastinum is unremarkable in appearance. No mediastinal lymphadenopathy is seen. No pericardial effusion is identified. The visualized portions of the thyroid gland are unremarkable. No axillary lymphadenopathy is appreciated. Lungs/Pleura: The lungs are clear bilaterally. No focal consolidation, pleural effusion or pneumothorax is seen. No masses are identified. Upper Abdomen: The visualized portions of the liver  and spleen are unremarkable. The visualized portions of the pancreas, adrenal glands and kidneys are within normal limits. The  gallbladder is unremarkable in appearance. Musculoskeletal: No acute osseous abnormalities are identified. The visualized musculature is unremarkable in appearance. Review of the MIP images confirms the above findings. IMPRESSION: 1. No evidence of significant pulmonary embolus. 2. Lungs clear bilaterally. Electronically Signed   By: Garald Balding M.D.   On: 06/17/2017 00:59    EKG  EKG Interpretation  Date/Time:  Wednesday June 17 2017 01:20:01 EST Ventricular Rate:  79 PR Interval:    QRS Duration: 96 QT Interval:  373 QTC Calculation: 428 R Axis:   37 Text Interpretation:  Sinus rhythm Confirmed by Dory Horn) on 06/17/2017 1:22:28 AM      Radiology Dg Chest 2 View  Result Date: 06/16/2017 CLINICAL DATA:  Shortness of breath and cough all month. No response to medication. History of asthma. Nonsmoker. EXAM: CHEST  2 VIEW COMPARISON:  11/10/2015 FINDINGS: The heart size and mediastinal contours are within normal limits. Both lungs are clear. The visualized skeletal structures are unremarkable. IMPRESSION: No active cardiopulmonary disease. Electronically Signed   By: Lucienne Capers M.D.   On: 06/16/2017 21:25   Ct Angio Chest Pe W And/or Wo Contrast  Result Date: 06/17/2017 CLINICAL DATA:  Acute onset of shortness of breath and cough. EXAM: CT ANGIOGRAPHY CHEST WITH CONTRAST TECHNIQUE: Multidetector CT imaging of the chest was performed using the standard protocol during bolus administration of intravenous contrast. Multiplanar CT image reconstructions and MIPs were obtained to evaluate the vascular anatomy. CONTRAST:  161mL ISOVUE-370 IOPAMIDOL (ISOVUE-370) INJECTION 76% COMPARISON:  Chest radiograph performed 06/16/2016 FINDINGS: Cardiovascular: There is no evidence of significant pulmonary embolus. Evaluation for pulmonary embolus is mildly  suboptimal due to limitations in the timing of the contrast bolus. The heart is normal in size. The thoracic aorta is unremarkable. The great vessels are within normal limits. Mediastinum/Nodes: The mediastinum is unremarkable in appearance. No mediastinal lymphadenopathy is seen. No pericardial effusion is identified. The visualized portions of the thyroid gland are unremarkable. No axillary lymphadenopathy is appreciated. Lungs/Pleura: The lungs are clear bilaterally. No focal consolidation, pleural effusion or pneumothorax is seen. No masses are identified. Upper Abdomen: The visualized portions of the liver and spleen are unremarkable. The visualized portions of the pancreas, adrenal glands and kidneys are within normal limits. The gallbladder is unremarkable in appearance. Musculoskeletal: No acute osseous abnormalities are identified. The visualized musculature is unremarkable in appearance. Review of the MIP images confirms the above findings. IMPRESSION: 1. No evidence of significant pulmonary embolus. 2. Lungs clear bilaterally. Electronically Signed   By: Garald Balding M.D.   On: 06/17/2017 00:59    Procedures Procedures (including critical care time)  Medications Ordered in ED Medications  ipratropium-albuterol (DUONEB) 0.5-2.5 (3) MG/3ML nebulizer solution 3 mL (3 mLs Nebulization Given 06/16/17 2328)  gi cocktail (Maalox,Lidocaine,Donnatal) (30 mLs Oral Given 06/17/17 0001)  iopamidol (ISOVUE-370) 76 % injection 100 mL (100 mLs Intravenous Contrast Given 06/17/17 0046)       Final Clinical Impressions(s) / ED Diagnoses  I suspect laryngeal reflux superimposed upon asthma.  Ruled out for MI and PE in the ED.  HEART score is 0, very low risk for MACE.  Will refer to pulmonary and back to PMD for further work up.  Exam is benign and reassuring.    Return for worsening pain, weakness numbness, inability to walk, inability to pass urine, leakage of stool,  fevers > 100.4 unrelieved by  medication,  intractable vomiting, or diarrhea, abdominal pain, Inability to tolerate liquids or food, cough, altered mental  status or any concerns. No signs of systemic illness or infection. The patient is nontoxic-appearing on exam and vital signs are within normal limits.    I have reviewed the triage vital signs and the nursing notes. Pertinent labs &imaging results that were available during my care of the patient were reviewed by me and considered in my medical decision making (see chart for details).  After history, exam, and medical workup I feel the patient has been appropriately medically screened and is safe for discharge home. Pertinent diagnoses were discussed with the patient. Patient was given return precautions    Kennya Schwenn, MD 06/17/17 2595    Randal Buba, Antawn Sison, MD 06/17/17 6387

## 2017-06-22 MED FILL — LARIN FE 1-20 TABLET: 1-20 | 84 days supply | Qty: 84 | Fill #1

## 2017-06-30 MED FILL — BACLOFEN 10 MG TABS: 10 | 10 days supply | Qty: 10 | Fill #0

## 2017-07-14 ENCOUNTER — Ambulatory Visit (INDEPENDENT_AMBULATORY_CARE_PROVIDER_SITE_OTHER): Payer: 59 | Admitting: Internal Medicine

## 2017-07-14 ENCOUNTER — Encounter: Payer: Self-pay | Admitting: Internal Medicine

## 2017-07-14 ENCOUNTER — Other Ambulatory Visit (INDEPENDENT_AMBULATORY_CARE_PROVIDER_SITE_OTHER): Payer: 59

## 2017-07-14 VITALS — BP 132/82 | HR 109 | Ht 62.0 in | Wt 194.6 lb

## 2017-07-14 DIAGNOSIS — J45991 Cough variant asthma: Secondary | ICD-10-CM

## 2017-07-14 LAB — CBC WITH DIFFERENTIAL/PLATELET
Basophils Absolute: 0 10*3/uL (ref 0.0–0.1)
Basophils Relative: 0.6 % (ref 0.0–3.0)
EOS PCT: 1 % (ref 0.0–5.0)
Eosinophils Absolute: 0.1 10*3/uL (ref 0.0–0.7)
HCT: 42.5 % (ref 36.0–46.0)
HEMOGLOBIN: 14.3 g/dL (ref 12.0–15.0)
Lymphocytes Relative: 28.6 % (ref 12.0–46.0)
Lymphs Abs: 2.4 10*3/uL (ref 0.7–4.0)
MCHC: 33.6 g/dL (ref 30.0–36.0)
MCV: 83.1 fl (ref 78.0–100.0)
MONOS PCT: 5.5 % (ref 3.0–12.0)
Monocytes Absolute: 0.5 10*3/uL (ref 0.1–1.0)
Neutro Abs: 5.5 10*3/uL (ref 1.4–7.7)
Neutrophils Relative %: 64.3 % (ref 43.0–77.0)
Platelets: 264 10*3/uL (ref 150.0–400.0)
RBC: 5.12 Mil/uL — ABNORMAL HIGH (ref 3.87–5.11)
RDW: 13.1 % (ref 11.5–15.5)
WBC: 8.5 10*3/uL (ref 4.0–10.5)

## 2017-07-14 LAB — NITRIC OXIDE: NITRIC OXIDE: 10

## 2017-07-14 MED ORDER — FAMOTIDINE 20 MG PO TABS: ORAL_TABLET | ORAL | 2 refills | Status: DC

## 2017-07-14 MED ORDER — PANTOPRAZOLE SODIUM 40 MG PO TBEC: 40.0000 mg | DELAYED_RELEASE_TABLET | Freq: Every day | ORAL | 2 refills | Status: DC

## 2017-07-14 MED FILL — FAMOTIDINE 20 MG TABLET: 20 | 30 days supply | Qty: 30 | Fill #0

## 2017-07-14 MED FILL — PANTOPRAZOLE SOD DR 40 MG T: 40 | 90 days supply | Qty: 90 | Fill #0

## 2017-07-14 NOTE — Progress Notes (Signed)
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 with allergy testing around the age of 70 cat/grass though poor correlation just on prns but no need for any rx by age 32 with onset of coughing at age 55 which was w/in a year or so after 2nd child typically with cold/ sinus flares but gone w/in a week or two just used just short course saba/ asmanex now more than a week or two but as of early Dec 2018 started back up with cough / chest tightness > prednisone rx 75% better but still needed albuterol daily and flared immediately off so started flovent hfa still 75% only so  referred to pulmonary clinic 07/14/2017 by Dr   Osborne Casco    07/14/2017 [facility] office visit/ Abigail Frank   Chief Complaint  Patient presents with  . Pulmonary Consult    Referred by Dr. Osborne Casco. Pt states that she has had Asthma since a child. She has had increased problems with her asthma over the past 2 years. She was recently started on flovent and pepcid and this has helped. She rarely uses her albuterol inhaler.   last saba was 2 weeks prior to OV  > 100% better  Sleeping ok now/ / nose/sinuses feel ok  Overt reflux if bend over  On bcps since 1 y p last child which correlates with onset of recurrent cough /sob  Doe now = MMRC1 = can walk nl pace, flat grade, can't hurry or go uphills or steps s sob  Sensation of globus/ pnds while awake    No obvious  Patterns in day to day or daytime variability or assoc excess/ purulent sputum or mucus plugs or hemoptysis or cp or chest tightness, subjective wheeze or overt sinus or hb symptoms. No unusual exposure hx or h/o childhood pna/  or knowledge of premature birth.  Sleeping ok flat without nocturnal  or early am exacerbation  of respiratory  c/o's or need for noct saba. Also denies any obvious fluctuation of symptoms with weather or environmental changes or  other aggravating or alleviating factors except as outlined above   Current Allergies, Complete Past Medical History, Past Surgical History, Family History, and Social History were reviewed in [employer] record.  ROS  The following are not active complaints unless bolded Hoarseness, sore throat, dysphagia, dental problems, itching, sneezing,  nasal congestion or discharge of excess mucus or purulent secretions, ear ache,   fever, chills, sweats, unintended wt loss or wt gain, classically pleuritic or exertional cp,  orthopnea pnd or leg swelling, presyncope, palpitations, abdominal pain, anorexia, nausea, vomiting, diarrhea  or change in bowel habits or change in bladder habits, change in stools or change in urine, dysuria, hematuria,  rash, arthralgias, visual complaints, headache, numbness, weakness or ataxia or problems with walking or coordination,  change in mood/affect or memory.        Current Meds  Medication Sig  . albuterol (PROVENTIL HFA;VENTOLIN HFA) 108 (90 BASE) MCG/ACT inhaler Inhale 2 puffs into the lungs every 6 (six) hours as needed for wheezing or shortness of breath.  . baclofen (LIORESAL) 10 MG tablet Take 1 tablet by mouth as needed.  . fluticasone (FLOVENT HFA) 110 MCG/ACT inhaler Inhale 1 puff into the lungs 2 (two) times daily.  . indapamide (LOZOL) 1.25 MG tablet Take 1.25 mg by  mouth daily.  . SUMAtriptan Succinate (IMITREX PO) Take 50 mg by mouth.  .  famotidine (PEPCID) 20 MG tablet Take 20 mg by mouth daily.           Review of Systems     Objective:   Physical Exam  amb wf freq throat clearing  Wt Readings from Last 3 Encounters:  07/14/17 194 lb 9.6 oz (88.3 kg)  06/16/17 190 lb (86.2 kg)  09/02/16 190 lb (86.2 kg)     Vital signs reviewed - Note on arrival 02 sats  95% on RA      HEENT: nl dentition, turbinates bilaterally - classic cobblestoning of oropharynx      NECK :  without JVD/Nodes/TM/ nl carotid upstrokes  bilaterally   LUNGS: no acc muscle use,  Nl contour chest which is clear to A and P bilaterally without cough on insp or exp maneuvers   CV:  RRR  no s3 or murmur or increase in P2, and no edema   ABD:  Mod obese/ soft and nontender with nl inspiratory excursion in the supine position. No bruits or organomegaly appreciated, bowel sounds nl  MS:  Nl gait/ ext warm without deformities, calf tenderness, cyanosis or clubbing No obvious joint restrictions   SKIN: warm and dry without lesions    NEURO:  alert, approp, nl sensorium with  no motor or cerebellar deficits apparent.       I personally reviewed images and agree with radiology impression as follows:   Chest CTa  06/16/17 1. No evidence of significant pulmonary embolus. 2. Lungs clear bilaterally.     Labs ordered 07/14/2017    Allergy profile  Assessment:

## 2017-07-14 NOTE — Assessment & Plan Note (Addendum)
FENO 07/14/2017  =   10 on flovent 110 2bid - Spirometry 07/14/2017  wnl including mid flows/ curvature on flovent but no saba x  2 weeks prior Allergy profile 07/14/2017 >  Eos 0. /  IgE  Pending    Cough that flared p 2nd IUP while of bcps is either cough variant asthma or more likely Upper airway cough syndrome (previously labeled PNDS),  is so named because it's frequently impossible to sort out how much is  CR/sinusitis with freq throat clearing (which can be related to primary GERD)   vs  causing  secondary (" extra esophageal")  GERD from wide swings in gastric pressure that occur with throat clearing, often  promoting self use of mint and menthol lozenges that reduce the lower esophageal sphincter tone and exacerbate the problem further in a cyclical fashion.   These are the same pts (now being labeled as having "irritable larynx syndrome" by some cough centers) who not infrequently have a history of having failed to tolerate ace inhibitors,  dry powder inhalers or biphosphonates or report having atypical/extraesophageal reflux symptoms that don't respond to standard doses of PPI  and are easily confused as having aecopd or asthma flares by even experienced allergists/ pulmonologists (myself included).  Of the three most common causes of  Sub-acute or recurrent or chronic cough, only one (GERD)  can actually contribute to/ trigger  the other two (asthma and post nasal drip syndrome)  and perpetuate the cylce of cough.  While not intuitively obvious, many patients with chronic low grade reflux do not cough until there is a primary insult that disturbs the protective epithelial barrier and exposes sensitive nerve endings.   This is typically viral but can be direct physical injury such as with an endotracheal tube.   The point is that once this occurs, it is difficult to eliminate the cycle  using anything but a maximally effective acid suppression regimen at least in the short run, accompanied by an  appropriate diet to address non acid GERD and control   rec max rx for gerd and no change flovent pending f/u in 4 weeks   - The proper method of use, as well as anticipated side effects, of a metered-dose inhaler are discussed and demonstrated to the patient.  Rule of 2's reviewed  Total time devoted to counseling  > 50 % of initial 60 min office visit:  review case with pt/ discussion of options/alternatives/ personally creating written customized instructions  in presence of pt  then going over those specific  Instructions directly with the pt including how to use all of the meds but in particular covering each new medication in detail and the difference between the maintenance= "automatic" meds and the prns using an action plan format for the latter (If this problem/symptom => do that organization reading Left to right).  Please see AVS from this visit for a full list of these instructions which I personally wrote for this pt and  are unique to this visit.

## 2017-07-14 NOTE — Patient Instructions (Addendum)
Pantoprazole (protonix) 40 mg   Take  30-60 min before first meal of the day and Pepcid (famotidine)  20 mg one @  bedtime until return to office - this is the best way to tell whether stomach acid is contributing to your problem.    GERD (REFLUX)  is an extremely common cause of respiratory symptoms just like yours , many times with no obvious heartburn at all.    It can be treated with medication, but also with lifestyle changes including elevation of the head of your bed (ideally with 6 inch  bed blocks),  Smoking cessation, avoidance of late meals, excessive alcohol, and avoid fatty foods, chocolate, peppermint, colas, red wine, and acidic juices such as orange juice.  NO MINT OR MENTHOL PRODUCTS SO NO COUGH DROPS   USE SUGARLESS CANDY INSTEAD (Jolley ranchers or Stover's or Life Savers) or even ice chips will also do - the key is to swallow to prevent all throat clearing. NO OIL BASED VITAMINS - use powdered substitutes.  Please remember to go to the lab department downstairs in the basement  for your tests - we will call you with the results when they are available.      Please schedule a follow up office visit in 4 weeks, sooner if needed

## 2017-07-15 LAB — RESPIRATORY ALLERGY PROFILE REGION II ~~LOC~~
Allergen, Cedar tree, t12: <0.1 kU/L
Allergen, Cottonwood, t14: <0.1 kU/L
Allergen, Mulberry, t76: <0.1 kU/L
Allergen, P. notatum, m1: <0.1 kU/L
Aspergillus fumigatus, m3: <0.1 kU/L
CLADOSPORIUM HERBARUM (M2) IGE: <0.1 kU/L
CLASS: 0
CLASS: 0
CLASS: 0
CLASS: 0
CLASS: 0
CLASS: 0
CLASS: 0
CLASS: 0
CLASS: 0
CLASS: 0
CLASS: 0
CLASS: 0
COMMON RAGWEED (SHORT) (W1) IGE: <0.1 kU/L
Cat Dander: <0.1 kU/L
Class: 0
Class: 0
Class: 0
Class: 0
Class: 0
Class: 0
Class: 0
Class: 0
Class: 0
Class: 0
Class: 0
Class: 0
Cockroach: <0.1 kU/L
D. farinae: <0.1 kU/L
Dog Dander: <0.1 kU/L
IGE (IMMUNOGLOBULIN E), SERUM: 21 kU/L (ref <=114)
Pecan/Hickory Tree IgE: <0.1 kU/L
Rough Pigweed  IgE: <0.1 kU/L

## 2017-07-15 LAB — INTERPRETATION:

## 2017-07-16 ENCOUNTER — Telehealth: Payer: Self-pay | Admitting: Internal Medicine

## 2017-07-16 NOTE — Telephone Encounter (Signed)
Call patient : Studies are unremarkable, no change in recs   Called and spoke with pt and she is aware of results per MW.

## 2017-07-16 NOTE — Progress Notes (Signed)
LMTCB

## 2017-08-11 ENCOUNTER — Ambulatory Visit: Payer: 59 | Admitting: Internal Medicine

## 2017-08-11 ENCOUNTER — Encounter: Payer: Self-pay | Admitting: Internal Medicine

## 2017-08-11 VITALS — BP 122/84 | HR 99 | Ht 62.0 in | Wt 196.6 lb

## 2017-08-11 DIAGNOSIS — J45991 Cough variant asthma: Secondary | ICD-10-CM

## 2017-08-11 NOTE — Patient Instructions (Signed)
Work on your wt and consider alternatives to BCP that do not contain as much progesterone  In 2 months ok to stop pantoprazole and change pepcid to where you take after bfast and supper x one week then just after supper x one week and stop - if symptoms flare, see Dr Osborne Casco for GI referral    In the event of future cough/ flare of asthma > Try prilosec otc 20mg   Take 30-60 min before first meal of the day and Pepcid ac (famotidine) 20 mg one @  bedtime until cough is completely gone for at least a week without the need for cough suppression    If you are satisfied with your treatment plan,  let your doctor know and he/she can either refill your medications or you can return here when your prescription runs out.     If in any way you are not 100% satisfied,  please tell us.  If 100% better, tell your friends!  Pulmonary follow up is as needed

## 2017-08-11 NOTE — Progress Notes (Signed)
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 with allergy testing around the age of 18 cat/grass though poor correlation just on prns but no need for any rx by age 32 with onset of coughing at age 12 which was w/in a year or so after 2nd child typically with cold/ sinus flares but gone w/in a week or two just used just short course saba/ asmanex not more than a week or two but as of early Dec 2018 started back up with cough / chest tightness > prednisone rx 75% better but still needed albuterol daily and flared immediately off so started flovent hfa still 75% only so  referred to pulmonary clinic 07/14/2017 by Dr   Osborne Casco      History of Present Illness  07/14/2017 [facility] office visit/ Kamali Nephew   Chief Complaint  Patient presents with  . Pulmonary Consult    Referred by Dr. Osborne Casco. Pt states that she has had Asthma since a child. She has had increased problems with her asthma over the past 2 years. She was recently started on flovent and pepcid and this has helped. She rarely uses her albuterol inhaler.   last saba was 2 weeks prior to OV  > 100% better  Sleeping ok now/ / nose/sinuses feel ok  Overt reflux if bend over  On bcps since 1 y p last child which correlates with onset of recurrent cough /sob  Doe now = MMRC1 = can walk nl pace, flat grade, can't hurry or go uphills or steps s sob  Sensation of globus/ pnds while awake   rec Pantoprazole (protonix) 40 mg   Take  30-60 min before first meal of the day and Pepcid (famotidine)  20 mg one @  bedtime until return to office - this is the best way to tell whether stomach acid is contributing to your problem.   GERD Please schedule a follow up office visit in 4 weeks, sooner if needed     08/11/2017  f/u ov/Jalesha Plotz re: cough variant asthma vs uacs  Chief Complaint  Patient presents with   . Follow-up    Breathing is bvack to her normal baseline. She has only used her rescue inhaler x 1 since the last visit. No new co's.  Dyspnea:  Chasing p kids/ up and down steps ok  Cough: no cough Sleep: fine SABA use:   Rare   No obvious day to day or daytime variability or assoc excess/ purulent sputum or mucus plugs or hemoptysis or cp or chest tightness, subjective wheeze or overt sinus or hb symptoms. No unusual exposure hx or h/o childhood pna/ asthma or knowledge of premature birth.  Sleeping ok flat without nocturnal  or early am exacerbation  of respiratory  c/o's or need for noct saba. Also denies any obvious fluctuation of symptoms with weather or environmental changes or other aggravating or alleviating factors except as outlined above   Current Allergies, Complete Past Medical History, Past Surgical History, Family History, and Social History were reviewed in [employer] record.  ROS  The following are not active complaints unless bolded Hoarseness, sore throat, dysphagia, dental problems, itching, sneezing,  nasal congestion or discharge of excess mucus or purulent secretions, ear ache,   fever, chills, sweats, unintended wt loss or wt gain,  classically pleuritic or exertional cp,  orthopnea pnd or leg swelling, presyncope, palpitations, abdominal pain, anorexia, nausea, vomiting, diarrhea  or change in bowel habits or change in bladder habits, change in stools or change in urine, dysuria, hematuria,  rash, arthralgias, visual complaints, headache, numbness, weakness or ataxia or problems with walking or coordination,  change in mood/affect or memory.        Current Meds  Medication Sig  . albuterol (PROVENTIL HFA;VENTOLIN HFA) 108 (90 BASE) MCG/ACT inhaler Inhale 2 puffs into the lungs every 6 (six) hours as needed for wheezing or shortness of breath.  . baclofen (LIORESAL) 10 MG tablet Take 5 tablets by mouth as needed.   . famotidine (PEPCID) 20 MG  tablet One at bedtime  . fluticasone (FLOVENT HFA) 110 MCG/ACT inhaler Inhale 1 puff into the lungs 2 (two) times daily.  . indapamide (LOZOL) 1.25 MG tablet Take 1.25 mg by mouth daily.  . pantoprazole (PROTONIX) 40 MG tablet Take 1 tablet (40 mg total) by mouth daily. Take 30-60 min before first meal of the day  . SUMAtriptan Succinate (IMITREX PO) Take 50 mg by mouth.                    Objective:   Physical Exam  amb wf no longer throat clearing / all smiles   08/11/2017        196   07/14/17 194 lb 9.6 oz (88.3 kg)  06/16/17 190 lb (86.2 kg)  09/02/16 190 lb (86.2 kg)    Vital signs reviewed - Note on arrival 02 sats  100% on RA      HEENT: nl dentition, turbinates bilaterally, and oropharynx with mild cobblestoning. Nl external ear canals without cough reflex   NECK :  without JVD/Nodes/TM/ nl carotid upstrokes bilaterally   LUNGS: no acc muscle use,  Nl contour chest which is clear to A and P bilaterally without cough on insp or exp maneuvers   CV:  RRR  no s3 or murmur or increase in P2, and no edema   ABD:  soft and nontender with nl inspiratory excursion in the supine position. No bruits or organomegaly appreciated, bowel sounds nl  MS:  Nl gait/ ext warm without deformities, calf tenderness, cyanosis or clubbing No obvious joint restrictions   SKIN: warm and dry without lesions    NEURO:  alert, approp, nl sensorium with  no motor or cerebellar deficits apparent.             Assessment:

## 2017-08-12 ENCOUNTER — Encounter: Payer: Self-pay | Admitting: Internal Medicine

## 2017-08-12 NOTE — Assessment & Plan Note (Signed)
FENO 07/14/2017  =   10 on flovent 110 one bid  - Spirometry 07/14/2017  wnl including mid flows/ curvature on flovent but no saba x  2 weeks prior Allergy profile 07/14/2017 >  Eos 0.1 /  IgE  21  RAST neg  - Rx for gerd 07/14/17 > symptoms resolved 08/11/2017   All goals of chronic asthma control met including optimal function and elimination of symptoms with minimal need for rescue therapy.  Contingencies discussed in full including contacting this office immediately if not controlling the symptoms using the rule of two's.     It remains to be seen to what extent her asthma control remains dep on gerd rx but if so consider:  1) progesterone relaxes LES so needs to consider alternative   2) slow taper off gerd rx x 2 months from now and refer to GI might be approp, in meantime work on diet  Pulmonary f/u is prn    Each maintenance medication was reviewed in detail including most importantly the difference between maintenance and as needed and under what circumstances the prns are to be used.  Please see AVS for specific  Instructions which are unique to this visit and I personally typed out  which were reviewed in detail in writing with the patient and a copy provided.

## 2017-08-21 MED FILL — BACLOFEN 10 MG TABS: 10 | 10 days supply | Qty: 10 | Fill #1 | Status: TO

## 2017-09-18 MED FILL — INDAPAMIDE 1.25 MG TABLET: 1.25 | 90 days supply | Qty: 90 | Fill #2

## 2017-10-22 MED FILL — BACLOFEN 10 MG TABLET: 10 | 10 days supply | Qty: 10 | Fill #0

## 2017-11-02 DIAGNOSIS — J069 Acute upper respiratory infection, unspecified: Secondary | ICD-10-CM | POA: Diagnosis not present

## 2017-11-02 DIAGNOSIS — J029 Acute pharyngitis, unspecified: Secondary | ICD-10-CM | POA: Diagnosis not present

## 2017-11-17 DIAGNOSIS — J340 Abscess, furuncle and carbuncle of nose: Secondary | ICD-10-CM | POA: Diagnosis not present

## 2017-11-17 DIAGNOSIS — Z6834 Body mass index (BMI) 34.0-34.9, adult: Secondary | ICD-10-CM | POA: Diagnosis not present

## 2017-11-17 MED FILL — DOXYCYCLINE HYCLATE 100 MG: 100 | 7 days supply | Qty: 14 | Fill #0

## 2017-12-16 DIAGNOSIS — D485 Neoplasm of uncertain behavior of skin: Secondary | ICD-10-CM | POA: Diagnosis not present

## 2017-12-16 DIAGNOSIS — D2372 Other benign neoplasm of skin of left lower limb, including hip: Secondary | ICD-10-CM | POA: Diagnosis not present

## 2017-12-16 DIAGNOSIS — D2272 Melanocytic nevi of left lower limb, including hip: Secondary | ICD-10-CM | POA: Diagnosis not present

## 2017-12-16 DIAGNOSIS — D2261 Melanocytic nevi of right upper limb, including shoulder: Secondary | ICD-10-CM | POA: Diagnosis not present

## 2017-12-16 DIAGNOSIS — D2371 Other benign neoplasm of skin of right lower limb, including hip: Secondary | ICD-10-CM | POA: Diagnosis not present

## 2017-12-16 DIAGNOSIS — D2262 Melanocytic nevi of left upper limb, including shoulder: Secondary | ICD-10-CM | POA: Diagnosis not present

## 2017-12-16 DIAGNOSIS — D2271 Melanocytic nevi of right lower limb, including hip: Secondary | ICD-10-CM | POA: Diagnosis not present

## 2017-12-16 DIAGNOSIS — L564 Polymorphous light eruption: Secondary | ICD-10-CM | POA: Diagnosis not present

## 2017-12-21 DIAGNOSIS — N202 Calculus of kidney with calculus of ureter: Secondary | ICD-10-CM | POA: Diagnosis not present

## 2017-12-29 DIAGNOSIS — Z0000 Encounter for general adult medical examination without abnormal findings: Secondary | ICD-10-CM | POA: Diagnosis not present

## 2017-12-29 DIAGNOSIS — R82998 Other abnormal findings in urine: Secondary | ICD-10-CM | POA: Diagnosis not present

## 2017-12-29 DIAGNOSIS — R7302 Impaired glucose tolerance (oral): Secondary | ICD-10-CM | POA: Diagnosis not present

## 2018-01-05 DIAGNOSIS — Z8241 Family history of sudden cardiac death: Secondary | ICD-10-CM | POA: Diagnosis not present

## 2018-01-05 DIAGNOSIS — F430 Acute stress reaction: Secondary | ICD-10-CM | POA: Diagnosis not present

## 2018-01-05 DIAGNOSIS — R7302 Impaired glucose tolerance (oral): Secondary | ICD-10-CM | POA: Diagnosis not present

## 2018-01-05 DIAGNOSIS — Z0000 Encounter for general adult medical examination without abnormal findings: Secondary | ICD-10-CM | POA: Diagnosis not present

## 2018-01-05 DIAGNOSIS — J302 Other seasonal allergic rhinitis: Secondary | ICD-10-CM | POA: Diagnosis not present

## 2018-01-05 DIAGNOSIS — G43909 Migraine, unspecified, not intractable, without status migrainosus: Secondary | ICD-10-CM | POA: Diagnosis not present

## 2018-01-05 DIAGNOSIS — E781 Pure hyperglyceridemia: Secondary | ICD-10-CM | POA: Diagnosis not present

## 2018-01-05 DIAGNOSIS — N200 Calculus of kidney: Secondary | ICD-10-CM | POA: Diagnosis not present

## 2018-01-05 DIAGNOSIS — J4520 Mild intermittent asthma, uncomplicated: Secondary | ICD-10-CM | POA: Diagnosis not present

## 2018-01-05 DIAGNOSIS — Z1389 Encounter for screening for other disorder: Secondary | ICD-10-CM | POA: Diagnosis not present

## 2018-01-05 MED FILL — SUMAtriptan SUCCINATE 100 M: 100 | 30 days supply | Qty: 8 | Fill #0

## 2018-01-05 MED FILL — BACLOFEN 10 MG TABLET: 10 | 10 days supply | Qty: 10 | Fill #0

## 2018-02-03 DIAGNOSIS — Z6834 Body mass index (BMI) 34.0-34.9, adult: Secondary | ICD-10-CM | POA: Diagnosis not present

## 2018-02-03 DIAGNOSIS — Z01419 Encounter for gynecological examination (general) (routine) without abnormal findings: Secondary | ICD-10-CM | POA: Diagnosis not present

## 2018-02-03 DIAGNOSIS — Z1151 Encounter for screening for human papillomavirus (HPV): Secondary | ICD-10-CM | POA: Diagnosis not present

## 2018-02-05 DIAGNOSIS — F438 Other reactions to severe stress: Secondary | ICD-10-CM | POA: Diagnosis not present

## 2018-02-26 DIAGNOSIS — F438 Other reactions to severe stress: Secondary | ICD-10-CM | POA: Diagnosis not present

## 2018-03-18 MED FILL — BACLOFEN 10 MG TABLET: 10 | 10 days supply | Qty: 10 | Fill #1

## 2018-03-18 MED FILL — SUMAtriptan SUCCINATE 100 M: 100 | 30 days supply | Qty: 8 | Fill #1

## 2018-04-15 DIAGNOSIS — F438 Other reactions to severe stress: Secondary | ICD-10-CM | POA: Diagnosis not present

## 2018-04-15 MED FILL — SERTRALINE HCL 50 MG TABLET: 50 | 90 days supply | Qty: 90 | Fill #0

## 2018-04-19 MED FILL — INDAPAMIDE 1.25 MG TABLET: 1.25 | 90 days supply | Qty: 90 | Fill #0

## 2018-05-14 DIAGNOSIS — F438 Other reactions to severe stress: Secondary | ICD-10-CM | POA: Diagnosis not present

## 2018-05-20 MED FILL — BACLOFEN 10 MG TABS: 10 | 10 days supply | Qty: 10 | Fill #2

## 2018-06-08 MED FILL — BACLOFEN 10 MG TABS: 10 | 10 days supply | Qty: 10 | Fill #0

## 2018-07-12 DIAGNOSIS — F438 Other reactions to severe stress: Secondary | ICD-10-CM | POA: Diagnosis not present

## 2018-07-13 MED FILL — SERTRALINE HCL 50 MG TABLET: 50 | 90 days supply | Qty: 90 | Fill #1

## 2018-07-19 DIAGNOSIS — Z8241 Family history of sudden cardiac death: Secondary | ICD-10-CM | POA: Diagnosis not present

## 2018-07-19 DIAGNOSIS — G43909 Migraine, unspecified, not intractable, without status migrainosus: Secondary | ICD-10-CM | POA: Diagnosis not present

## 2018-07-19 DIAGNOSIS — F430 Acute stress reaction: Secondary | ICD-10-CM | POA: Diagnosis not present

## 2018-07-19 DIAGNOSIS — E781 Pure hyperglyceridemia: Secondary | ICD-10-CM | POA: Diagnosis not present

## 2018-07-19 DIAGNOSIS — M479 Spondylosis, unspecified: Secondary | ICD-10-CM | POA: Diagnosis not present

## 2018-07-19 DIAGNOSIS — F438 Other reactions to severe stress: Secondary | ICD-10-CM | POA: Diagnosis not present

## 2018-07-19 DIAGNOSIS — R7302 Impaired glucose tolerance (oral): Secondary | ICD-10-CM | POA: Diagnosis not present

## 2018-07-19 DIAGNOSIS — J302 Other seasonal allergic rhinitis: Secondary | ICD-10-CM | POA: Diagnosis not present

## 2018-07-19 DIAGNOSIS — J4520 Mild intermittent asthma, uncomplicated: Secondary | ICD-10-CM | POA: Diagnosis not present

## 2018-07-19 MED FILL — FLOVENT HFA 220 MCG INHALER: 220 | 60 days supply | Qty: 12 | Fill #0

## 2018-07-20 MED FILL — VASCEPA 1 GM CAPSULE: 1 | 90 days supply | Qty: 360 | Fill #0

## 2018-07-29 MED FILL — UBRELVY 50 MG TABS: 50 | 10 days supply | Qty: 10 | Fill #0

## 2018-08-23 DIAGNOSIS — M542 Cervicalgia: Secondary | ICD-10-CM | POA: Diagnosis not present

## 2018-08-23 MED FILL — predniSONE 5 MG (21) TBPK: 5 | 6 days supply | Qty: 21 | Fill #0

## 2018-09-29 MED FILL — predniSONE 5 MG (21) TBPK: 5 | 6 days supply | Qty: 21 | Fill #1

## 2018-09-30 DIAGNOSIS — G43909 Migraine, unspecified, not intractable, without status migrainosus: Secondary | ICD-10-CM | POA: Diagnosis not present

## 2018-09-30 DIAGNOSIS — M4302 Spondylolysis, cervical region: Secondary | ICD-10-CM | POA: Diagnosis not present

## 2018-10-06 ENCOUNTER — Telehealth: Payer: Self-pay | Admitting: *Deleted

## 2018-10-06 ENCOUNTER — Encounter: Payer: Self-pay | Admitting: *Deleted

## 2018-10-06 NOTE — Telephone Encounter (Signed)
Spoke with pt and confirmed pt using 2 identifiers. Updated pt's chart in preparation for appt tomorrow. Pt states she has bone spurs in her neck on the L side, has intermittent numbness in her L arm, sees ortho. She thinks her neck is a trigger for migraines. Neck tightening precedes migraines. She also states her child has a seizure disorder but was told it was not epilepsy. States she only seizes when she hits her head, she was tested for a gene and its thought to be that her seizures are related to a migraine disorder (?).   Meds tried (collected from pt & referral notes): Ibuprofen, Imitrex, on Ubrelvy, on Zoloft, Baclofen helps if taken early, Tylenol.

## 2018-10-07 ENCOUNTER — Ambulatory Visit (INDEPENDENT_AMBULATORY_CARE_PROVIDER_SITE_OTHER): Payer: 59 | Admitting: Neurology

## 2018-10-07 ENCOUNTER — Other Ambulatory Visit: Payer: Self-pay

## 2018-10-07 DIAGNOSIS — G43709 Chronic migraine without aura, not intractable, without status migrainosus: Secondary | ICD-10-CM

## 2018-10-07 MED ORDER — ERENUMAB-AOOE 140 MG/ML ~~LOC~~ SOAJ: 140.0000 mg | SUBCUTANEOUS | 11 refills | Status: DC

## 2018-10-07 MED ORDER — RIZATRIPTAN BENZOATE 10 MG PO TBDP: 10.0000 mg | ORAL_TABLET | ORAL | 11 refills | Status: DC | PRN

## 2018-10-07 MED FILL — RIZATRIPTAN 10 MG ODT: 10 | 30 days supply | Qty: 9 | Fill #0

## 2018-10-07 NOTE — Progress Notes (Signed)
[facility]    Provider:  Dr Jaynee Eagles Requesting Provider: Osborne Casco, Fransico Him, MD Primary Care Provider:  Haywood Pao, MD  CC:  Migraines  Virtual Visit via Video Note  I connected with@ on 10/09/18 at  1:00 PM EDT by a video enabled telemedicine application and verified that I am speaking with the correct person using two identifiers. Patient is at home and physician is in the office.   I discussed the limitations of evaluation and management by telemedicine and the availability of in person appointments. The patient expressed understanding and agreed to proceed.  Melvenia Beam, MD  HPI:  Abigail Frank is a 33 y.o. female here as requested by Tisovec, Fransico Him, MD for migraines. PMHx migraine. No family history that she knows of, her mother is deceased. Struggling for years. Started during her menses as a teenager and have worsened over the years. Worse with life stressors. At least 2-3 migraines. She has 25 headache days a month. At least 12 migraine days a month that can be moderately severe or severe. Pounding/pulsating, interrupts her daily life, she has to lay down, turn off the light, nausea, no vomiting, +photo/phonophbia, Can last 24-72 hours. No medication overuse. No aura. Ongoing for over a year. Migraines starts as tension in the neck and creep up the back of her neck, pins and needles, to the right and then all over, pain behind the right eye, right side is the worse side. They can be positional. She wakes with them in the morning. Her left arm goes numb, tried a steroid pack. She follows with an orthopaedist for her neck. Positions of her head can cause the headaches. Neck pain and tightness. No other focal neurologic deficits, associated symptoms, inciting events or modifiable factors.  Meds tried: zoloft. Topamax(made her "crazy"), ubrelvy, baclofen, imitrex..   Review of Systems: Patient complains of symptoms per HPI as well as the  following symptoms: headaches, obesity. Pertinent negatives and positives per HPI. All others negative.   Social History   Socioeconomic History  . Marital status: Married    Spouse name: Not on file  . Number of children: 2  . Years of education: Not on file  . Highest education level: Bachelor's degree (e.g., BA, AB, BS)  Occupational History  . Not on file  Social Needs  . Financial resource strain: Not on file  . Food insecurity:    Worry: Not on file    Inability: Not on file  . Transportation needs:    Medical: Not on file    Non-medical: Not on file  Tobacco Use  . Smoking status: Never Smoker  . Smokeless tobacco: Never Used  Substance and Sexual Activity  . Alcohol use: Yes    Comment: on occasion a glass of wine  . Drug use: No  . Sexual activity: Yes    Birth control/protection: None  Lifestyle  . Physical activity:    Days per week: Not on file    Minutes per session: Not on file  . Stress: Not on file  Relationships  . Social connections:    Talks on phone: Not on file    Gets together: Not on file    Attends religious service: Not on file    Active member of club or organization: Not on file    Attends meetings of clubs or organizations: Not on file    Relationship status: Not on file  . Intimate partner violence:    Fear  of current or ex partner: Not on file    Emotionally abused: Not on file    Physically abused: Not on file    Forced sexual activity: Not on file  Other Topics Concern  . Not on file  Social History Narrative   Lives at home with her family   Left handed   Caffeine: regular, at least 2 cups of coffee daily    Family History  Problem Relation Age of Onset  . Early death Maternal Grandfather 22       ?pulmonary disease  . Hypertension Mother   . Hypothyroidism Mother   . Heart attack Mother   . Diabetes Mother   . Heart disease Mother        heart failure  . Dementia Mother   . Hypertension Father   . Hypothyroidism  Father   . Diabetes Father   . Cancer Maternal Grandmother        colon  . Seizures Child        thinks its genetic, only seizes if she hits her head,thinks its migraine related   . Migraines Neg Hx     Past Medical History:  Diagnosis Date  . Asthma   . Bone spur    cervical L side; L side arm numbness intermittent   . Diabetes in pregnancy   . Gestational diabetes   . Hx of varicella   . IBS (irritable bowel syndrome)   . Kidney stones   . Migraine   . Ovarian cyst   . Postpartum care following vaginal delivery (1/30) 07/15/2013  . Preterm uterine contractions 06/10/2013    Patient Active Problem List   Diagnosis Date Noted  . Chronic migraine without aura without status migrainosus, not intractable 10/09/2018  . Cough variant asthma with ? component UACS 07/14/2017  . Gestational diabetes mellitus, class A2 07/15/2013  . Postpartum care following vaginal delivery (1/30) 07/15/2013  . Preterm uterine contractions 06/10/2013    Past Surgical History:  Procedure Laterality Date  . MOUTH SURGERY      Current Outpatient Medications  Medication Sig Dispense Refill  . albuterol (PROVENTIL HFA;VENTOLIN HFA) 108 (90 BASE) MCG/ACT inhaler Inhale 2 puffs into the lungs every 6 (six) hours as needed for wheezing or shortness of breath.    . baclofen (LIORESAL) 10 MG tablet Take 5 mg by mouth as needed.   2  . Erenumab-aooe (AIMOVIG) 140 MG/ML SOAJ Inject 140 mg into the skin every 30 (thirty) days. 1 pen 11  . fluticasone (FLOVENT HFA) 110 MCG/ACT inhaler Inhale 1 puff into the lungs 2 (two) times daily.    Vanessa Kick Ethyl (VASCEPA PO) Take 1 tablet by mouth 2 (two) times daily.    . indapamide (LOZOL) 1.25 MG tablet Take 1.25 mg by mouth daily.    . rizatriptan (MAXALT-MLT) 10 MG disintegrating tablet Take 1 tablet (10 mg total) by mouth as needed for migraine. May repeat in 2 hours if needed 9 tablet 11  . sertraline (ZOLOFT) 50 MG tablet Take 50 mg by mouth at bedtime.     Marland Kitchen Ubrogepant (UBRELVY) 50 MG TABS Take 50 mg by mouth as needed.     No current facility-administered medications for this visit.     Allergies as of 10/07/2018 - Review Complete 10/06/2018  Allergen Reaction Noted  . Bactrim [sulfamethoxazole-trimethoprim] Hives 11/06/2015  . Vicodin [hydrocodone-acetaminophen] Nausea And Vomiting 04/07/2013    Vitals: There were no vitals taken for this visit. Last Weight:  Wt Readings  from Last 1 Encounters:  10/06/18 196 lb (88.9 kg)   Last Height:   Ht Readings from Last 1 Encounters:  10/06/18 5' 2.5" (1.588 m)     Physical exam: Exam:    Physical exam: Exam: Gen: NAD, conversant      CV: attempted, Could not perform over Web Video. Denies palpitations or chest pain or SOB. VS: Breathing at a normal rate. Weight appears overweight. Not febrile. Eyes: Conjunctivae clear without exudates or hemorrhage  Neuro: Detailed Neurologic Exam  Speech:    Speech is normal; fluent and spontaneous with normal comprehension.  Cognition:    The patient is oriented to person, place, and time;     recent and remote memory intact;     language fluent;     normal attention, concentration,     fund of knowledge Cranial Nerves:    The pupils are equal, round, and reactive to light. Attempted, Cannot perform fundoscopic exam. Visual fields are full to finger confrontation. Extraocular movements are intact.  The face is symmetric with normal sensation. The palate elevates in the midline. Hearing intact. Voice is normal. Shoulder shrug is normal. The tongue has normal motion without fasciculations.   Coordination:    Normal finger to nose  Gait:    Normal native gait  Motor Observation:   no involuntary movements noted. Tone:    Appears normal  Posture:    Posture is normal. normal erect    Strength:    Strength is anti-gravity and symmetric in the upper and lower limbs.      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 Lie down and place a cool compress on your head.  Avoid headache triggers. If certain foods or odors seem to have triggered your migraines in the past, avoid them. A headache diary might help you identify triggers.  Include physical activity in your daily routine. Try a daily walk or other moderate aerobic exercise.  Manage stress. Find healthy ways to cope with the stressors, such as delegating tasks on your to-do list.  Practice relaxation techniques. Try deep breathing, yoga, massage and visualization.  Eat regularly. Eating regularly scheduled meals and maintaining a healthy diet might help prevent headaches. Also, drink plenty of fluids.  Follow a regular sleep schedule. Sleep deprivation might contribute to headaches Consider biofeedback. With this mind-body technique, you learn to control certain bodily functions - such as muscle tension, heart rate and blood pressure - to prevent headaches or reduce headache pain.    Proceed to emergency room if you experience new or worsening symptoms or symptoms do not resolve, if you have new neurologic symptoms or if headache is severe, or for any concerning symptom.   Provided education on: chronic migraine medication overuse headache, chronic migraines, prevention of migraines, behavioral and other nonpharmacologic treatments for headache.   Follow Up Instructions:  I discussed the  assessment and treatment plan with the patient. The patient was provided an opportunity to ask questions and all were answered. The patient agreed with the plan and demonstrated an understanding of the instructions.   The patient was advised to call back or seek an in-person evaluation if the symptoms worsen or if the condition fails to improve as anticipated.  A total of  60 minutes was spent Video face-to-face(not in person) with this patient. Over half this time was spent on counseling patient on the  1. Chronic migraine without aura without status migrainosus, not intractable    diagnosis and different diagnostic and therapeutic options, counseling and coordination of care, risks ans benefits of management, compliance, or risk factor reduction and education.     Meds ordered this encounter  Medications  . rizatriptan (MAXALT-MLT) 10 MG disintegrating tablet    Sig: Take 1 tablet (10 mg total) by mouth as needed for migraine. May repeat in 2 hours if needed    Dispense:  9 tablet    Refill:  11  . Erenumab-aooe (AIMOVIG) 140 MG/ML SOAJ    Sig: Inject 140 mg into the skin every 30 (thirty) days.    Dispense:  1 pen    Refill:  11    Cc: Tisovec, Fransico Him, MD,    Sarina Ill, MD  [facility] [address] [place], [place] [address]  Phone [phone] Fax [phone]

## 2018-10-08 MED FILL — INDAPAMIDE 1.25 MG TABLET: 1.25 | 90 days supply | Qty: 90 | Fill #1

## 2018-10-08 MED FILL — UBRELVY 50 MG TABS: 50 | 30 days supply | Qty: 9 | Fill #0

## 2018-10-08 MED FILL — SERTRALINE HCL 50 MG TABLET: 50 | 90 days supply | Qty: 90 | Fill #0

## 2018-10-09 ENCOUNTER — Encounter: Payer: Self-pay | Admitting: Neurology

## 2018-10-09 DIAGNOSIS — G43709 Chronic migraine without aura, not intractable, without status migrainosus: Secondary | ICD-10-CM | POA: Insufficient documentation

## 2018-10-11 MED FILL — AIMOVIG 140 MG/ML SOAJ: 140 | 30 days supply | Qty: 1 | Fill #0

## 2018-10-12 ENCOUNTER — Telehealth: Payer: Self-pay | Admitting: *Deleted

## 2018-10-12 NOTE — Telephone Encounter (Signed)
Completed PA for Aimovig 140 mg on Cover My Meds. KEY: [id number]. Awaiting [employer] determination.

## 2018-10-13 ENCOUNTER — Encounter: Payer: Self-pay | Admitting: *Deleted

## 2018-10-13 NOTE — Telephone Encounter (Signed)
Per Cover My Meds:  The request has been approved. The authorization is effective for a maximum of 6 fills from 10/11/2018 to 04/11/2019, as long as the member is enrolled in their current health plan. The request was approved as submitted. This request is approved for 21mL (one 140mg /mL syringe) per 30 days. Renewal requires that the patient has experienced a reduction in migraine or headache frequency of at least 2 days per month, OR that the patient has experienced a reduction in migraine severity OR migraine duration with Aimovig therapy. A written notification letter will follow with additional details.   Sent pt a Therapist, music.

## 2018-10-18 NOTE — Telephone Encounter (Signed)
Aimovig approval letter received from [place]. Faxed copy to [facility]. Received a receipt of confirmation.

## 2018-11-09 ENCOUNTER — Telehealth: Payer: Self-pay | Admitting: Neurology

## 2018-11-09 ENCOUNTER — Other Ambulatory Visit: Payer: Self-pay | Admitting: Neurology

## 2018-11-09 ENCOUNTER — Encounter: Payer: Self-pay | Admitting: Neurology

## 2018-11-09 ENCOUNTER — Encounter: Payer: Self-pay | Admitting: *Deleted

## 2018-11-09 MED ORDER — ONDANSETRON 4 MG PO TBDP: 4.0000 mg | ORAL_TABLET | Freq: Four times a day (QID) | ORAL | 3 refills | Status: DC | PRN

## 2018-11-09 MED FILL — ONDANSETRON ODT 4 MG TABLET: 4 | 7 days supply | Qty: 30 | Fill #0

## 2018-11-09 MED FILL — AIMOVIG 140 MG/ML SOAJ: 140 | 30 days supply | Qty: 1 | Fill #1

## 2018-11-09 NOTE — Telephone Encounter (Signed)
I called pt and discussed Dr. Cathren Laine message. She verbalized understanding. I offered a doxy visit on 6/3 but pt unable to do that. I scheduled pt for Wed 6/11 @ 1:00 pm for a doxy visit. Pt understands the visit will be filed with her insurance and there may be limitations with the physical assessment. Pt will use a smart phone with camera. I sent her a text through [email or web address] with Dr. Cathren Laine [facility] doxy link. Pt provided her DOB and updated meds. No changes noted to PMH, allergies, PSH, or social history. She verbalized appreciation for the call.

## 2018-11-09 NOTE — Telephone Encounter (Signed)
Noted. Sent pt mychart message.

## 2018-11-09 NOTE — Telephone Encounter (Signed)
I would need a follow up to currently document why for insurance purposes. Also I have no medical documentation on why she would need an MRI of the cervical spine. Insurance will need it. Can you set up a doxy visit?

## 2018-11-09 NOTE — Telephone Encounter (Signed)
Sure, done thanks. She can also email me and we can talk via mychart email prior to appointment to see if we can do anything else until I tal to her, that is probably the best way to contact me.

## 2018-11-09 NOTE — Telephone Encounter (Signed)
Pt called and stated that she is interested in doing the MRI that was mentioned to her at her last appt and she would like to know if one can be done for her cervical spine area as well. Please advise.

## 2018-11-09 NOTE — Telephone Encounter (Signed)
While on the phone with pt, she asked if Dr. Jaynee Eagles would be willing to prescribe anti-nausea medication which was discussed as a possibility at the last ov. Stated the Rizatriptan causes nausea. She has taken ondansetron ODT in the past.

## 2018-11-24 ENCOUNTER — Ambulatory Visit (INDEPENDENT_AMBULATORY_CARE_PROVIDER_SITE_OTHER): Payer: 59 | Admitting: Neurology

## 2018-11-24 ENCOUNTER — Other Ambulatory Visit: Payer: Self-pay

## 2018-11-24 ENCOUNTER — Telehealth: Payer: Self-pay | Admitting: Neurology

## 2018-11-24 DIAGNOSIS — G5603 Carpal tunnel syndrome, bilateral upper limbs: Secondary | ICD-10-CM | POA: Diagnosis not present

## 2018-11-24 DIAGNOSIS — G43709 Chronic migraine without aura, not intractable, without status migrainosus: Secondary | ICD-10-CM | POA: Diagnosis not present

## 2018-11-24 MED ORDER — GABAPENTIN 300 MG PO CAPS: 300.0000 mg | ORAL_CAPSULE | Freq: Every evening | ORAL | 11 refills | Status: DC | PRN

## 2018-11-24 MED FILL — GABAPENTIN 300 MG CAPSULE: 300 | 30 days supply | Qty: 30 | Fill #0

## 2018-11-24 NOTE — Telephone Encounter (Signed)
Would you mind calling patient and scheduling for emg/ncs please? Thank you

## 2018-11-24 NOTE — Progress Notes (Signed)
[facility]    Provider:  Dr Jaynee Eagles Requesting Provider: Tisovec, Fransico Him, MD Primary Care Provider:  Haywood Pao, MD  CC:  Migraines  Virtual Visit via Video Note  I connected with Abigail Frank on 11/24/18 at  1:00 PM EDT by a video enabled telemedicine application and verified that I am speaking with the correct person using two identifiers.  Location: Patient: home Provider: office   I discussed the limitations of evaluation and management by telemedicine and the availability of in person appointments. The patient expressed understanding and agreed to proceed.   Interval history 11/24/2018:  Follow up for migraines and new symptom hand pain.  Patient reports for several years she has had hand pain numbness and tingling mostly in digits 1 through 3 more so on the left but also on the right.  She wakes up in the middle of the night and has to shake her hands out.  She had an MRI of the cervical spine in 2017 due to the symptoms but did not show etiology.  It is worsening.  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  Positive Phalen's sign.  I discussed carpal tunnel syndrome with patient and we will have her in for an EMG nerve conduction study.  She is doing amazing on Aimovig she loves it continue.  Personally reviewed MRI of th cervical spine and agree with the following 2017:   IMPRESSION: Negative for central canal stenosis. Uncovertebral spurring results in some foraminal narrowing on the left appearing most notable at C3-4 with a very mild degree of foraminal narrowing on the left at C4-5 and C5-6.   HPI:  Abigail Frank is a 33 y.o. female here as requested by Tisovec, Fransico Him, MD for migraines. PMHx migraine. No family history that she knows of, her mother is deceased. Struggling for years. Started during her menses as a teenager and have worsened over the years. Worse with life stressors. At least 2-3 migraines. She has 25 headache  days a month. At least 12 migraine days a month that can be moderately severe or severe. Pounding/pulsating, interrupts her daily life, she has to lay down, turn off the light, nausea, no vomiting, +photo/phonophbia, Can last 24-72 hours. No medication overuse. No aura. Ongoing for over a year. Migraines starts as tension in the neck and creep up the back of her neck, pins and needles, to the right and then all over, pain behind the right eye, right side is the worse side. They can be positional. She wakes with them in the morning. Her left arm goes numb, tried a steroid pack. She follows with an orthopaedist for her neck. Positions of her head can cause the headaches. Neck pain and tightness. No other focal neurologic deficits, associated symptoms, inciting events or modifiable factors.  Meds tried: zoloft. Topamax(made her "crazy"), ubrelvy, baclofen, imitrex..   Review of Systems: Patient complains of symptoms per HPI as well as the following symptoms: headaches, obesity, numbness and tingling. Pertinent negatives and positives per HPI. All others negative.   Social History   Socioeconomic History  . Marital status: Married    Spouse name: Not on file  . Number of children: 2  . Years of education: Not on file  . Highest education level: Bachelor's degree (e.g., BA, AB, BS)  Occupational History  . Not on file  Social Needs  . Financial resource strain: Not on file  . Food insecurity:    Worry:  Not on file    Inability: Not on file  . Transportation needs:    Medical: Not on file    Non-medical: Not on file  Tobacco Use  . Smoking status: Never Smoker  . Smokeless tobacco: Never Used  Substance and Sexual Activity  . Alcohol use: Yes    Comment: on occasion a glass of wine  . Drug use: No  . Sexual activity: Yes    Birth control/protection: None  Lifestyle  . Physical activity:    Days per week: Not on file    Minutes per session: Not on file  . Stress: Not on file   Relationships  . Social connections:    Talks on phone: Not on file    Gets together: Not on file    Attends religious service: Not on file    Active member of club or organization: Not on file    Attends meetings of clubs or organizations: Not on file    Relationship status: Not on file  . Intimate partner violence:    Fear of current or ex partner: Not on file    Emotionally abused: Not on file    Physically abused: Not on file    Forced sexual activity: Not on file  Other Topics Concern  . Not on file  Social History Narrative   Lives at home with her family   Left handed   Caffeine: regular, at least 2 cups of coffee daily    Family History  Problem Relation Age of Onset  . Early death Maternal Grandfather 19       ?pulmonary disease  . Hypertension Mother   . Hypothyroidism Mother   . Heart attack Mother   . Diabetes Mother   . Heart disease Mother        heart failure  . Dementia Mother   . Hypertension Father   . Hypothyroidism Father   . Diabetes Father   . Cancer Maternal Grandmother        colon  . Seizures Child        thinks its genetic, only seizes if she hits her head,thinks its migraine related   . Migraines Neg Hx     Past Medical History:  Diagnosis Date  . Asthma   . Bone spur    cervical L side; L side arm numbness intermittent   . Diabetes in pregnancy   . Gestational diabetes   . Hx of varicella   . IBS (irritable bowel syndrome)   . Kidney stones   . Migraine   . Ovarian cyst   . Postpartum care following vaginal delivery (1/30) 07/15/2013  . Preterm uterine contractions 06/10/2013    Patient Active Problem List   Diagnosis Date Noted  . Chronic migraine without aura without status migrainosus, not intractable 10/09/2018  . Cough variant asthma with ? component UACS 07/14/2017  . Gestational diabetes mellitus, class A2 07/15/2013  . Postpartum care following vaginal delivery (1/30) 07/15/2013  . Preterm uterine contractions  06/10/2013    Past Surgical History:  Procedure Laterality Date  . MOUTH SURGERY      Current Outpatient Medications  Medication Sig Dispense Refill  . albuterol (PROVENTIL HFA;VENTOLIN HFA) 108 (90 BASE) MCG/ACT inhaler Inhale 2 puffs into the lungs every 6 (six) hours as needed for wheezing or shortness of breath.    . baclofen (LIORESAL) 10 MG tablet Take 5 mg by mouth as needed.   2  . Erenumab-aooe (AIMOVIG) 140 MG/ML SOAJ Inject  140 mg into the skin every 30 (thirty) days. 1 pen 11  . fluticasone (FLOVENT HFA) 110 MCG/ACT inhaler Inhale 1 puff into the lungs 2 (two) times daily.    Marland Kitchen gabapentin (NEURONTIN) 300 MG capsule Take 1 capsule (300 mg total) by mouth at bedtime as needed. For numbness and tingling in the hands. 30 capsule 11  . Icosapent Ethyl (VASCEPA PO) Take 1 tablet by mouth 2 (two) times daily.    . indapamide (LOZOL) 1.25 MG tablet Take 1.25 mg by mouth daily.    . ondansetron (ZOFRAN-ODT) 4 MG disintegrating tablet Take 1 tablet (4 mg total) by mouth every 6 (six) hours as needed for nausea. Or for headache. 30 tablet 3  . rizatriptan (MAXALT-MLT) 10 MG disintegrating tablet Take 1 tablet (10 mg total) by mouth as needed for migraine. May repeat in 2 hours if needed 9 tablet 11  . sertraline (ZOLOFT) 50 MG tablet Take 50 mg by mouth at bedtime.    Marland Kitchen Ubrogepant (UBRELVY) 50 MG TABS Take 50 mg by mouth as needed.     No current facility-administered medications for this visit.     Allergies as of 11/24/2018 - Review Complete 11/09/2018  Allergen Reaction Noted  . Bactrim [sulfamethoxazole-trimethoprim] Hives 11/06/2015  . Vicodin [hydrocodone-acetaminophen] Nausea And Vomiting 04/07/2013    Vitals: There were no vitals taken for this visit. Last Weight:  Wt Readings from Last 1 Encounters:  10/06/18 196 lb (88.9 kg)   Last Height:   Ht Readings from Last 1 Encounters:  10/06/18 5' 2.5" (1.588 m)     Physical exam: Exam:    Physical exam: Exam:  Gen: NAD, conversant      CV: attempted, Could not perform over Web Video. Denies palpitations or chest pain or SOB. VS: Breathing at a normal rate. Weight appears overweight. Not febrile. Eyes: Conjunctivae clear without exudates or hemorrhage  Neuro: Detailed Neurologic Exam  Speech:    Speech is normal; fluent and spontaneous with normal comprehension.  Cognition:    The patient is oriented to person, place, and time;     recent and remote memory intact;     language fluent;     normal attention, concentration,     fund of knowledge Cranial Nerves:    The pupils are equal, round, and reactive to light. Attempted, Cannot perform fundoscopic exam. Visual fields are full to finger confrontation. Extraocular movements are intact.  The face is symmetric with normal sensation. The palate elevates in the midline. Hearing intact. Voice is normal. Shoulder shrug is normal. The tongue has normal motion without fasciculations.   Coordination:    Normal finger to nose  Gait:    Normal native gait  Motor Observation:   no involuntary movements noted. Tone:    Appears normal  Posture:    Posture is normal. normal erect    Strength:    Strength is anti-gravity and symmetric in the upper and lower limbs.      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 Lie  down and place a cool compress on your head.  Avoid headache triggers. If certain foods or odors seem to have triggered your migraines in the past, avoid them. A headache diary might help you identify triggers.  Include physical activity in your daily routine. Try a daily walk or other moderate aerobic exercise.  Manage stress. Find healthy ways to cope with the stressors, such as delegating tasks on your to-do list.  Practice relaxation techniques. Try deep breathing, yoga, massage and visualization.  Eat regularly. Eating regularly scheduled meals and maintaining a healthy diet might help prevent headaches. Also, drink plenty of fluids.  Follow a regular sleep schedule. Sleep deprivation might contribute to headaches Consider biofeedback. With this mind-body technique, you learn to control certain bodily functions - such as muscle tension, heart rate and blood pressure - to prevent headaches or reduce headache pain.    Proceed to emergency room if you experience new or worsening symptoms or symptoms do not resolve, if you have new neurologic symptoms or if headache is severe, or for any concerning symptom.   Provided education on: chronic migraine medication overuse headache, chronic migraines, prevention of migraines, behavioral and other nonpharmacologic treatments for headache.     Follow Up Instructions:    I discussed the assessment and treatment plan with the patient. The patient was provided an opportunity to ask questions and all were answered. The patient agreed with the plan and demonstrated an understanding of the instructions.   The patient was advised to call back or seek an in-person evaluation if the symptoms worsen or if the condition fails to improve as anticipated.   Melvenia Beam, MD    Meds ordered this encounter  Medications  . gabapentin (NEURONTIN) 300 MG capsule    Sig: Take 1 capsule (300 mg total) by mouth at bedtime as needed. For numbness and tingling  in the hands.    Dispense:  30 capsule    Refill:  11    Cc: Tisovec, Fransico Him, MD,    Sarina Ill, MD  [facility] [address] [place], [place] [address]  Phone [phone] Fax [phone]

## 2018-11-25 NOTE — Telephone Encounter (Signed)
Called patient and got her ncv/emg for 7/2.

## 2018-12-03 MED FILL — AIMOVIG 140 MG/ML SOAJ: 140 | 30 days supply | Qty: 1 | Fill #2

## 2018-12-13 MED FILL — BACLOFEN 10 MG TABS: 10 | 10 days supply | Qty: 30 | Fill #0

## 2018-12-16 ENCOUNTER — Other Ambulatory Visit: Payer: Self-pay

## 2018-12-16 ENCOUNTER — Ambulatory Visit (INDEPENDENT_AMBULATORY_CARE_PROVIDER_SITE_OTHER): Payer: 59 | Admitting: Neurology

## 2018-12-16 ENCOUNTER — Ambulatory Visit: Payer: 59 | Admitting: Neurology

## 2018-12-16 DIAGNOSIS — R200 Anesthesia of skin: Secondary | ICD-10-CM

## 2018-12-16 DIAGNOSIS — G5602 Carpal tunnel syndrome, left upper limb: Secondary | ICD-10-CM | POA: Diagnosis not present

## 2018-12-16 DIAGNOSIS — R29898 Other symptoms and signs involving the musculoskeletal system: Secondary | ICD-10-CM

## 2018-12-16 DIAGNOSIS — R29818 Other symptoms and signs involving the nervous system: Secondary | ICD-10-CM | POA: Diagnosis not present

## 2018-12-16 DIAGNOSIS — Z0289 Encounter for other administrative examinations: Secondary | ICD-10-CM

## 2018-12-16 DIAGNOSIS — R202 Paresthesia of skin: Secondary | ICD-10-CM

## 2018-12-16 NOTE — Progress Notes (Signed)
Discussed findings. She may have mild left CTS but not enough to explain severity of symptoms. Also, the emg/ncs was normal on the right and does not explain the right hand/arm symptoms. Discussed carpal tunnel syndrome, etiology, pathophysiology, progression. Recommend conservative measures and wrist splints and follow clinically. If worsening in the future will send to orthopaedics. On exam she has left proximal weakness and numbness in the hand. I recommend MRI of the brain and MRI c-spine both w/wo contrast to evaluate for other causes such as radiculopathy, demyelination or MS, other lesions of the brain or cervical spine.  Orders Placed This Encounter  Procedures  . MR BRAIN W WO CONTRAST  . MR CERVICAL SPINE W WO CONTRAST   A total of 15 minutes was spent face-to-face with this patient. Over half this time was spent on counseling patient on the  1. Carpal tunnel syndrome of left wrist   2. Left arm numbness   3. Left arm weakness   4. Left hand weakness   5. Neurologic abnormality    diagnosis and different diagnostic and therapeutic options, counseling and coordination of care, risks ans benefits of management, compliance, or risk factor reduction and education.  This does not include time spent on emg/ncs.

## 2018-12-16 NOTE — Progress Notes (Signed)
See procedure note.

## 2018-12-19 NOTE — Progress Notes (Signed)
Full Name: Abigail Frank Gender: Female MRN #: [id number] Date of Birth: May 31, 1986    Visit Date: 12/16/2018 09:48 Age: 33 Years 68 Months Old Examining Physician: Sarina Ill, MD  Referring Physician: Domenick Gong, MD  History: Bilateral hand pain and numbnes  Summary: Nerve Conduction Studies were performed on the bilateral upper extremities. Left hand > right hand.  The left median/ulnar (palm) comparison nerve showed  abnormal peak latency difference (Median Palm-Ulnar Palm, 0.5 ms, N<0.4) with a relative median delay.  All remaining nerves (as indicated in the following tables) were within normal limits.  All muscles (as indicated in the following tables) were within normal limits.      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   Sarina Ill, M.D.  [facility] [place], [place] [address] Tel: [phone] Fax: [phone]        [facility]    Nerve / Sites Muscle Latency Ref. Amplitude Ref. Rel Amp Segments Distance Velocity Ref. Area    ms ms mV mV %  cm m/s m/s mVms  R Median - APB     Wrist APB 3.1 ?4.4 6.4 ?4.0 100 Wrist - APB 7   25.0     Upper arm APB 6.3  6.3  97.7 Upper arm - Wrist 18 57 ?49 23.5  L Median - APB     Wrist APB 3.4 ?4.4 9.3 ?4.0 100 Wrist - APB 7   32.8     Upper arm APB 6.5  9.1  98.1 Upper arm - Wrist 18 59 ?49 32.6  R Ulnar - ADM     Wrist ADM 2.1 ?3.3 8.6 ?6.0 100 Wrist - ADM 7   23.4     B.Elbow ADM 4.6  8.6  99.9 B.Elbow - Wrist 17 67 ?49 23.5     A.Elbow ADM 6.1  8.5  98.9 A.Elbow - B.Elbow 10 66 ?49 23.4         A.Elbow - Wrist      L Ulnar - ADM     Wrist ADM 2.3 ?3.3 8.6 ?6.0 100 Wrist - ADM 7   23.9     B.Elbow ADM 4.5  8.2  95.8 B.Elbow - Wrist 15 67 ?49 23.4     A.Elbow ADM 6.0  8.3  100  A.Elbow - B.Elbow 10 66 ?49 23.5         A.Elbow - Wrist                 SNC    Nerve / Sites Rec. Site Peak Lat Ref.  Amp Ref. Segments Distance Peak Diff Ref.    ms ms V V  cm ms ms  R Median, Ulnar - Transcarpal comparison     Median Palm Wrist 2.1 ?2.2 46 ?35 Median Palm - Wrist 8       Ulnar Palm Wrist 1.8 ?2.2 20 ?12 Ulnar Palm - Wrist 8          Median Palm - Ulnar Palm  0.3 ?0.4  L Median, Ulnar - Transcarpal comparison     Median Palm Wrist 2.1 ?2.2 45 ?35 Median Palm - Wrist 8       Ulnar Palm Wrist 1.6 ?2.2 18 ?12 Ulnar Palm - Wrist 8  Median Palm - Ulnar Palm  0.5 ?0.4  R Median - Orthodromic (Dig II, Mid palm)     Dig II Wrist 3.0 ?3.4 15 ?10 Dig II - Wrist 13    L Median - Orthodromic (Dig II, Mid palm)     Dig II Wrist 3.0 ?3.4 11 ?10 Dig II - Wrist 13    R Ulnar - Orthodromic, (Dig V, Mid palm)     Dig V Wrist 2.4 ?3.1 10 ?5 Dig V - Wrist 11    L Ulnar - Orthodromic, (Dig V, Mid palm)     Dig V Wrist 2.5 ?3.1 8 ?5 Dig V - Wrist 27                   F  Wave    Nerve F Lat Ref.   ms ms  R Ulnar - ADM 22.2 ?32.0  L Ulnar - ADM 22.0 ?32.0         EMG full       EMG Summary Table    Spontaneous MUAP Recruitment  Muscle IA Fib PSW Fasc Other Amp Dur. Poly Pattern  L. Deltoid Normal None None None _______ Normal Normal Normal Normal  L. Triceps brachii Normal None None None _______ Normal Normal Normal Normal  L. Pronator teres Normal None None None _______ Normal Normal Normal Normal  L. Opponens pollicis Normal None None None _______ Normal Normal Normal Normal  L. First dorsal interosseous Normal None None None _______ Normal Normal Normal Normal  Bilateral Cervical paraspinals (low) Normal None None None _______ Normal Normal Normal Normal

## 2018-12-20 DIAGNOSIS — R34 Anuria and oliguria: Secondary | ICD-10-CM | POA: Diagnosis not present

## 2018-12-20 DIAGNOSIS — N202 Calculus of kidney with calculus of ureter: Secondary | ICD-10-CM | POA: Diagnosis not present

## 2018-12-20 MED FILL — INDAPAMIDE 1.25 MG TABLET: 1.25 | 90 days supply | Qty: 90 | Fill #0

## 2018-12-23 ENCOUNTER — Telehealth: Payer: Self-pay | Admitting: Neurology

## 2018-12-23 NOTE — Telephone Encounter (Signed)
LVM for pt to call back about scheduling mri  [facility] Auth: [place] Ref # [id number]

## 2018-12-26 DIAGNOSIS — G5602 Carpal tunnel syndrome, left upper limb: Secondary | ICD-10-CM | POA: Insufficient documentation

## 2018-12-26 NOTE — Procedures (Signed)
Full Name: Abigail Frank Gender: Female MRN #: [id number] Date of Birth: 05/13/1986    Visit Date: 12/16/2018 09:48 Age: 33 Years 67 Months Old Examining Physician: Sarina Ill, MD  Referring Physician: Domenick Gong, MD  History: Bilateral hand pain and numbnes  Summary: Nerve Conduction Studies were performed on the bilateral upper extremities. Left hand > right hand.  The left median/ulnar (palm) comparison nerve showed  abnormal peak latency difference (Median Palm-Ulnar Palm, 0.5 ms, N<0.4) with a relative median delay.  All remaining nerves (as indicated in the following tables) were within normal limits.  All muscles (as indicated in the following tables) were within normal limits.      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   Sarina Ill, M.D.  [facility] [place], [place] [address] Tel: [phone] Fax: [phone]        [facility]    Nerve / Sites Muscle Latency Ref. Amplitude Ref. Rel Amp Segments Distance Velocity Ref. Area    ms ms mV mV %  cm m/s m/s mVms  R Median - APB     Wrist APB 3.1 ?4.4 6.4 ?4.0 100 Wrist - APB 7   25.0     Upper arm APB 6.3  6.3  97.7 Upper arm - Wrist 18 57 ?49 23.5  L Median - APB     Wrist APB 3.4 ?4.4 9.3 ?4.0 100 Wrist - APB 7   32.8     Upper arm APB 6.5  9.1  98.1 Upper arm - Wrist 18 59 ?49 32.6  R Ulnar - ADM     Wrist ADM 2.1 ?3.3 8.6 ?6.0 100 Wrist - ADM 7   23.4     B.Elbow ADM 4.6  8.6  99.9 B.Elbow - Wrist 17 67 ?49 23.5     A.Elbow ADM 6.1  8.5  98.9 A.Elbow - B.Elbow 10 66 ?49 23.4         A.Elbow - Wrist      L Ulnar - ADM     Wrist ADM 2.3 ?3.3 8.6 ?6.0 100 Wrist - ADM 7   23.9     B.Elbow ADM 4.5  8.2  95.8 B.Elbow - Wrist 15 67 ?49 23.4     A.Elbow ADM 6.0  8.3  100  A.Elbow - B.Elbow 10 66 ?49 23.5         A.Elbow - Wrist                 SNC    Nerve / Sites Rec. Site Peak Lat Ref.  Amp Ref. Segments Distance Peak Diff Ref.    ms ms V V  cm ms ms  R Median, Ulnar - Transcarpal comparison     Median Palm Wrist 2.1 ?2.2 46 ?35 Median Palm - Wrist 8       Ulnar Palm Wrist 1.8 ?2.2 20 ?12 Ulnar Palm - Wrist 8          Median Palm - Ulnar Palm  0.3 ?0.4  L Median, Ulnar - Transcarpal comparison     Median Palm Wrist 2.1 ?2.2 45 ?35 Median Palm - Wrist 8       Ulnar Palm Wrist 1.6 ?2.2 18 ?12 Ulnar Palm - Wrist 8  Median Palm - Ulnar Palm  0.5 ?0.4  R Median - Orthodromic (Dig II, Mid palm)     Dig II Wrist 3.0 ?3.4 15 ?10 Dig II - Wrist 13    L Median - Orthodromic (Dig II, Mid palm)     Dig II Wrist 3.0 ?3.4 11 ?10 Dig II - Wrist 13    R Ulnar - Orthodromic, (Dig V, Mid palm)     Dig V Wrist 2.4 ?3.1 10 ?5 Dig V - Wrist 11    L Ulnar - Orthodromic, (Dig V, Mid palm)     Dig V Wrist 2.5 ?3.1 8 ?5 Dig V - Wrist 30                   F  Wave    Nerve F Lat Ref.   ms ms  R Ulnar - ADM 22.2 ?32.0  L Ulnar - ADM 22.0 ?32.0         EMG full       EMG Summary Table    Spontaneous MUAP Recruitment  Muscle IA Fib PSW Fasc Other Amp Dur. Poly Pattern  L. Deltoid Normal None None None _______ Normal Normal Normal Normal  L. Triceps brachii Normal None None None _______ Normal Normal Normal Normal  L. Pronator teres Normal None None None _______ Normal Normal Normal Normal  L. Opponens pollicis Normal None None None _______ Normal Normal Normal Normal  L. First dorsal interosseous Normal None None None _______ Normal Normal Normal Normal  Bilateral Cervical paraspinals (low) Normal None None None _______ Normal Normal Normal Normal

## 2018-12-28 ENCOUNTER — Other Ambulatory Visit: Payer: Self-pay | Admitting: *Deleted

## 2018-12-28 MED ORDER — ALPRAZOLAM 0.25 MG PO TABS: ORAL_TABLET | ORAL | 0 refills | Status: DC

## 2018-12-28 MED FILL — ALPRAZolam 0.25 MG TABS: 0.25 | 1 days supply | Qty: 4 | Fill #0

## 2018-12-28 NOTE — Telephone Encounter (Signed)
Patient returned my call she is scheduled at [facility] for 01/04/19. She informed me she is claustrophobic and will need something to help her. She is aware to have a driver.

## 2018-12-28 NOTE — Telephone Encounter (Signed)
I sent a Xanax 0.25 mg order to Dr. Jaynee Eagles to review and e-scribe.

## 2018-12-31 DIAGNOSIS — R7302 Impaired glucose tolerance (oral): Secondary | ICD-10-CM | POA: Diagnosis not present

## 2018-12-31 DIAGNOSIS — Z0000 Encounter for general adult medical examination without abnormal findings: Secondary | ICD-10-CM | POA: Diagnosis not present

## 2019-01-04 ENCOUNTER — Ambulatory Visit: Payer: 59

## 2019-01-04 ENCOUNTER — Other Ambulatory Visit: Payer: Self-pay

## 2019-01-04 DIAGNOSIS — R29818 Other symptoms and signs involving the nervous system: Secondary | ICD-10-CM | POA: Diagnosis not present

## 2019-01-04 DIAGNOSIS — R29898 Other symptoms and signs involving the musculoskeletal system: Secondary | ICD-10-CM

## 2019-01-04 DIAGNOSIS — R200 Anesthesia of skin: Secondary | ICD-10-CM | POA: Diagnosis not present

## 2019-01-04 DIAGNOSIS — G5602 Carpal tunnel syndrome, left upper limb: Secondary | ICD-10-CM

## 2019-01-04 MED ORDER — GADOBENATE DIMEGLUMINE 529 MG/ML IV SOLN
18.0000 mL | Freq: Once | INTRAVENOUS | Status: AC | PRN
  Administered 2019-01-04: 18 mL via INTRAVENOUS

## 2019-01-07 DIAGNOSIS — Z8241 Family history of sudden cardiac death: Secondary | ICD-10-CM | POA: Diagnosis not present

## 2019-01-07 DIAGNOSIS — G43909 Migraine, unspecified, not intractable, without status migrainosus: Secondary | ICD-10-CM | POA: Diagnosis not present

## 2019-01-07 DIAGNOSIS — F430 Acute stress reaction: Secondary | ICD-10-CM | POA: Diagnosis not present

## 2019-01-07 DIAGNOSIS — J4520 Mild intermittent asthma, uncomplicated: Secondary | ICD-10-CM | POA: Diagnosis not present

## 2019-01-07 DIAGNOSIS — Z0000 Encounter for general adult medical examination without abnormal findings: Secondary | ICD-10-CM | POA: Diagnosis not present

## 2019-01-07 DIAGNOSIS — M479 Spondylosis, unspecified: Secondary | ICD-10-CM | POA: Diagnosis not present

## 2019-01-07 DIAGNOSIS — R7302 Impaired glucose tolerance (oral): Secondary | ICD-10-CM | POA: Diagnosis not present

## 2019-01-07 DIAGNOSIS — J302 Other seasonal allergic rhinitis: Secondary | ICD-10-CM | POA: Diagnosis not present

## 2019-01-07 DIAGNOSIS — M4302 Spondylolysis, cervical region: Secondary | ICD-10-CM | POA: Diagnosis not present

## 2019-01-10 MED FILL — RIZATRIPTAN 10 MG ODT: 10 | 30 days supply | Qty: 9 | Fill #1

## 2019-01-10 MED FILL — AIMOVIG 140 MG/ML SOAJ: 140 | 30 days supply | Qty: 1 | Fill #3

## 2019-01-10 MED FILL — GABAPENTIN 300 MG CAPSULE: 300 | 30 days supply | Qty: 30 | Fill #1

## 2019-01-10 MED FILL — SERTRALINE HCL 50 MG TABLET: 50 | 90 days supply | Qty: 90 | Fill #1

## 2019-01-11 DIAGNOSIS — L0889 Other specified local infections of the skin and subcutaneous tissue: Secondary | ICD-10-CM | POA: Diagnosis not present

## 2019-01-11 MED FILL — DOXYCYCLINE HYCLATE 100 MG: 100 | 10 days supply | Qty: 20 | Fill #0

## 2019-01-14 DIAGNOSIS — R2231 Localized swelling, mass and lump, right upper limb: Secondary | ICD-10-CM | POA: Diagnosis not present

## 2019-02-09 MED FILL — AIMOVIG 140 MG/ML SOAJ: 140 | 30 days supply | Qty: 1 | Fill #4

## 2019-02-22 MED FILL — GABAPENTIN 300 MG CAPSULE: 300 | 30 days supply | Qty: 30 | Fill #2

## 2019-02-22 MED FILL — BACLOFEN 10 MG TABS: 10 | 10 days supply | Qty: 30 | Fill #0

## 2019-03-01 DIAGNOSIS — R2231 Localized swelling, mass and lump, right upper limb: Secondary | ICD-10-CM | POA: Diagnosis not present

## 2019-03-10 ENCOUNTER — Other Ambulatory Visit: Payer: Self-pay | Admitting: Neurology

## 2019-03-10 MED ORDER — METHYLPREDNISOLONE 4 MG PO TBPK: ORAL_TABLET | ORAL | 1 refills | Status: DC

## 2019-03-10 MED ORDER — TIZANIDINE HCL 4 MG PO TABS: 4.0000 mg | ORAL_TABLET | Freq: Four times a day (QID) | ORAL | 6 refills | Status: DC | PRN

## 2019-03-10 MED ORDER — METOCLOPRAMIDE HCL 10 MG PO TABS: ORAL_TABLET | ORAL | 1 refills | Status: DC

## 2019-03-10 MED ORDER — METHYLPREDNISOLONE 4 MG PO TBPK
ORAL_TABLET | ORAL | 1 refills | Status: DC
Start: 2019-03-10 — End: 2019-10-31

## 2019-03-10 MED FILL — RIZATRIPTAN 10 MG ODT: 10 | 30 days supply | Qty: 9 | Fill #2

## 2019-03-31 ENCOUNTER — Telehealth: Payer: Self-pay | Admitting: *Deleted

## 2019-03-31 ENCOUNTER — Encounter: Payer: Self-pay | Admitting: *Deleted

## 2019-03-31 NOTE — Telephone Encounter (Signed)
Aimovig PA in progress on CMM. Key: [id number].

## 2019-04-01 MED FILL — GABAPENTIN 300 MG CAPSULE: 300 | 30 days supply | Qty: 30 | Fill #3

## 2019-04-03 MED FILL — RIZATRIPTAN 10 MG ODT: 10 | 30 days supply | Qty: 9 | Fill #3

## 2019-04-14 MED FILL — SERTRALINE HCL 50 MG TABLET: 50 | 90 days supply | Qty: 90 | Fill #0

## 2019-04-14 MED FILL — AIMOVIG 140 MG/ML SOAJ: 140 | 30 days supply | Qty: 1 | Fill #6

## 2019-05-02 NOTE — Telephone Encounter (Signed)
Per CMM: The request has been approved. The authorization is effective for a maximum of 12 fills from 04/14/2019 to 04/12/2020, as long as the member is enrolled in their current health plan. The request was approved as submitted. This request is approved for 75mL per 30 days. A written notification letter will follow with additional details.   Updated pt in mychart.

## 2019-05-04 MED FILL — GABAPENTIN 300 MG CAPSULE: 300 | 30 days supply | Qty: 30 | Fill #4

## 2019-05-07 ENCOUNTER — Other Ambulatory Visit: Payer: Self-pay

## 2019-05-07 DIAGNOSIS — Z20822 Contact with and (suspected) exposure to covid-19: Secondary | ICD-10-CM

## 2019-05-09 LAB — NOVEL CORONAVIRUS, NAA: SARS-CoV-2, NAA: NOT DETECTED

## 2019-05-10 MED FILL — AIMOVIG 140 MG/ML SOAJ: 140 | 30 days supply | Qty: 1 | Fill #7

## 2019-05-10 NOTE — Telephone Encounter (Signed)
Received approval letter from [facility]. Faxed to pharmacy. Received a receipt of confirmation.  PA # [id number]

## 2019-05-17 MED FILL — RIZATRIPTAN 10 MG ODT: 10 | 30 days supply | Qty: 9 | Fill #4

## 2019-05-17 MED FILL — tiZANidine HCL 4 MG TABS: 4 | 15 days supply | Qty: 60 | Fill #0

## 2019-05-26 MED FILL — METHYLPREDNISOLONE 4 MG TBP: 4 | 6 days supply | Qty: 21 | Fill #0

## 2019-05-31 MED FILL — BACLOFEN 10 MG TABS: 10 | 10 days supply | Qty: 30 | Fill #1

## 2019-05-31 MED FILL — GABAPENTIN 300 MG CAPSULE: 300 | 30 days supply | Qty: 30 | Fill #5

## 2019-06-02 MED FILL — ONDANSETRON ODT 4 MG TABLET: 4 | 7 days supply | Qty: 30 | Fill #1

## 2019-06-13 MED FILL — AIMOVIG 140 MG/ML SOAJ: 140 | 30 days supply | Qty: 1 | Fill #8

## 2019-06-20 MED FILL — AMOX-CLAV 875-125 MG TABLET: 875-125 | 7 days supply | Qty: 14 | Fill #0

## 2019-07-04 MED FILL — GABAPENTIN 300 MG CAPSULE: 300 | 30 days supply | Qty: 30 | Fill #6

## 2019-07-08 MED FILL — RIZATRIPTAN 10 MG ODT: 10 | 30 days supply | Qty: 9 | Fill #5

## 2019-07-08 MED FILL — SERTRALINE HCL 50 MG TABLET: 50 | 90 days supply | Qty: 90 | Fill #1

## 2019-07-13 DIAGNOSIS — J302 Other seasonal allergic rhinitis: Secondary | ICD-10-CM | POA: Diagnosis not present

## 2019-07-13 DIAGNOSIS — F438 Other reactions to severe stress: Secondary | ICD-10-CM | POA: Diagnosis not present

## 2019-07-13 DIAGNOSIS — F430 Acute stress reaction: Secondary | ICD-10-CM | POA: Diagnosis not present

## 2019-07-13 DIAGNOSIS — J4520 Mild intermittent asthma, uncomplicated: Secondary | ICD-10-CM | POA: Diagnosis not present

## 2019-07-13 DIAGNOSIS — M4302 Spondylolysis, cervical region: Secondary | ICD-10-CM | POA: Diagnosis not present

## 2019-07-13 DIAGNOSIS — E781 Pure hyperglyceridemia: Secondary | ICD-10-CM | POA: Diagnosis not present

## 2019-07-13 DIAGNOSIS — R7302 Impaired glucose tolerance (oral): Secondary | ICD-10-CM | POA: Diagnosis not present

## 2019-07-13 DIAGNOSIS — G43909 Migraine, unspecified, not intractable, without status migrainosus: Secondary | ICD-10-CM | POA: Diagnosis not present

## 2019-07-13 DIAGNOSIS — L0889 Other specified local infections of the skin and subcutaneous tissue: Secondary | ICD-10-CM | POA: Diagnosis not present

## 2019-08-03 MED FILL — GABAPENTIN 300 MG CAPSULE: 300 | 30 days supply | Qty: 30 | Fill #7

## 2019-08-03 MED FILL — tiZANidine HCL 4 MG TABS: 4 | 15 days supply | Qty: 60 | Fill #1

## 2019-08-13 IMAGING — CT CT ANGIO CHEST
2 of 7 series · 19 of 36 positions shown · IV contrast (iopamidol)
Comparison: Chest radiograph performed 06/16/2016

CLINICAL DATA: Acute onset of shortness of breath and cough.

EXAM:
CT ANGIOGRAPHY CHEST WITH CONTRAST
TECHNIQUE: Multidetector CT imaging of the chest was performed using the
standard protocol during bolus administration of intravenous
contrast. Multiplanar CT image reconstructions and MIPs were
obtained to evaluate the vascular anatomy.
CONTRAST:  100mL B04RBR-WED IOPAMIDOL (B04RBR-WED) INJECTION 76%

[Series 6: pe coronal mpr · coronal · 0.58mm/px · 1 of 106 slices shown]
[im 53/106  mediastinal]
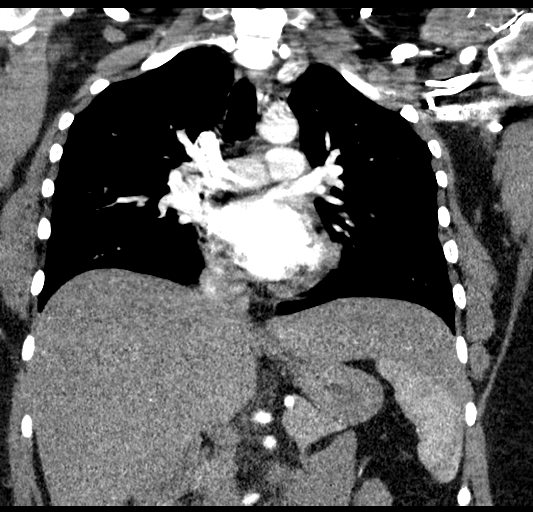

[Series 10: pe thins · axial · 0.58mm/px · z∈[-325,-65]mm · 18 of 290 slices shown]
[im 15/290  lung]
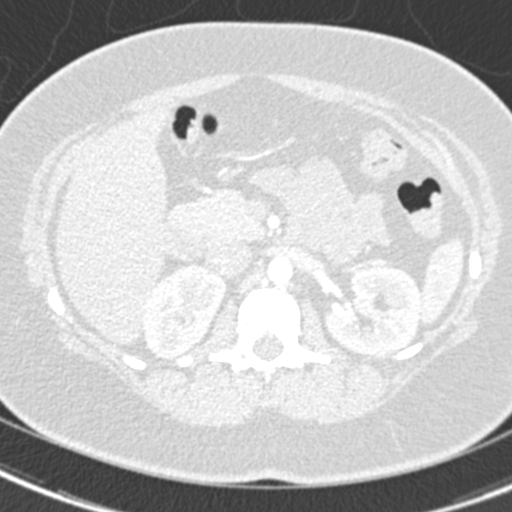
[im 29/290  mediastinal]
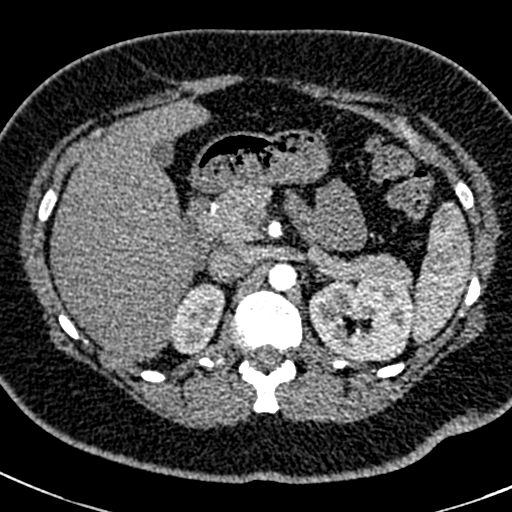
[im 44/290  lung]
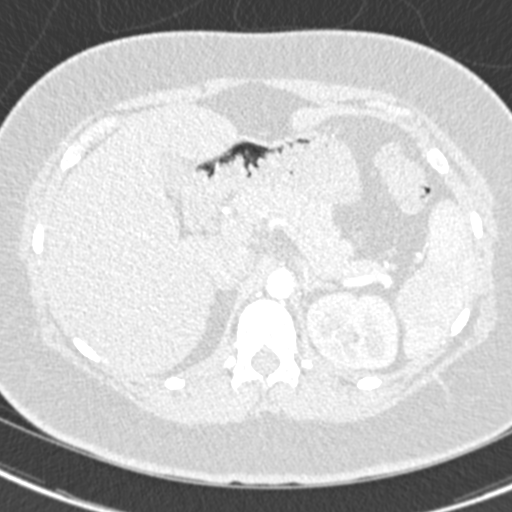
[im 58/290  mediastinal]
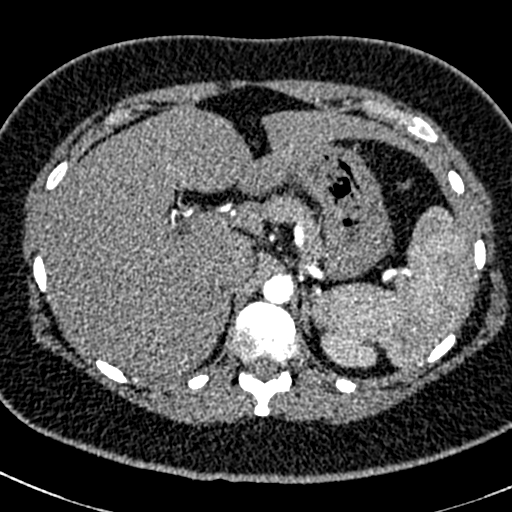
[im 73/290  lung]
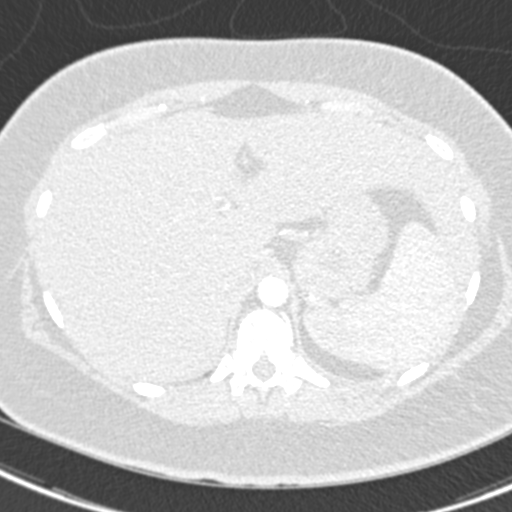
[im 87/290  mediastinal]
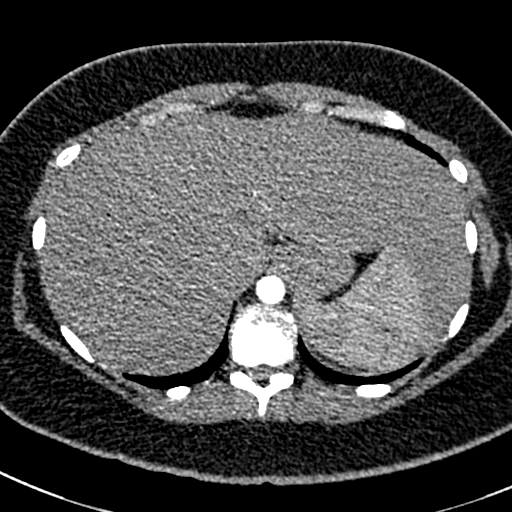
[im 102/290  lung]
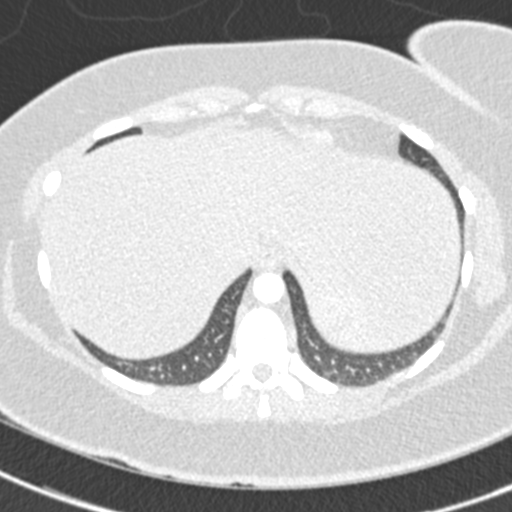
[im 116/290  mediastinal]
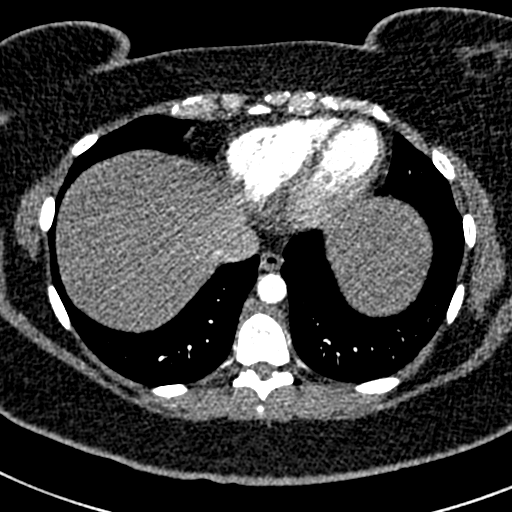
[im 131/290  lung]
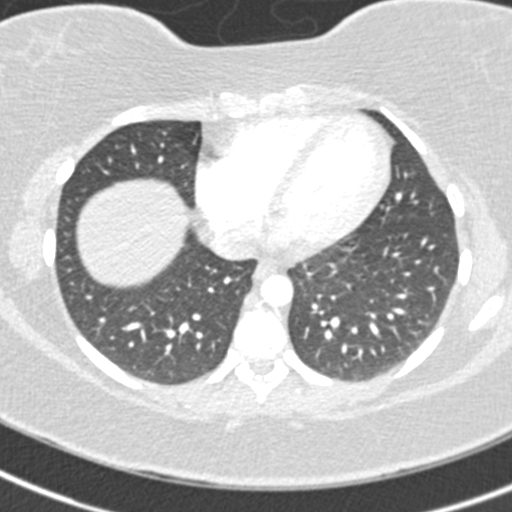
[im 159/290  mediastinal]
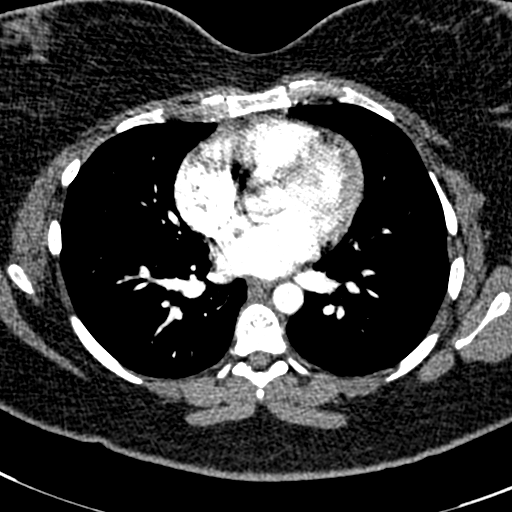
[im 174/290  lung]
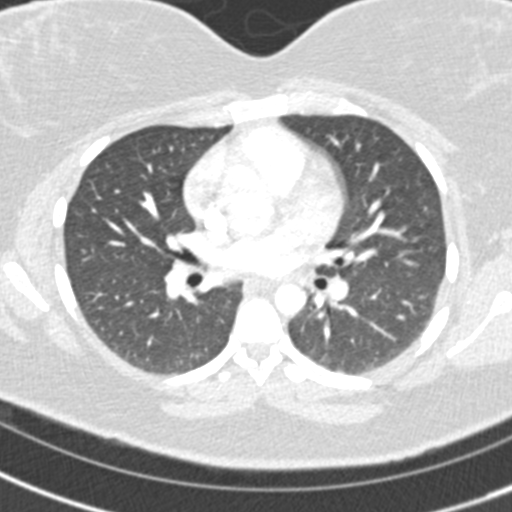
[im 188/290  mediastinal]
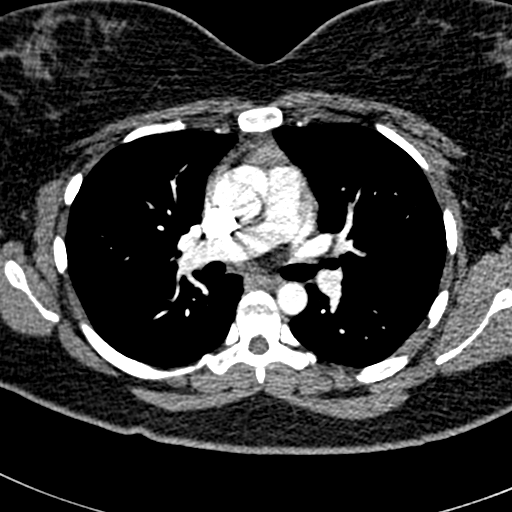
[im 203/290  lung]
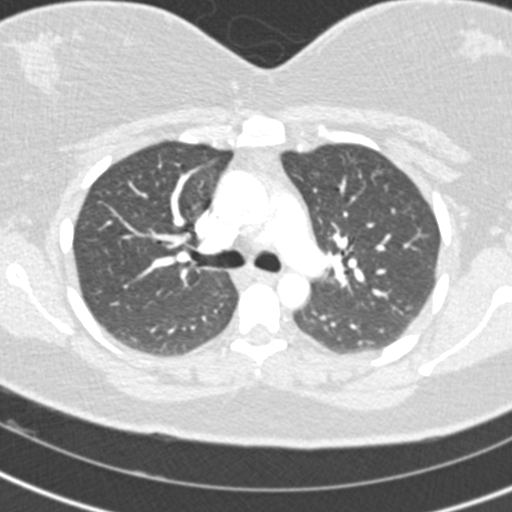
[im 217/290  mediastinal]
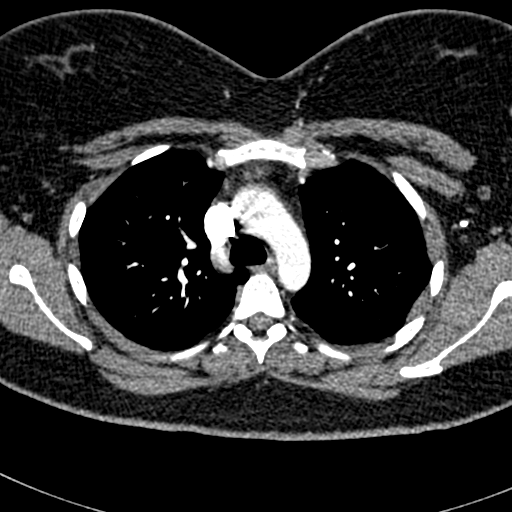
[im 232/290  lung]
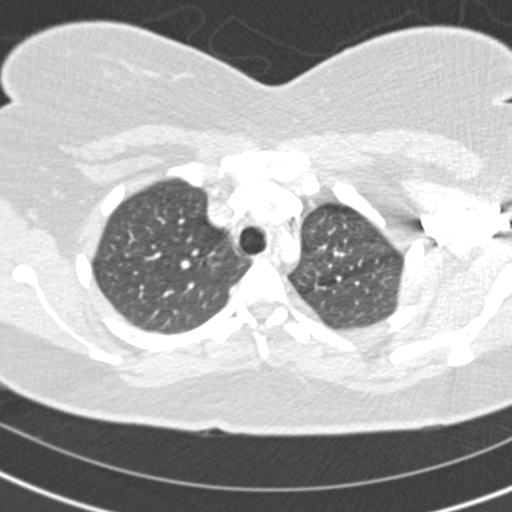
[im 246/290  mediastinal]
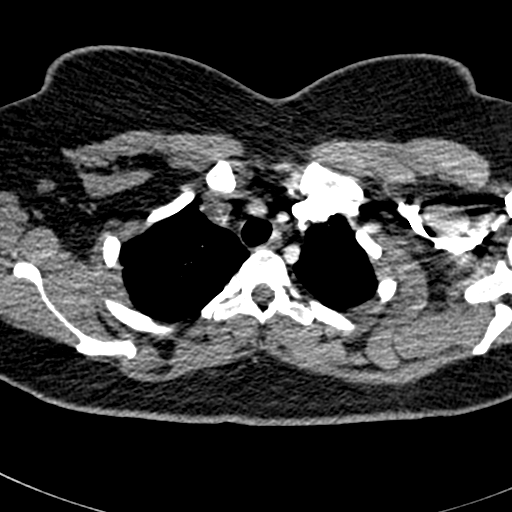
[im 261/290  lung]
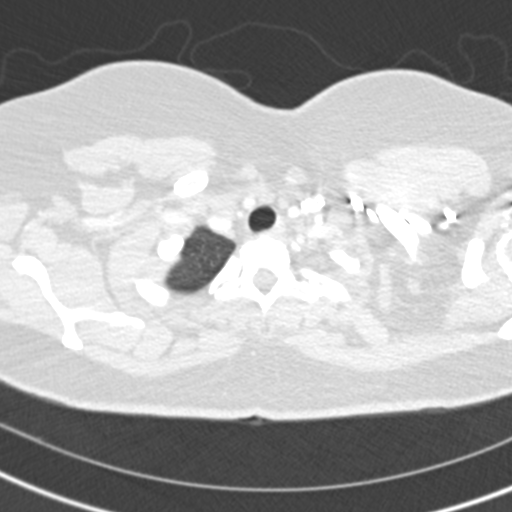
[im 275/290  mediastinal]
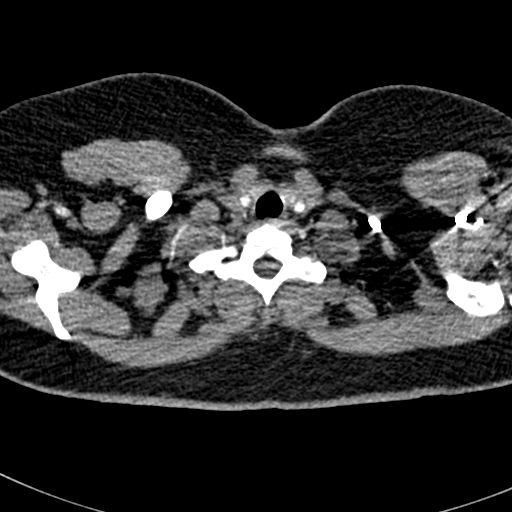

[19 of 36 positions shown; findings below may reference images not displayed]

FINDINGS: Cardiovascular: There is no evidence of significant pulmonary
embolus. Evaluation for pulmonary embolus is mildly suboptimal due
to limitations in the timing of the contrast bolus.

The heart is normal in size. The thoracic aorta is unremarkable. The
great vessels are within normal limits.

Mediastinum/Nodes: The mediastinum is unremarkable in appearance. No
mediastinal lymphadenopathy is seen. No pericardial effusion is
identified. The visualized portions of the thyroid gland are
unremarkable. No axillary lymphadenopathy is appreciated.

Lungs/Pleura: The lungs are clear bilaterally. No focal
consolidation, pleural effusion or pneumothorax is seen. No masses
are identified.

Upper Abdomen: The visualized portions of the liver and spleen are
unremarkable. The visualized portions of the pancreas, adrenal
glands and kidneys are within normal limits. The gallbladder is
unremarkable in appearance.

Musculoskeletal: No acute osseous abnormalities are identified. The
visualized musculature is unremarkable in appearance.

Review of the MIP images confirms the above findings.
IMPRESSION: 1. No evidence of significant pulmonary embolus.
2. Lungs clear bilaterally.

## 2019-08-18 MED FILL — AIMOVIG 140 MG/ML SOAJ: 140 | 30 days supply | Qty: 1 | Fill #9

## 2019-08-18 MED FILL — INDAPAMIDE 1.25 MG TABLET: 1.25 | 90 days supply | Qty: 90 | Fill #1

## 2019-08-26 MED FILL — SERTRALINE HCL 100 MG TAB: 100 | 90 days supply | Qty: 90 | Fill #0

## 2019-09-02 MED FILL — RIZATRIPTAN 10 MG ODT: 10 | 30 days supply | Qty: 9 | Fill #6

## 2019-09-02 MED FILL — GABAPENTIN 300 MG CAPSULE: 300 | 30 days supply | Qty: 30 | Fill #8

## 2019-09-27 MED FILL — BACLOFEN 10 MG TABS: 10 | 10 days supply | Qty: 30 | Fill #2

## 2019-09-27 MED FILL — ONDANSETRON ODT 4 MG TABLET: 4 | 7 days supply | Qty: 30 | Fill #2

## 2019-09-27 MED FILL — UBRELVY 50 MG TABS: 50 | 30 days supply | Qty: 10 | Fill #1

## 2019-09-27 MED FILL — AIMOVIG 140 MG/ML SOAJ: 140 | 30 days supply | Qty: 1 | Fill #10

## 2019-10-03 MED FILL — GABAPENTIN 300 MG CAPSULE: 300 | 30 days supply | Qty: 30 | Fill #9

## 2019-10-21 ENCOUNTER — Other Ambulatory Visit: Payer: Self-pay | Admitting: Neurology

## 2019-10-21 DIAGNOSIS — G43709 Chronic migraine without aura, not intractable, without status migrainosus: Secondary | ICD-10-CM

## 2019-10-21 MED FILL — tiZANidine HCL 4 MG TABS: 4 | 15 days supply | Qty: 60 | Fill #2

## 2019-10-23 ENCOUNTER — Other Ambulatory Visit: Payer: Self-pay | Admitting: Neurology

## 2019-10-27 DIAGNOSIS — H5213 Myopia, bilateral: Secondary | ICD-10-CM | POA: Diagnosis not present

## 2019-10-31 ENCOUNTER — Other Ambulatory Visit (HOSPITAL_COMMUNITY)
Admission: RE | Admit: 2019-10-31 | Discharge: 2019-10-31 | Disposition: A | Discharge status: Home / Self Care | Payer: 59 | Source: Ambulatory Visit | Attending: Obstetrics & Gynecology | Admitting: Obstetrics & Gynecology

## 2019-10-31 ENCOUNTER — Other Ambulatory Visit: Payer: Self-pay

## 2019-10-31 ENCOUNTER — Ambulatory Visit (INDEPENDENT_AMBULATORY_CARE_PROVIDER_SITE_OTHER): Payer: 59 | Admitting: Obstetrics & Gynecology

## 2019-10-31 ENCOUNTER — Encounter: Payer: Self-pay | Admitting: Obstetrics & Gynecology

## 2019-10-31 VITALS — BP 114/74 | HR 72 | Temp 97.2°F | Ht 62.75 in | Wt 204.0 lb

## 2019-10-31 DIAGNOSIS — Z124 Encounter for screening for malignant neoplasm of cervix: Secondary | ICD-10-CM

## 2019-10-31 DIAGNOSIS — N920 Excessive and frequent menstruation with regular cycle: Secondary | ICD-10-CM | POA: Diagnosis not present

## 2019-10-31 DIAGNOSIS — Z8632 Personal history of gestational diabetes: Secondary | ICD-10-CM | POA: Diagnosis not present

## 2019-10-31 DIAGNOSIS — Z01419 Encounter for gynecological examination (general) (routine) without abnormal findings: Secondary | ICD-10-CM | POA: Diagnosis not present

## 2019-10-31 DIAGNOSIS — L680 Hirsutism: Secondary | ICD-10-CM

## 2019-10-31 DIAGNOSIS — L749 Eccrine sweat disorder, unspecified: Secondary | ICD-10-CM | POA: Diagnosis not present

## 2019-10-31 MED ORDER — NORETHIN ACE-ETH ESTRAD-FE 1-20 MG-MCG PO TABS: 1.0000 | ORAL_TABLET | Freq: Every day | ORAL | 1 refills | Status: DC

## 2019-10-31 MED ORDER — LIDOCAINE 5 % EX OINT
TOPICAL_OINTMENT | CUTANEOUS | 0 refills | Status: DC
Start: 2019-10-31 — End: 2020-11-05

## 2019-10-31 MED FILL — BLISOVI FE 1/20 1-20 MG-MCG: 1-20 | 84 days supply | Qty: 112 | Fill #0

## 2019-10-31 MED FILL — LIDOCAINE 5% OINTMENT: 5 | 14 days supply | Qty: 30 | Fill #0

## 2019-10-31 NOTE — Progress Notes (Signed)
34 y.o. G57P2002 Married White or Caucasian female here as a new patient for an annual exam.  Patient would like to discuss having heavy menses since having children.  Husband went through consultation for vasectomy but pt is not sure he is going to go through with it.  Youngest child at TBI and now has seizure d/o.  She's been intubated three times.  Cycles are regular.  Flow starts off heavy, tapers off and then "the flood gates open".  During these times, she will need to change pads  Pt had gestational diabetes with both pregnancies.    Experiencing excessive sweating.  Having skin lesions associated that come up very quickly and then almost make a cavity in the skin.  Do heal but causes scarring.  PCP:  Dr. Osborne Casco.    Patient's last menstrual period was 10/04/2019.          Sexually active: Yes.    The current method of family planning is none.    Exercising: Yes.    walking Smoker:  no  Health Maintenance: Pap:  About 3 years ago per patient done at [facility] History of abnormal Pap:  no TDaP:  2014 Screening Labs: PCP   reports that she has never smoked. She has never used smokeless tobacco. She reports current alcohol use. She reports that she does not use drugs.  Past Medical History:  Diagnosis Date  . Anxiety   . Asthma   . Bone spur    cervical L side; L side arm numbness intermittent   . Depression   . Diabetes in pregnancy   . Gestational diabetes   . Hx of varicella   . IBS (irritable bowel syndrome)   . Kidney stones   . Migraine   . Migraines   . Ovarian cyst   . Postpartum care following vaginal delivery (1/30) 07/15/2013  . Preterm uterine contractions 06/10/2013    Past Surgical History:  Procedure Laterality Date  . MOUTH SURGERY     wisdom teeth extractions    Current Outpatient Medications  Medication Sig Dispense Refill  . AIMOVIG 140 MG/ML SOAJ INJECT 140 MG INTO THE SKIN EVERY 30 DAYS. 1 mL 11  . albuterol (PROVENTIL HFA;VENTOLIN HFA)  108 (90 BASE) MCG/ACT inhaler Inhale 2 puffs into the lungs every 6 (six) hours as needed for wheezing or shortness of breath.    . baclofen (LIORESAL) 10 MG tablet Take 5 mg by mouth as needed.   2  . gabapentin (NEURONTIN) 300 MG capsule Take 1 capsule (300 mg total) by mouth at bedtime as needed. For numbness and tingling in the hands. 30 capsule 11  . Icosapent Ethyl (VASCEPA PO) Take 1 tablet by mouth 2 (two) times daily.    . indapamide (LOZOL) 1.25 MG tablet Take 1.25 mg by mouth daily.    . metoCLOPramide (REGLAN) 10 MG tablet Take three times a day while having migraine or nausea. 90 tablet 1  . ondansetron (ZOFRAN-ODT) 4 MG disintegrating tablet Take 1 tablet (4 mg total) by mouth every 6 (six) hours as needed for nausea. Or for headache. 30 tablet 3  . rizatriptan (MAXALT-MLT) 10 MG disintegrating tablet Take 1 tablet (10 mg total) by mouth as needed for migraine. May repeat in 2 hours if needed 9 tablet 11  . sertraline (ZOLOFT) 100 MG tablet Take 100 mg by mouth at bedtime.     Marland Kitchen tiZANidine (ZANAFLEX) 4 MG tablet Take 1 tablet (4 mg total) by mouth every 6 (  six) hours as needed. For migraines/headaches or muscle pain. 60 tablet 6  . Ubrogepant (UBRELVY) 50 MG TABS Take 50 mg by mouth as needed.    . fluticasone (FLOVENT HFA) 110 MCG/ACT inhaler Inhale 1 puff into the lungs 2 (two) times daily.     No current facility-administered medications for this visit.    Family History  Problem Relation Age of Onset  . Early death Maternal Grandfather 2       ?pulmonary disease  . Pulmonary fibrosis Maternal Grandfather   . Hypertension Mother   . Hypothyroidism Mother   . Heart attack Mother   . Diabetes Mother   . Heart disease Mother        heart failure  . Dementia Mother   . Kidney disease Mother   . Hypertension Father   . Hypothyroidism Father   . Diabetes Father   . Obesity Father   . Cancer Maternal Grandmother        colon  . Dementia Maternal Grandmother   .  Hypertension Maternal Grandmother   . Seizures Child        thinks its genetic, only seizes if she hits her head,thinks its migraine related   . Migraines Neg Hx     Review of Systems  All other systems reviewed and are negative.   Exam:   BP 114/74 (BP Location: Right Arm, Patient Position: Sitting, Cuff Size: Normal)   Pulse 72   Temp (!) 97.2 F (36.2 C) (Temporal)   Ht 5' 2.75" (1.594 m)   Wt 204 lb (92.5 kg)   LMP 10/04/2019   BMI 36.43 kg/m   Height: 5' 2.75" (159.4 cm)  General appearance: alert, cooperative and appears stated age Head: Normocephalic, without obvious abnormality, atraumatic Neck: no adenopathy, supple, symmetrical, trachea midline and thyroid normal to inspection and palpation Lungs: clear to auscultation bilaterally Breasts: normal appearance, no masses or tenderness Heart: regular rate and rhythm Abdomen: soft, non-tender; bowel sounds normal; no masses,  no organomegaly Extremities: extremities normal, atraumatic, no cyanosis or edema Skin: Skin color, texture, turgor normal. No rashes or lesions Lymph nodes: Cervical, supraclavicular, and axillary nodes normal. No abnormal inguinal nodes palpated Neurologic: Grossly normal  Pelvic: External genitalia:  no lesions              Urethra:  normal appearing urethra with no masses, tenderness or lesions              Bartholins and Skenes: normal                 Vagina: normal appearing vagina with normal color and discharge, no lesions              Cervix: no lesions              Pap taken: Yes.   Bimanual Exam:  Uterus:  normal size, contour, position, consistency, mobility, non-tender              Adnexa: normal adnexa and no mass, fullness, tenderness               Rectovaginal: Confirms               Anus:  normal sphincter tone, no lesions  Chaperone, Terence Lux, CMA, was present for exam.  A:  Well Woman with normal exam Menorrhagia Excessive sweating Ulcerative skin lesions that seem  in areas of excess sweating, painful but do heal  P:   Mammogram guidelines reviewed pap  smear with HR HPV obtained today Treatment for bleeding discussed.  Pt would like to start OCPs.  Risks/benefits reviewed.  Loestrin 1/20 daily, continuous active discussed, skipping placebo pills CBC, CMP, TSH, testosterone, iron studies, HbA1C obtained today Return annually or prn

## 2019-11-01 ENCOUNTER — Other Ambulatory Visit: Payer: Self-pay | Admitting: Neurology

## 2019-11-01 LAB — CYTOLOGY - PAP
Adequacy: ABSENT
Comment: NEGATIVE
Diagnosis: NEGATIVE
High risk HPV: NEGATIVE

## 2019-11-01 MED FILL — RIZATRIPTAN 10 MG ODT: 10 | 30 days supply | Qty: 9 | Fill #0

## 2019-11-01 MED FILL — GABAPENTIN 300 MG CAPSULE: 300 | 30 days supply | Qty: 30 | Fill #10

## 2019-11-03 ENCOUNTER — Other Ambulatory Visit: Payer: Self-pay | Admitting: Adult Health

## 2019-11-03 ENCOUNTER — Ambulatory Visit: Payer: 59 | Admitting: Adult Health

## 2019-11-03 ENCOUNTER — Encounter: Payer: Self-pay | Admitting: Adult Health

## 2019-11-03 ENCOUNTER — Other Ambulatory Visit: Payer: Self-pay

## 2019-11-03 VITALS — BP 127/74 | HR 90 | Ht 62.0 in | Wt 204.0 lb

## 2019-11-03 DIAGNOSIS — M436 Torticollis: Secondary | ICD-10-CM

## 2019-11-03 DIAGNOSIS — M6289 Other specified disorders of muscle: Secondary | ICD-10-CM

## 2019-11-03 DIAGNOSIS — G43709 Chronic migraine without aura, not intractable, without status migrainosus: Secondary | ICD-10-CM

## 2019-11-03 LAB — COMPREHENSIVE METABOLIC PANEL
ALT: 24 IU/L (ref 0–32)
AST: 21 IU/L (ref 0–40)
Albumin/Globulin Ratio: 2 (ref 1.2–2.2)
Albumin: 4.5 g/dL (ref 3.8–4.8)
Alkaline Phosphatase: 104 IU/L (ref 48–121)
BUN/Creatinine Ratio: 16 (ref 9–23)
BUN: 11 mg/dL (ref 6–20)
Bilirubin Total: <0.2 mg/dL (ref 0.0–1.2)
CO2: 25 mmol/L (ref 20–29)
Calcium: 9.2 mg/dL (ref 8.7–10.2)
Chloride: 102 mmol/L (ref 96–106)
Creatinine, Ser: 0.69 mg/dL (ref 0.57–1.00)
GFR calc Af Amer: 132 mL/min/{1.73_m2} (ref >59)
GFR calc non Af Amer: 115 mL/min/{1.73_m2} (ref >59)
Globulin, Total: 2.3 g/dL (ref 1.5–4.5)
Glucose: 76 mg/dL (ref 65–99)
Potassium: 4.5 mmol/L (ref 3.5–5.2)
Sodium: 141 mmol/L (ref 134–144)
Total Protein: 6.8 g/dL (ref 6.0–8.5)

## 2019-11-03 LAB — HEMOGLOBIN A1C
Est. average glucose Bld gHb Est-mCnc: 131 mg/dL
Hgb A1c MFr Bld: 6.2 % — ABNORMAL HIGH (ref 4.8–5.6)

## 2019-11-03 LAB — CBC
Hematocrit: 41.4 % (ref 34.0–46.6)
Hemoglobin: 13 g/dL (ref 11.1–15.9)
MCH: 26.9 pg (ref 26.6–33.0)
MCHC: 31.4 g/dL — ABNORMAL LOW (ref 31.5–35.7)
MCV: 86 fL (ref 79–97)
Platelets: 281 10*3/uL (ref 150–450)
RBC: 4.84 x10E6/uL (ref 3.77–5.28)
RDW: 13.2 % (ref 11.7–15.4)
WBC: 8.2 10*3/uL (ref 3.4–10.8)

## 2019-11-03 LAB — TSH: TSH: 2.45 u[IU]/mL (ref 0.450–4.500)

## 2019-11-03 LAB — TESTOSTERONE, TOTAL, LC/MS/MS: Testosterone, total: 29.7 ng/dL (ref 10.0–55.0)

## 2019-11-03 LAB — IRON,TIBC AND FERRITIN PANEL
Ferritin: 60 ng/mL (ref 15–150)
Iron Saturation: 12 % — ABNORMAL LOW (ref 15–55)
Iron: 40 ug/dL (ref 27–159)
Total Iron Binding Capacity: 333 ug/dL (ref 250–450)
UIBC: 293 ug/dL (ref 131–425)

## 2019-11-03 MED ORDER — BACLOFEN 10 MG PO TABS: 5.0000 mg | ORAL_TABLET | Freq: Two times a day (BID) | ORAL | 2 refills | Status: DC | PRN

## 2019-11-03 MED ORDER — NURTEC 75 MG PO TBDP: 75.0000 mg | ORAL_TABLET | Freq: Every day | ORAL | 0 refills | Status: DC

## 2019-11-03 MED ORDER — TIZANIDINE HCL 4 MG PO TABS: 4.0000 mg | ORAL_TABLET | Freq: Four times a day (QID) | ORAL | 6 refills | Status: DC | PRN

## 2019-11-03 MED ORDER — RIZATRIPTAN BENZOATE 10 MG PO TBDP: ORAL_TABLET | ORAL | 4 refills | Status: DC

## 2019-11-03 MED ORDER — ONDANSETRON 4 MG PO TBDP
4.0000 mg | ORAL_TABLET | Freq: Four times a day (QID) | ORAL | 3 refills | Status: DC | PRN
  Filled 2020-10-12: qty 30, 8d supply, fill #0

## 2019-11-03 MED FILL — ONDANSETRON ODT 4 MG TABLET: 4 | 7 days supply | Qty: 30 | Fill #0

## 2019-11-03 MED FILL — tiZANidine HCL 4 MG TABS: 4 | 15 days supply | Qty: 60 | Fill #0

## 2019-11-03 MED FILL — BACLOFEN 10 MG TABS: 10 | 15 days supply | Qty: 15 | Fill #0

## 2019-11-03 NOTE — Patient Instructions (Addendum)
Your Plan:  Continue current treatment plan but try Nurtec 75mg  daily as needed for emergent relief in place of Ubrelvy   Referral placed to physical therapy at [facility] for dry needling   Refills provided     Return in 1 year or call earlier if needed     Thank you for coming to see [place] at [facility]. I hope we have been able to provide you high quality care today.  You may receive a patient satisfaction survey over the next few weeks. We would appreciate your feedback and comments so that we may continue to improve ourselves and the health of our patients.

## 2019-11-03 NOTE — Progress Notes (Addendum)
[facility]    Provider:  Dr Jaynee Eagles Requesting Provider: Tisovec, Fransico Him, MD Primary Care Provider:  Haywood Pao, MD  CC:  Migraines    HPI: Today, 11/03/2019, [name] returns for migraine follow-up.  Current migraine treatment plan includes  Prevention: Aimovig 140 mg SQ monthly, Emergent as needed: ubrelvy 50 mg as needed and baclofen; if severe unable to sleep, rizatriptan 10 mg and tizanidine 4 mg.  Zofran 4 mg as needed for nausea  Reports severe migraine in 05/2019 with typical migraine onset and location but accompanied by slurred speech, right arm heaviness and unsteadiness.  She has not previously experienced the symptoms.  Pain rated 10/10.  She ended up falling asleep after taking rizatriptan and tizanidine and after awakening 4 hours later, symptoms resolved including migraine but did continue to feel spaced out sensation which is typical for her.  She has not experienced recurrent neurological symptoms since that time.  During the month of March and April, she was experiencing migraines 3-4 times weekly lasting 4 to 6 hours without benefit of emergent medications.  She does admit to missing Aimovig injections in January and February due to home stressors.  Restarted monthly Aimovig in March and migraines have greatly improved since that time.  She does endorse ongoing bilateral neck pain and feels as though this contributes to migraine onset.  She has trialed massage in the past without great benefit.     History provided for reference purposes only Interval history 11/24/2018 Dr. Jaynee Eagles:  Follow up for migraines and new symptom hand pain.  Patient reports for several years she has had hand pain numbness and tingling mostly in digits 1 through 3 more so on the left but also on the right.  She wakes up in the middle of the night and has to shake her hands out.  She had an MRI of the cervical spine in 2017 due to the symptoms but did not show  etiology.  It is worsening.  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  Positive Phalen's sign.  I discussed carpal tunnel syndrome with patient and we will have her in for an EMG nerve conduction study.  She is doing amazing on Aimovig she loves it continue.  Personally reviewed MRI of th cervical spine and agree with the following 2017:   IMPRESSION: Negative for central canal stenosis. Uncovertebral spurring results in some foraminal narrowing on the left appearing most notable at C3-4 with a very mild degree of foraminal narrowing on the left at C4-5 and C5-6.   Initial consult visit 10/07/2018 Dr. Jaynee Eagles:  Abigail Frank is a 34 y.o. female here as requested by Tisovec, Fransico Him, MD for migraines. PMHx migraine. No family history that she knows of, her mother is deceased. Struggling for years. Started during her menses as a teenager and have worsened over the years. Worse with life stressors. At least 2-3 migraines. She has 25 headache days a month. At least 12 migraine days a month that can be moderately severe or severe. Pounding/pulsating, interrupts her daily life, she has to lay down, turn off the light, nausea, no vomiting, +photo/phonophbia, Can last 24-72 hours. No medication overuse. No aura. Ongoing for over a year. Migraines starts as tension in the neck and creep up the back of her neck, pins and needles, to the right and then all over, pain behind the right eye, right side is the worse side. They can be positional. She wakes  with them in the morning. Her left arm goes numb, tried a steroid pack. She follows with an orthopaedist for her neck. Positions of her head can cause the headaches. Neck pain and tightness. No other focal neurologic deficits, associated symptoms, inciting events or modifiable factors.  Meds tried: zoloft. Topamax(made her "crazy"), ubrelvy, baclofen, imitrex..   Review of Systems: Patient complains of symptoms per HPI as well as the following symptoms:  Headaches.  Pertinent negatives and positives per HPI. All others negative.   Social History   Socioeconomic History  . Marital status: Married    Spouse name: Not on file  . Number of children: 2  . Years of education: Not on file  . Highest education level: Bachelor's degree (e.g., BA, AB, BS)  Occupational History  . Not on file  Tobacco Use  . Smoking status: Never Smoker  . Smokeless tobacco: Never Used  Substance and Sexual Activity  . Alcohol use: Yes    Comment: on occasion a glass of wine  . Drug use: Never  . Sexual activity: Yes    Birth control/protection: None  Other Topics Concern  . Not on file  Social History Narrative   Lives at home with her family   Left handed   Caffeine: regular, at least 2 cups of coffee daily   Social Determinants of Health   Financial Resource Strain:   . Difficulty of Paying Living Expenses:   Food Insecurity:   . Worried About Charity fundraiser in the Last Year:   . Arboriculturist in the Last Year:   Transportation Needs:   . Film/video editor (Medical):   Marland Kitchen Lack of Transportation (Non-Medical):   Physical Activity:   . Days of Exercise per Week:   . Minutes of Exercise per Session:   Stress:   . Feeling of Stress :   Social Connections:   . Frequency of Communication with Friends and Family:   . Frequency of Social Gatherings with Friends and Family:   . Attends Religious Services:   . Active Member of Clubs or Organizations:   . Attends Archivist Meetings:   Marland Kitchen Marital Status:   Intimate Partner Violence:   . Fear of Current or Ex-Partner:   . Emotionally Abused:   Marland Kitchen Physically Abused:   . Sexually Abused:     Family History  Problem Relation Age of Onset  . Early death Maternal Grandfather 45       ?pulmonary disease  . Pulmonary fibrosis Maternal Grandfather   . Hypertension Mother   . Hypothyroidism Mother   . Heart attack Mother   . Diabetes Mother   . Heart disease Mother         heart failure  . Dementia Mother   . Kidney disease Mother   . Hypertension Father   . Hypothyroidism Father   . Diabetes Father   . Obesity Father   . Cancer Maternal Grandmother        colon  . Dementia Maternal Grandmother   . Hypertension Maternal Grandmother   . Seizures Child        thinks its genetic, only seizes if she hits her head,thinks its migraine related   . Migraines Neg Hx     Past Medical History:  Diagnosis Date  . Anxiety   . Asthma   . Bone spur    cervical L side; L side arm numbness intermittent   . Depression   . Diabetes in  pregnancy   . Elevated cholesterol with high triglycerides   . Gestational diabetes   . Hx of varicella   . IBS (irritable bowel syndrome)   . Kidney stones    followed by Dr. Tammi Klippel  . Migraines    without aura  . Ovarian cyst   . Postpartum care following vaginal delivery (1/30) 07/15/2013  . Preterm uterine contractions 06/10/2013    Patient Active Problem List   Diagnosis Date Noted  . Carpal tunnel syndrome of left wrist 12/26/2018  . Chronic migraine without aura without status migrainosus, not intractable 10/09/2018  . Cough variant asthma with ? component UACS 07/14/2017  . Gestational diabetes mellitus, class A2 07/15/2013  . Postpartum care following vaginal delivery (1/30) 07/15/2013  . Preterm uterine contractions 06/10/2013    Past Surgical History:  Procedure Laterality Date  . MOUTH SURGERY     wisdom teeth extractions    Current Outpatient Medications  Medication Sig Dispense Refill  . AIMOVIG 140 MG/ML SOAJ INJECT 140 MG INTO THE SKIN EVERY 30 DAYS. 1 mL 11  . albuterol (PROVENTIL HFA;VENTOLIN HFA) 108 (90 BASE) MCG/ACT inhaler Inhale 2 puffs into the lungs every 6 (six) hours as needed for wheezing or shortness of breath.    . baclofen (LIORESAL) 10 MG tablet Take 5 mg by mouth as needed.   2  . fluticasone (FLOVENT HFA) 110 MCG/ACT inhaler Inhale 1 puff into the lungs 2 (two) times daily.    Marland Kitchen  gabapentin (NEURONTIN) 300 MG capsule Take 1 capsule (300 mg total) by mouth at bedtime as needed. For numbness and tingling in the hands. 30 capsule 11  . indapamide (LOZOL) 1.25 MG tablet Take 1.25 mg by mouth daily.    Marland Kitchen lidocaine (XYLOCAINE) 5 % ointment Apply small amount to skin as directed no more than three times daily. 30 g 0  . metoCLOPramide (REGLAN) 10 MG tablet Take three times a day while having migraine or nausea. 90 tablet 1  . norethindrone-ethinyl estradiol (LOESTRIN FE) 1-20 MG-MCG tablet Take 1 tablet by mouth daily. Takes continuous active pills, skipping placebo pills 4 Package 1  . ondansetron (ZOFRAN-ODT) 4 MG disintegrating tablet Take 1 tablet (4 mg total) by mouth every 6 (six) hours as needed for nausea. Or for headache. 30 tablet 3  . rizatriptan (MAXALT-MLT) 10 MG disintegrating tablet DISSOLVE 1 TABLET BY MOUTH AS NEEDED FOR MIGRAINE. MAY REPEAT IN 2 HOURS IF NEEDED 9 tablet 0  . sertraline (ZOLOFT) 100 MG tablet Take 100 mg by mouth at bedtime.     Marland Kitchen tiZANidine (ZANAFLEX) 4 MG tablet Take 1 tablet (4 mg total) by mouth every 6 (six) hours as needed. For migraines/headaches or muscle pain. 60 tablet 6  . Ubrogepant (UBRELVY) 50 MG TABS Take 50 mg by mouth as needed.     No current facility-administered medications for this visit.    Allergies as of 11/03/2019 - Review Complete 11/03/2019  Allergen Reaction Noted  . Bactrim [sulfamethoxazole-trimethoprim] Hives 11/06/2015  . Sulfa antibiotics  10/31/2019  . Vicodin [hydrocodone-acetaminophen] Nausea And Vomiting 04/07/2013    Vitals: Today's Vitals   11/03/19 1417  BP: 127/74  Pulse: 90  Weight: 204 lb (92.5 kg)  Height: 5\' 2"  (1.575 m)   Body mass index is 37.31 kg/m.   Physical exam: General: well developed, well nourished, very pleasant middle-age Caucasian female, seated, in no evident distress Head: head normocephalic and atraumatic.   Neck: supple with no carotid or supraclavicular  bruits Cardiovascular: regular  rate and rhythm, no murmurs Musculoskeletal: no deformity Skin:  no rash/petichiae Vascular:  Normal pulses all extremities   Neurologic Exam Mental Status: Awake and fully alert. Oriented to place and time. Recent and remote memory intact. Attention span, concentration and fund of knowledge appropriate. Mood and affect appropriate.  Cranial Nerves: Pupils equal, briskly reactive to light. Extraocular movements full without nystagmus. Visual fields full to confrontation. Hearing intact. Facial sensation intact. Face, tongue, palate moves normally and symmetrically.  Motor: Normal bulk and tone. Normal strength in all tested extremity muscles. Sensory.: intact to touch , pinprick , position and vibratory sensation.  Coordination: Rapid alternating movements normal in all extremities. Finger-to-nose and heel-to-shin performed accurately bilaterally. Gait and Station: Arises from chair without difficulty. Stance is normal. Gait demonstrates normal stride length and balance Reflexes: 1+ and symmetric. Toes downgoing.      Assessment/Plan:  34 year old with chronic migraines with severe migraine 05/2019 accompanied by slurred speech, right arm heaviness and unsteadiness with symptoms shortly resolving.  Missed doses of Aimovig in January and February with increase headache days March and April.  Restarted Aimovig in March and over the past 2 weeks, has experienced 3 migraines.    Continue Aimovig 140 mg SQ monthly.  For emergent relief, continue Maxalt and tizanidine with severe onset and ability to sleep as both make her fatigued.  Reports Roselyn Meier occasionally works but takes longer time to take effect therefore recommend trialing Nurtec 75 mg daily at migraine onset as well as baclofen for mild migraines. Nurtec samples provided and will call office if beneficial for official prescription. Ongoing use of Zofran as needed for nausea.  Refills provided for all above  medications.  Ongoing neck pain contributing to migraine onset.  Referral placed to PT for dry needling for possible benefit.  Severe migraine with neurological symptoms likely due to migraine and low suspicion for stroke or TIA.  Advised to call office if she experiences recurrent symptoms or new neurological symptoms.   Follow-up in 1 year or earlier if needed   I spent 29 minutes of face-to-face and non-face-to-face time with patient.  This included previsit chart review, lab review, study review, order entry, electronic health record documentation, patient education    Frann Rider, [facility]  [facility] [address] [place], [place] [address]  Phone [phone] Fax [phone] Note: This document was prepared with digital dictation and possible smart phrase technology. Any transcriptional errors that result from this process are unintentional.  Made any corrections needed, and agree with history, physical, neuro exam,assessment and plan as stated.     Sarina Ill, MD [facility]

## 2019-11-11 ENCOUNTER — Telehealth: Payer: Self-pay | Admitting: *Deleted

## 2019-11-11 NOTE — Telephone Encounter (Signed)
Burnice Logan, RN  11/11/2019 10:10 AM EDT    Left message to call Sharee Pimple, RN at [place].

## 2019-11-11 NOTE — Telephone Encounter (Signed)
-----   Message from Megan Salon, MD sent at 11/11/2019  6:58 AM EDT ----- 02 pap recall.  Notified pt in person of results as she is an Therapist, sports at [employer]   1) She is aware of the pre-diabetes.  Please confirm with her when is her next appt with Dr. Osborne Casco.  Also confirm does she want me to send labs to him 2) she is aware CBC, TSH, CMP, Iron studies, testosterone is all ok.  She started on OCPs so I want to plan a recheck in 3 months. 3) She is having excessive sweating and this can be a side effect of Zoloft.  We discussed this and she is willing to make a change.  I've reviewed the SSRI class and I think we should switch to Celexa.  As she was on 50mg  of Zoloft, I would suggest starting with 10mg .  She can just switch and not cross titrate.  Ok to send in rx for #30/1RF.  I would like her to update me in 2-3 weeks especially if she thinks the dosage needs to go to 20mg .    CC:  Royal Hawthorn, [place]

## 2019-11-16 ENCOUNTER — Ambulatory Visit: Payer: 59 | Attending: Family Medicine | Admitting: Physical Therapy

## 2019-11-16 ENCOUNTER — Other Ambulatory Visit: Payer: Self-pay

## 2019-11-16 ENCOUNTER — Encounter: Payer: Self-pay | Admitting: Physical Therapy

## 2019-11-16 DIAGNOSIS — M62838 Other muscle spasm: Secondary | ICD-10-CM | POA: Diagnosis not present

## 2019-11-16 DIAGNOSIS — M542 Cervicalgia: Secondary | ICD-10-CM | POA: Insufficient documentation

## 2019-11-16 DIAGNOSIS — R293 Abnormal posture: Secondary | ICD-10-CM | POA: Insufficient documentation

## 2019-11-16 NOTE — Therapy (Signed)
[facility] [address]  [place], [place], [address] Phone: [phone]   Fax:  [phone]  Physical Therapy Evaluation  Patient Details  Name: Abigail Frank MRN: [id number] Date of Birth: 1985/11/28 Referring Provider (PT): Frann Rider, NP   Encounter Date: 11/16/2019  PT End of Session - 11/16/19 1323    Visit Number  1    Number of Visits  13    Date for PT Re-Evaluation  12/28/19    Authorization Type  [facility]    PT Start Time  1019    PT Stop Time  1100    PT Time Calculation (min)  41 min    Activity Tolerance  Patient tolerated treatment well    Behavior During Therapy  [facility] for tasks assessed/performed       Past Medical History:  Diagnosis Date  . Anxiety   . Asthma   . Bone spur    cervical L side; L side arm numbness intermittent   . Depression   . Diabetes in pregnancy   . Elevated cholesterol with high triglycerides   . Gestational diabetes   . Hx of varicella   . IBS (irritable bowel syndrome)   . Kidney stones    followed by Dr. Tammi Klippel  . Migraines    without aura  . Ovarian cyst   . Postpartum care following vaginal delivery (1/30) 07/15/2013  . Preterm uterine contractions 06/10/2013    Past Surgical History:  Procedure Laterality Date  . MOUTH SURGERY     wisdom teeth extractions    There were no vitals filed for this visit.   Subjective Assessment - 11/16/19 1021    Subjective  Patient reports migraines and neck pain/stiffness ongoing nearly for the last 10 years. Dr. Jaynee Eagles recommended that she try PT and DN. Migraines start with a tension HA- begin at the suboccipitals and wrap around to the top of the head. Migraines are accompanied by photo/phonophobia but no nausea/vomiting. Migraines occur 3x/month and tension HAs 4-5x/week.  Migraines are worse around her menstrual cycle and HAs worse in the evening. Has N/T in B hands into all 5 fingers- does report a hx of  carpal tunnel syndrome in L wrist. Goes to a massage therapist every 6 weeks which helps with muscle tension. Also notes a hx of sciatica causing pain down the L LE, but would like to focus on her neck.    Pertinent History  migraines, kidney stones, HLD, depression, asthma, anxiety    Limitations  Reading;House hold activities;Writing;Lifting    Diagnostic tests  01/04/19 cervical MRI: Mild degenerative changes at C3-C4 and C4-C5 more towards the left.  There is moderate foraminal narrowing at C3-C4 and mild left foraminal narrowing at C4-C5 but no nerve root compression.  There is milder uncovertebral spurring at C5-C6 causing minimal foraminal narrowing but no nerve root compression.    Patient Stated Goals  help with manage tension HAs    Currently in Pain?  Yes    Pain Score  2     Pain Location  Neck    Pain Orientation  Right;Left;Posterior    Pain Descriptors / Indicators  Sore    Pain Type  Chronic pain    Multiple Pain Sites  Yes    Pain Score  6    Pain Location  Coccyx    Pain Descriptors / Indicators  Discomfort    Pain Type  Acute pain  [facility] PT Assessment - 11/16/19 1029      Assessment   Medical Diagnosis  Neck stiffness, muscle stiffness    Referring Provider (PT)  Frann Rider, NP    Onset Date/Surgical Date  --   nearly a decade   Hand Dominance  Left    Next MD Visit  11/05/20    Prior Therapy  yes      Precautions   Precautions  None      Balance Screen   Has the patient fallen in the past 6 months  No    Has the patient had a decrease in activity level because of a fear of falling?   No      Home Environment   Living Environment  Private residence    Living Arrangements  Children;Spouse/significant other   [place] for father and grandmother, 2 kids   Available Help at Discharge  Family;Friend(s)    Type of Home  House      Prior Function   Level of Independence  Independent    Vocation  Part time employment    Occupational psychologist  at [employer]  spend time with kids      Cognition   Overall Cognitive Status  Within Functional Limits for tasks assessed      Sensation   Light Touch  Appears Intact      Coordination   Gross Motor Movements are Fluid and Coordinated  Yes      Posture/Postural Control   Posture/Postural Control  Postural limitations    Postural Limitations  Rounded Shoulders    Posture Comments  slight Dowager's hump evident       ROM / Strength   AROM / PROM / Strength  AROM;Strength      AROM   AROM Assessment Site  Cervical    Cervical Flexion  60    Cervical Extension  60   moderate pain at base of head   Cervical - Right Side Bend  52    Cervical - Left Side Bend  43    Cervical - Right Rotation  64    Cervical - Left Rotation  70      Strength   Strength Assessment Site  Shoulder;Elbow;Wrist;Hand    Right/Left Shoulder  Right;Left    Right Shoulder Flexion  4+/5    Right Shoulder ABduction  4+/5    Right Shoulder Internal Rotation  4+/5    Right Shoulder External Rotation  4+/5    Left Shoulder Flexion  4+/5    Left Shoulder ABduction  4+/5    Left Shoulder Internal Rotation  4/5    Left Shoulder External Rotation  4+/5    Right/Left Elbow  Right;Left    Right Elbow Flexion  5/5    Right Elbow Extension  5/5    Left Elbow Flexion  5/5    Left Elbow Extension  5/5    Right/Left Wrist  Right;Left    Right Wrist Flexion  4+/5    Right Wrist Extension  4+/5    Left Wrist Flexion  4+/5    Left Wrist Extension  4+/5    Right/Left hand  Right;Left    Right Hand Grip (lbs)  23.33   25, 19, 26   Left Hand Grip (lbs)  28.33   30, 30, 25     Palpation   Spinal mobility  tenderness and hypomobility throughout cervical and upper thoracic vertebrae with central PAs  Palpation comment  considerable tone throughout cervical and upper thoracic musculature with most tenderness over B LS, rhomboids, thoracic paraspinals, scalenes, and pecs L>R                   Objective measurements completed on examination: See above findings.              PT Education - 11/16/19 1323    Education Details  prognosis, POC, HEP    Person(s) Educated  Patient    Methods  Explanation;Demonstration;Tactile cues;Verbal cues;Handout    Comprehension  Verbalized understanding;Returned demonstration       PT Short Term Goals - 11/16/19 1334      PT SHORT TERM GOAL #1   Title  Patient to be independent with initial HEP.    Time  3    Period  Weeks    Status  New    Target Date  12/07/19        PT Long Term Goals - 11/16/19 1335      PT LONG TERM GOAL #1   Title  Patient to be independent with advanced HEP.    Time  6    Period  Weeks    Status  New    Target Date  12/28/19      PT LONG TERM GOAL #2   Title  Patient to recall/demonstrate self-correction of posture at rest and with activity for improvement in postural awareness.    Time  6    Period  Weeks    Status  New    Target Date  12/28/19      PT LONG TERM GOAL #3   Title  Patient to report 75% reduction in frequency and intensity of weekly HAs.    Time  6    Period  Weeks    Status  New    Target Date  12/28/19      PT LONG TERM GOAL #4   Title  Patient to report 50% improvement in ability to interact with her kids d/t improvement in HAs and neck pain.    Time  6    Period  Weeks    Status  New    Target Date  12/28/19             Plan - 11/16/19 1324    Clinical Impression Statement  Patient is a 33y/o F presenting to OPPT with c/o chronic tension HAs and neck stiffness for nearly the past 10 years. Pain begins in the suboccipitals and wraps around to the top of the head in a ram's horn pattern. Migraines are accompanied by photo/phonophobia. Migraines occur 3x/month and tension HAs 4-5x/week. Does endorse N/T in B hands into all 5 fingers, but with a hx of CTS in L wrist. 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 Patient today presented with rounded shoulders and slight forward head posture, WFL cervical AROM but painful with extension, good UE strength, tenderness and hypomobility throughout cervical and upper thoracic vertebrae with central PAs, and considerable tone throughout cervical and upper thoracic musculature with most tenderness over B LS, rhomboids, thoracic paraspinals, scalenes, and pecs L>R. Patient was educated on gentle postural correction HEP and reported understanding. Would benefit from skilled PT services 2x/week for 6 weeks to address aforementioned impairments.    Personal Factors and Comorbidities  Age;Sex;Comorbidity 3+;Fitness;Past/Current Experience;Profession;Time since onset of injury/illness/exacerbation  Comorbidities  migraines, kidney stones, HLD, depression, asthma, anxiety    Examination-Activity Limitations  Sleep;Caring for Others;Carry;Stand;Dressing;Lift;Reach Overhead    Examination-Participation Restrictions  Church;Cleaning;Shop;Community Activity;Driving;Yard Work;Interpersonal Relationship;Laundry;Meal Prep    Stability/Clinical Decision Making  Stable/Uncomplicated    Clinical Decision Making  Low    Rehab Potential  Good    PT Frequency  2x / week    PT Duration  6 weeks    PT Treatment/Interventions  ADLs/Self Care Home Management;Cryotherapy;Electrical Stimulation;Moist Heat;Traction;Therapeutic exercise;Therapeutic activities;Functional mobility training;Ultrasound;Neuromuscular re-education;Patient/family education;Manual techniques;Taping;Energy conservation;Dry needling;Passive range of motion    PT Next Visit Plan  neck FOTO; reassess HEP; STM/IASTM, DN, postural correction ther-ex    Consulted and Agree with Plan of Care  Patient       Patient will benefit from skilled therapeutic intervention in order to improve the following deficits and impairments:  Hypomobility,  Decreased activity tolerance, Decreased strength, Pain, Impaired UE functional use, Increased fascial restricitons, Increased muscle spasms, Improper body mechanics, Postural dysfunction, Impaired flexibility, Decreased range of motion  Visit Diagnosis: Cervicalgia  Other muscle spasm  Abnormal posture     Problem List Patient Active Problem List   Diagnosis Date Noted  . Carpal tunnel syndrome of left wrist 12/26/2018  . Chronic migraine without aura without status migrainosus, not intractable 10/09/2018  . Cough variant asthma with ? component UACS 07/14/2017  . Gestational diabetes mellitus, class A2 07/15/2013  . Postpartum care following vaginal delivery (1/30) 07/15/2013  . Preterm uterine contractions 06/10/2013    Janene Harvey, PT, DPT 11/16/19 1:40 PM   [facility] [address]  [place], [place], [address] Phone: [phone]   Fax:  [phone]  Name: Abigail Frank MRN: [id number] Date of Birth: May 06, 1986

## 2019-11-17 DIAGNOSIS — M5416 Radiculopathy, lumbar region: Secondary | ICD-10-CM | POA: Diagnosis not present

## 2019-11-17 MED FILL — predniSONE 10 MG (21) TBPK: 10 | 6 days supply | Qty: 21 | Fill #0

## 2019-11-17 MED FILL — AIMOVIG 140 MG/ML SOAJ: 140 | 30 days supply | Qty: 1 | Fill #1

## 2019-11-21 NOTE — Telephone Encounter (Signed)
Patient is returning call. Patient stated she can be reached at her work number [phone].

## 2019-11-21 NOTE — Telephone Encounter (Signed)
Call placed to work number, not available, left message to return call to [place] at [facility].

## 2019-11-22 ENCOUNTER — Ambulatory Visit: Payer: 59

## 2019-11-22 ENCOUNTER — Other Ambulatory Visit: Payer: Self-pay

## 2019-11-22 DIAGNOSIS — R293 Abnormal posture: Secondary | ICD-10-CM

## 2019-11-22 DIAGNOSIS — M542 Cervicalgia: Secondary | ICD-10-CM

## 2019-11-22 DIAGNOSIS — M62838 Other muscle spasm: Secondary | ICD-10-CM | POA: Diagnosis not present

## 2019-11-22 NOTE — Therapy (Signed)
[facility] [address]  [place], [place], [address] Phone: [phone]   Fax:  [phone]  Physical Therapy Treatment  Patient Details  Name: Abigail Frank MRN: [id number] Date of Birth: [birth year] Referring Provider (PT): Frann Rider, NP   Encounter Date: 11/22/2019  PT End of Session - 11/22/19 1840    Visit Number  2    Number of Visits  13    Date for PT Re-Evaluation  12/28/19    Authorization Type  [facility]    PT Start Time  1618    PT Stop Time  1703    PT Time Calculation (min)  45 min    Activity Tolerance  Patient tolerated treatment well    Behavior During Therapy  [facility] for tasks assessed/performed       Past Medical History:  Diagnosis Date  . Anxiety   . Asthma   . Bone spur    cervical L side; L side arm numbness intermittent   . Depression   . Diabetes in pregnancy   . Elevated cholesterol with high triglycerides   . Gestational diabetes   . Hx of varicella   . IBS (irritable bowel syndrome)   . Kidney stones    followed by Dr. Tammi Klippel  . Migraines    without aura  . Ovarian cyst   . Postpartum care following vaginal delivery (1/30) 07/15/2013  . Preterm uterine contractions 06/10/2013    Past Surgical History:  Procedure Laterality Date  . MOUTH SURGERY     wisdom teeth extractions    There were no vitals filed for this visit.  Subjective Assessment - 11/22/19 1621    Subjective  Pt reports the base of her neck is tight. She has likely found a good place for her grandmother that should help some with her pain.    Pertinent History  migraines, kidney stones, HLD, depression, asthma, anxiety    Diagnostic tests  01/04/19 cervical MRI: Mild degenerative changes at C3-C4 and C4-C5 more towards the left.  There is moderate foraminal narrowing at C3-C4 and mild left foraminal narrowing at C4-C5 but no nerve root compression.  There is milder uncovertebral spurring at C5-C6  causing minimal foraminal narrowing but no nerve root compression.    Patient Stated Goals  help with manage tension HAs    Currently in Pain?  Yes    Pain Score  5     Pain Location  Neck    Pain Orientation  Left                        [facility] Adult PT Treatment/Exercise - 11/22/19 0001      Exercises   Exercises  Neck      Neck Exercises: Seated   Neck Retraction Limitations  + pt cervical traction 2 x 20"    Other Seated Exercise  T/S extension over chair 1 x 5 with [place]      Neck Exercises: Supine   Other Supine Exercise  FR SO self MFR, supine on FR vertically --> spiderman arcs x 20, scissors x 20, pec stretch 1 x 30"      Neck Exercises: Sidelying   Other Sidelying Exercise  B thoracic rotation with lumbar spine stabilized 1 x 15      Manual Therapy   Manual Therapy  Joint mobilization;Soft tissue mobilization;Manual Traction    Manual therapy comments  prone, supine    Joint  Mobilization  grade III L cervical side-glides, prone T/S CPAs grade III-IV    Soft tissue mobilization  B UT, SO, LS, L rhomboids and mid traps, C/S PS    Manual Traction  Prone traction inferior to C7-T1 and upper thoracic joints grade III-IV, supine cervical traction               PT Short Term Goals - 11/16/19 1334      PT SHORT TERM GOAL #1   Title  Patient to be independent with initial HEP.    Time  3    Period  Weeks    Status  New    Target Date  12/07/19        PT Long Term Goals - 11/16/19 1335      PT LONG TERM GOAL #1   Title  Patient to be independent with advanced HEP.    Time  6    Period  Weeks    Status  New    Target Date  12/28/19      PT LONG TERM GOAL #2   Title  Patient to recall/demonstrate self-correction of posture at rest and with activity for improvement in postural awareness.    Time  6    Period  Weeks    Status  New    Target Date  12/28/19      PT LONG TERM GOAL #3   Title  Patient to report 75% reduction in frequency and  intensity of weekly HAs.    Time  6    Period  Weeks    Status  New    Target Date  12/28/19      PT LONG TERM GOAL #4   Title  Patient to report 50% improvement in ability to interact with her kids d/t improvement in HAs and neck pain.    Time  6    Period  Weeks    Status  New    Target Date  12/28/19            Plan - 11/22/19 1840    Clinical Impression Statement  Pt presents with increased tension in L side of neck and upper back, per her normal. She has been doing her HEP, which does feel better. Pt has moderate forward head and decreased thoracic spinal mobility, likely contributing to tension. She responded well to manual therapy with increased tone and decreased mobility to L cervical and thoracic spine and soft tissue. She did very well with FR moblity exercises reporting relief at end of session and stated she will buy a foam roller. She will continue to benefit from progressions of mobility, spinal alignment, and postural endurance to reduce onset and frequency of HA.    Personal Factors and Comorbidities  Age;Sex;Comorbidity 3+;Fitness;Past/Current Experience;Profession;Time since onset of injury/illness/exacerbation    Comorbidities  migraines, kidney stones, HLD, depression, asthma, anxiety    Examination-Activity Limitations  Sleep;Caring for Others;Carry;Stand;Dressing;Lift;Reach Overhead    Examination-Participation Restrictions  Church;Cleaning;Shop;Community Activity;Driving;Yard Work;Interpersonal Relationship;Laundry;Meal Prep    Stability/Clinical Decision Making  Stable/Uncomplicated    Rehab Potential  Good    PT Frequency  2x / week    PT Duration  6 weeks    PT Treatment/Interventions  ADLs/Self Care Home Management;Cryotherapy;Electrical Stimulation;Moist Heat;Traction;Therapeutic exercise;Therapeutic activities;Functional mobility training;Ultrasound;Neuromuscular re-education;Patient/family education;Manual techniques;Taping;Energy conservation;Dry  needling;Passive range of motion    PT Next Visit Plan  neck FOTO; STM/IASTM, DN, postural correction ther-ex    Consulted and Agree with Plan of Care  Patient  Patient will benefit from skilled therapeutic intervention in order to improve the following deficits and impairments:  Hypomobility, Decreased activity tolerance, Decreased strength, Pain, Impaired UE functional use, Increased fascial restricitons, Increased muscle spasms, Improper body mechanics, Postural dysfunction, Impaired flexibility, Decreased range of motion  Visit Diagnosis: Cervicalgia  Abnormal posture  Other muscle spasm     Problem List Patient Active Problem List   Diagnosis Date Noted  . Carpal tunnel syndrome of left wrist 12/26/2018  . Chronic migraine without aura without status migrainosus, not intractable 10/09/2018  . Cough variant asthma with ? component UACS 07/14/2017  . Gestational diabetes mellitus, class A2 07/15/2013  . Postpartum care following vaginal delivery (1/30) 07/15/2013  . Preterm uterine contractions 06/10/2013    Izell [place], PT, DPT 11/22/2019, 6:44 PM  [facility] [address]  [place], [place], [address] Phone: [phone]   Fax:  [phone]  Name: Abigail Frank MRN: [id number] Date of Birth: 04/16/1986

## 2019-11-22 NOTE — Telephone Encounter (Signed)
Left message to call Kinlee Garrison, RN at [facility] [phone].   

## 2019-11-30 ENCOUNTER — Other Ambulatory Visit: Payer: Self-pay | Admitting: Neurology

## 2019-11-30 MED FILL — SERTRALINE HCL 100 MG TABS: 100 | 90 days supply | Qty: 90 | Fill #1

## 2019-11-30 MED FILL — GABAPENTIN 300 MG CAPSULE: 300 | 30 days supply | Qty: 30 | Fill #0

## 2019-11-30 NOTE — Telephone Encounter (Signed)
Left message to call Alicja Everitt, RN at [facility] [phone].   

## 2019-12-01 NOTE — Telephone Encounter (Signed)
MyChart message to patient requesting return call.

## 2019-12-01 NOTE — Telephone Encounter (Signed)
Spoke with patient, advised of all results and recommendations as seen below per Dr. Sabra Heck. Next AEX 12/31/20.   1. Patient will see Dr. Osborne Casco the end of July. Request copy of 10/31/19 labs to PCP, labs faxed via Epic.   2. OV scheduled for recheck 02/27/20 at 4pm. She started OCP this week.   3. Patient agreeable to switching to Celexa. Patient reports she is taking Zoloft 100 mg daily. Advised I will confirm dosage on Celexa and return call if any changes. Confirmed pharmacy on file. Patient verbalizes understanding and is agreeable.   Rx pended for Celexa.   Dr. Sabra Heck -patient is taking Zoloft 100 mg daily, will this change anything with the recommended starting dose for Celexa?

## 2019-12-01 NOTE — Telephone Encounter (Signed)
I would recommend 20mg  of Celexa.  She can just switch from Zoloft to Celexa without weaning off the zoloft as they are both in the SSRI medication class.  Thanks.

## 2019-12-02 MED ORDER — CITALOPRAM HYDROBROMIDE 20 MG PO TABS
20.0000 mg | ORAL_TABLET | Freq: Every day | ORAL | 1 refills | Status: DC
Start: 2019-12-02 — End: 2020-03-13

## 2019-12-02 MED FILL — CITALOPRAM HBR 20 MG TABLET: 20 | 30 days supply | Qty: 30 | Fill #0

## 2019-12-02 NOTE — Telephone Encounter (Signed)
Call to patient, left detailed message, ok per dpr. Advised as seen below per Dr. Sabra Heck, RX has been sent to [facility]. Advised of potential risk of GI bleed when taking SSRI and ibuprofen. Return call to office if any additional questions/concerns.    Drug-Drug: ibuprofen and citalopram Toxic effects may be increased with concurrent administration of ibuprofen and Selective Serotonin Reuptake Inhibitors. The risk of upper gastrointestinal bleeding may be increased. Patients taking both drugs concurrently should be educated about the signs and symptoms of GI bleeding.   Routing to provider for final review. Patient is agreeable to disposition. Will close encounter.

## 2019-12-05 ENCOUNTER — Ambulatory Visit: Payer: 59 | Admitting: Physical Therapy

## 2019-12-05 ENCOUNTER — Encounter: Payer: Self-pay | Admitting: Physical Therapy

## 2019-12-05 ENCOUNTER — Other Ambulatory Visit: Payer: Self-pay

## 2019-12-05 DIAGNOSIS — R293 Abnormal posture: Secondary | ICD-10-CM | POA: Diagnosis not present

## 2019-12-05 DIAGNOSIS — M542 Cervicalgia: Secondary | ICD-10-CM

## 2019-12-05 DIAGNOSIS — M62838 Other muscle spasm: Secondary | ICD-10-CM

## 2019-12-05 NOTE — Therapy (Signed)
[name] [address]  [place], [place], [address] Phone: [phone]   Fax:  [phone]  Physical Therapy Treatment  Patient Details  Name: Abigail Frank MRN: [id number] Date of Birth: Dec 08, 1985 Referring Provider (PT): Frann Rider, NP   Encounter Date: 12/05/2019   PT End of Session - 12/05/19 1448    Visit Number 3    Number of Visits 13    Date for PT Re-Evaluation 12/28/19    Authorization Type [facility]    PT Start Time 1448    PT Stop Time 1535    PT Time Calculation (min) 47 min    Activity Tolerance Patient tolerated treatment well    Behavior During Therapy [facility] for tasks assessed/performed           Past Medical History:  Diagnosis Date  . Anxiety   . Asthma   . Bone spur    cervical L side; L side arm numbness intermittent   . Depression   . Diabetes in pregnancy   . Elevated cholesterol with high triglycerides   . Gestational diabetes   . Hx of varicella   . IBS (irritable bowel syndrome)   . Kidney stones    followed by Dr. Tammi Klippel  . Migraines    without aura  . Ovarian cyst   . Postpartum care following vaginal delivery (1/30) 07/15/2013  . Preterm uterine contractions 06/10/2013    Past Surgical History:  Procedure Laterality Date  . MOUTH SURGERY     wisdom teeth extractions    There were no vitals filed for this visit.   Subjective Assessment - 12/05/19 1452    Subjective Pt reports more stiffness and discomfort than pain today. No migraine today, but states when tension builds it will lead to a migraine. Feels HEP has been helping.    Pertinent History migraines, kidney stones, HLD, depression, asthma, anxiety    Diagnostic tests 01/04/19 cervical MRI: Mild degenerative changes at C3-C4 and C4-C5 more towards the left.  There is moderate foraminal narrowing at C3-C4 and mild left foraminal narrowing at C4-C5 but no nerve root compression.  There is milder  uncovertebral spurring at C5-C6 causing minimal foraminal narrowing but no nerve root compression.    Patient Stated Goals help with manage tension HAs    Currently in Pain? Yes    Pain Score 2    1-2/10   Pain Location Neck    Pain Orientation Left    Pain Descriptors / Indicators Discomfort   "stiffness"   Pain Type Chronic pain    Pain Frequency Intermittent              [facility] PT Assessment - 12/05/19 1448      Observation/Other Assessments   Focus on Therapeutic Outcomes (FOTO)  Neck - 56% (45% limitation); Predicted 66% (35% limitation)                         [facility] Adult PT Treatment/Exercise - 12/05/19 1448      Exercises   Exercises Neck      Neck Exercises: Machines for Strengthening   UBE (Upper Arm Bike) L2.0 x 6 min (3' fwd/3' back)      Manual Therapy   Manual Therapy Soft tissue mobilization;Myofascial release    Manual therapy comments prone    Soft tissue mobilization B UT, LS, rhomboids and mid traps, C/S paraspinals    Myofascial Release manual TPR  and pin & stretch to B UT, LS and rhomboids            Trigger Point Dry Needling - 12/05/19 1448    Consent Given? Yes    Education Handout Provided Yes    Muscles Treated Head and Neck Upper trapezius;Suboccipitals   Bilateral   Muscles Treated Upper Quadrant Rhomboids   Bilateral   Upper Trapezius Response Twitch reponse elicited;Palpable increased muscle length    Suboccipitals Response Twitch response elicited;Palpable increased muscle length    Rhomboids Response Twitch response elicited;Palpable increased muscle length                PT Education - 12/05/19 1457    Education Details Role of DN, precautions and contraindications including precautions for DN over the lung fields, expected response to treatment and recommended post-treatment activity    Person(s) Educated Patient    Methods Explanation;Demonstration;Handout    Comprehension Verbalized understanding              PT Short Term Goals - 12/05/19 1535      PT SHORT TERM GOAL #1   Title Patient to be independent with initial HEP.    Time 3    Period Weeks    Status On-going    Target Date 12/07/19             PT Long Term Goals - 12/05/19 1535      PT LONG TERM GOAL #1   Title Patient to be independent with advanced HEP.    Time 6    Period Weeks    Status On-going    Target Date 12/28/19      PT LONG TERM GOAL #2   Title Patient to recall/demonstrate self-correction of posture at rest and with activity for improvement in postural awareness.    Time 6    Period Weeks    Status On-going    Target Date 12/28/19      PT LONG TERM GOAL #3   Title Patient to report 75% reduction in frequency and intensity of weekly HAs.    Time 6    Period Weeks    Status On-going    Target Date 12/28/19      PT LONG TERM GOAL #4   Title Patient to report 50% improvement in ability to interact with her kids d/t improvement in HAs and neck pain.    Time 6    Period Weeks    Status On-going    Target Date 12/28/19                 Plan - 12/05/19 1535    Clinical Impression Statement Marijayne reports good initial relief from HEP with decreasing pain today and no migraine/tension headache. She expressed interested in trying DN as this was recommended to her by Dr. Jaynee Eagles, therefore proceeded with manual therapy incorporating DN upon informed pt consent to areas of greatest tension in B UT, LS and rhomboids with additional STM to cervical and thoracic paraspinals - good twitch response elicited L>R in UT and R>L in LS and rhomboids with palpable reduction in muscle tension t/o all muscle treated today. Pt encouraged to keep up with HEP and will plan to progress postural strengthening on next visit.    Personal Factors and Comorbidities Age;Sex;Comorbidity 3+;Fitness;Past/Current Experience;Profession;Time since onset of injury/illness/exacerbation    Comorbidities migraines, kidney stones,  HLD, depression, asthma, anxiety    Examination-Activity Limitations Sleep;Caring for [employer]    Examination-Participation Restrictions Church;Cleaning;Shop;Community  Activity;Driving;Yard Work;Interpersonal Relationship;Laundry;Meal Prep    Rehab Potential Good    PT Frequency 2x / week    PT Duration 6 weeks    PT Treatment/Interventions ADLs/Self Care Home Management;Cryotherapy;Electrical Stimulation;Moist Heat;Traction;Therapeutic exercise;Therapeutic activities;Functional mobility training;Ultrasound;Neuromuscular re-education;Patient/family education;Manual techniques;Taping;Energy conservation;Dry needling;Passive range of motion;Spinal Manipulations    PT Next Visit Plan STM/IASTM, DN, postural correction ther-ex    Consulted and Agree with Plan of Care Patient           Patient will benefit from skilled therapeutic intervention in order to improve the following deficits and impairments:  Hypomobility, Decreased activity tolerance, Decreased strength, Pain, Impaired UE functional use, Increased fascial restricitons, Increased muscle spasms, Improper body mechanics, Postural dysfunction, Impaired flexibility, Decreased range of motion  Visit Diagnosis: Cervicalgia  Other muscle spasm  Abnormal posture     Problem List Patient Active Problem List   Diagnosis Date Noted  . Carpal tunnel syndrome of left wrist 12/26/2018  . Chronic migraine without aura without status migrainosus, not intractable 10/09/2018  . Cough variant asthma with ? component UACS 07/14/2017  . Gestational diabetes mellitus, class A2 07/15/2013  . Postpartum care following vaginal delivery (1/30) 07/15/2013  . Preterm uterine contractions 06/10/2013    Percival Spanish, PT, MPT 12/05/2019, 7:13 PM  [facility] [address]  [address] [place], [place], [address] Phone: [phone]   Fax:   [phone]  Name: Marylynne Keelin Depoy MRN: [id number] Date of Birth: 19-Nov-1985

## 2019-12-05 NOTE — Patient Instructions (Signed)

## 2019-12-09 ENCOUNTER — Ambulatory Visit: Payer: 59 | Admitting: Physical Therapy

## 2019-12-09 ENCOUNTER — Other Ambulatory Visit: Payer: Self-pay

## 2019-12-09 ENCOUNTER — Encounter: Payer: Self-pay | Admitting: Physical Therapy

## 2019-12-09 DIAGNOSIS — M542 Cervicalgia: Secondary | ICD-10-CM | POA: Diagnosis not present

## 2019-12-09 DIAGNOSIS — M62838 Other muscle spasm: Secondary | ICD-10-CM | POA: Diagnosis not present

## 2019-12-09 DIAGNOSIS — R293 Abnormal posture: Secondary | ICD-10-CM | POA: Diagnosis not present

## 2019-12-09 NOTE — Therapy (Addendum)
[facility] [address]  [place], [place], [address] Phone: [phone]   Fax:  [phone]  Physical Therapy Treatment  Patient Details  Name: Abigail Frank MRN: [id number] Date of Birth: 02-Sep-1985 Referring Provider (PT): Frann Rider, NP   Encounter Date: 12/09/2019   PT End of Session - 12/09/19 0929    Visit Number 4    Number of Visits 13    Date for PT Re-Evaluation 12/28/19    Authorization Type [facility]    PT Start Time 0929    PT Stop Time 1018    PT Time Calculation (min) 49 min    Activity Tolerance Patient tolerated treatment well    Behavior During Therapy [facility] for tasks assessed/performed           Past Medical History:  Diagnosis Date  . Anxiety   . Asthma   . Bone spur    cervical L side; L side arm numbness intermittent   . Depression   . Diabetes in pregnancy   . Elevated cholesterol with high triglycerides   . Gestational diabetes   . Hx of varicella   . IBS (irritable bowel syndrome)   . Kidney stones    followed by Dr. Tammi Klippel  . Migraines    without aura  . Ovarian cyst   . Postpartum care following vaginal delivery (1/30) 07/15/2013  . Preterm uterine contractions 06/10/2013    Past Surgical History:  Procedure Laterality Date  . MOUTH SURGERY     wisdom teeth extractions    There were no vitals filed for this visit.   Subjective Assessment - 12/09/19 0932    Subjective Pt notes great relief from DN in muscles near the bra line but still noting increased tension in UT. Had migraine yesterday which she felt was related to increased muscle tension in R neck as well as "family stress" as she was trying to help her grandmother move to a memory care ALF but her grandmother was refusing.    Pertinent History migraines, kidney stones, HLD, depression, asthma, anxiety    Diagnostic tests 01/04/19 cervical MRI: Mild degenerative changes at C3-C4 and C4-C5 more towards  the left.  There is moderate foraminal narrowing at C3-C4 and mild left foraminal narrowing at C4-C5 but no nerve root compression.  There is milder uncovertebral spurring at C5-C6 causing minimal foraminal narrowing but no nerve root compression.    Patient Stated Goals help with manage tension HAs    Currently in Pain? Yes    Pain Score 3    3-4/10   Pain Location Neck    Pain Orientation Right    Pain Descriptors / Indicators Sore   residual from migraine yesterday   Pain Type Chronic pain    Pain Frequency Intermittent                             [facility] Adult PT Treatment/Exercise - 12/09/19 0929      Exercises   Exercises Neck      Neck Exercises: Machines for Strengthening   UBE (Upper Arm Bike) L2.0 x 6 min (3' fwd/3' back)      Neck Exercises: Theraband   Shoulder External Rotation 10 reps;Red    Shoulder External Rotation Limitations + scap retraction, hooklying over pool noodle    Horizontal ABduction 10 reps;Red    Horizontal ABduction Limitations + scap retraction, hooklying over pool noodle  Manual Therapy   Manual Therapy Soft tissue mobilization;Myofascial release   Manual therapy comments skilled palpation and monitoring during DN    Soft tissue mobilization B UT, LS, C/S paraspinals, R scalenes & SCM   Myofascial Release manual TPR and pin & stretch to R UT, LS, scalenes & SCM      Neck Exercises: Stretches   Upper Trapezius Stretch Right;Left;30 seconds;1 rep    Levator Stretch Right;Left;30 seconds;1 rep    Neck Stretch 30 seconds;1 rep    Neck Stretch Limitations SCM/scalenes stretch            Trigger Point Dry Needling - 12/09/19 0929    Consent Given? Yes    Education Handout Provided Previously provided    Muscles Treated Head and Neck Upper trapezius;Levator scapulae;Sternocleidomastoid;Scalenes    Sternocleidomastoid Response Twitch response elicited;Palpable increased muscle length   Rt   Upper Trapezius Response Twitch  reponse elicited;Palpable increased muscle length   Rt   Levator Scapulae Response Twitch response elicited;Palpable increased muscle length   Bilateral   Scalenes Response Twitch reponse elicited;Palpable increased muscle length   Rt               PT Education - 12/09/19 1015    Education Details HEP update - UT, LS & SCM stretches, hooklying scap retraction + shoulder horiz ABD & ER    Person(s) Educated Patient    Methods Explanation;Demonstration;Handout    Comprehension Verbalized understanding;Returned demonstration;Need further instruction            PT Short Term Goals - 12/09/19 0935      PT SHORT TERM GOAL #1   Title Patient to be independent with initial HEP.    Time 3    Period Weeks    Status Achieved   12/09/19   Target Date --             PT Long Term Goals - 12/05/19 1535      PT LONG TERM GOAL #1   Title Patient to be independent with advanced HEP.    Time 6    Period Weeks    Status On-going    Target Date 12/28/19      PT LONG TERM GOAL #2   Title Patient to recall/demonstrate self-correction of posture at rest and with activity for improvement in postural awareness.    Time 6    Period Weeks    Status On-going    Target Date 12/28/19      PT LONG TERM GOAL #3   Title Patient to report 75% reduction in frequency and intensity of weekly HAs.    Time 6    Period Weeks    Status On-going    Target Date 12/28/19      PT LONG TERM GOAL #4   Title Patient to report 50% improvement in ability to interact with her kids d/t improvement in HAs and neck pain.    Time 6    Period Weeks    Status On-going    Target Date 12/28/19                 Plan - 12/09/19 1018    Clinical Impression Statement Abigail Frank reports good relief from DN last session with resolution of lower periscapular pain and muscle tension but notes some ongoing tension/tightness in R lower neck. She did have a migraine yesterday but states this may have been  partially triggered by "family stress" in addition to the muscle tension. Further  DN performed in conjunction with manual therapy to R UT, scalenes and SCM as well as B LS followed by instruction in associated stretches. Progressed postural strengthening with scapular focus with good tolerance. HEP updated to include new stretches and exercises.    Personal Factors and Comorbidities Age;Sex;Comorbidity 3+;Fitness;Past/Current Experience;Profession;Time since onset of injury/illness/exacerbation    Comorbidities migraines, kidney stones, HLD, depression, asthma, anxiety    Examination-Activity Limitations Sleep;Caring for Others;Carry;Stand;Dressing;Lift;Reach Overhead    Examination-Participation Restrictions Church;Cleaning;Shop;Community Activity;Driving;Yard Work;Interpersonal Relationship;Laundry;Meal Prep    Rehab Potential Good    PT Frequency 2x / week    PT Duration 6 weeks    PT Treatment/Interventions ADLs/Self Care Home Management;Cryotherapy;Electrical Stimulation;Moist Heat;Traction;Therapeutic exercise;Therapeutic activities;Functional mobility training;Ultrasound;Neuromuscular re-education;Patient/family education;Manual techniques;Taping;Energy conservation;Dry needling;Passive range of motion;Spinal Manipulations    PT Next Visit Plan STM/IASTM, DN, postural correction ther-ex    Consulted and Agree with Plan of Care Patient           Patient will benefit from skilled therapeutic intervention in order to improve the following deficits and impairments:  Hypomobility, Decreased activity tolerance, Decreased strength, Pain, Impaired UE functional use, Increased fascial restricitons, Increased muscle spasms, Improper body mechanics, Postural dysfunction, Impaired flexibility, Decreased range of motion  Visit Diagnosis: Cervicalgia  Other muscle spasm  Abnormal posture     Problem List Patient Active Problem List   Diagnosis Date Noted  . Carpal tunnel syndrome of left  wrist 12/26/2018  . Chronic migraine without aura without status migrainosus, not intractable 10/09/2018  . Cough variant asthma with ? component UACS 07/14/2017  . Gestational diabetes mellitus, class A2 07/15/2013  . Postpartum care following vaginal delivery (1/30) 07/15/2013  . Preterm uterine contractions 06/10/2013    Percival Spanish, PT, MPT 12/09/2019, 3:00 PM  [facility] [address]  [place], [place], [address] Phone: [phone]   Fax:  [phone]  Name: Abigail Frank MRN: [id number] Date of Birth: 12-19-1985

## 2019-12-09 NOTE — Patient Instructions (Signed)
    Home exercise program created by Lucita Montoya, PT.  For questions, please contact Zenya Hickam via phone at [phone] or email at [email or web address]  [facility] [address]  [address] [place], [place], [address] Phone: [phone]   Fax:  [phone]    

## 2019-12-13 ENCOUNTER — Ambulatory Visit: Payer: 59 | Admitting: Physical Therapy

## 2019-12-15 ENCOUNTER — Ambulatory Visit: Payer: 59 | Attending: Family Medicine | Admitting: Physical Therapy

## 2019-12-15 ENCOUNTER — Other Ambulatory Visit: Payer: Self-pay

## 2019-12-15 ENCOUNTER — Encounter: Payer: Self-pay | Admitting: Physical Therapy

## 2019-12-15 DIAGNOSIS — M542 Cervicalgia: Secondary | ICD-10-CM | POA: Insufficient documentation

## 2019-12-15 DIAGNOSIS — R293 Abnormal posture: Secondary | ICD-10-CM | POA: Diagnosis not present

## 2019-12-15 DIAGNOSIS — M62838 Other muscle spasm: Secondary | ICD-10-CM | POA: Insufficient documentation

## 2019-12-15 NOTE — Therapy (Signed)
[facility] [address]  [place], [place], [address] Phone: [phone]   Fax:  [phone]  Physical Therapy Treatment  Patient Details  Name: Abigail Frank MRN: [id number] Date of Birth: 01-Feb-1986 Referring Provider (PT): Frann Rider, NP   Encounter Date: 12/15/2019   PT End of Session - 12/15/19 0849    Visit Number 5    Number of Visits 13    Date for PT Re-Evaluation 12/28/19    Authorization Type [facility]    PT Start Time 0849    PT Stop Time 0930    PT Time Calculation (min) 41 min    Activity Tolerance Patient tolerated treatment well    Behavior During Therapy [facility] for tasks assessed/performed           Past Medical History:  Diagnosis Date  . Anxiety   . Asthma   . Bone spur    cervical L side; L side arm numbness intermittent   . Depression   . Diabetes in pregnancy   . Elevated cholesterol with high triglycerides   . Gestational diabetes   . Hx of varicella   . IBS (irritable bowel syndrome)   . Kidney stones    followed by Dr. Tammi Klippel  . Migraines    without aura  . Ovarian cyst   . Postpartum care following vaginal delivery (1/30) 07/15/2013  . Preterm uterine contractions 06/10/2013    Past Surgical History:  Procedure Laterality Date  . MOUTH SURGERY     wisdom teeth extractions    There were no vitals filed for this visit.   Subjective Assessment - 12/15/19 0851    Subjective Pt reports she feels like she has never been able to drop the R shoulder as well as she has since the DN last session but the L still feels elevated. Did have a tension headache on Tuesday, but no way near as severe as normal and did not progress to migraine.    Pertinent History migraines, kidney stones, HLD, depression, asthma, anxiety    Diagnostic tests 01/04/19 cervical MRI: Mild degenerative changes at C3-C4 and C4-C5 more towards the left.  There is moderate foraminal narrowing at C3-C4 and  mild left foraminal narrowing at C4-C5 but no nerve root compression.  There is milder uncovertebral spurring at C5-C6 causing minimal foraminal narrowing but no nerve root compression.    Patient Stated Goals help with manage tension HAs    Currently in Pain? Yes    Pain Score 4     Pain Location Neck    Pain Orientation Left    Pain Descriptors / Indicators Discomfort    Pain Type Chronic pain    Pain Frequency Intermittent                             [facility] Adult PT Treatment/Exercise - 12/15/19 0849      Exercises   Exercises Neck      Neck Exercises: Machines for Strengthening   UBE (Upper Arm Bike) L2.5 x 6 min (3' fwd/3' back)      Manual Therapy   Manual Therapy Soft tissue mobilization;Myofascial release    Manual therapy comments skilled palpation and monitoring during DN     Soft tissue mobilization B UT, LS, C/S paraspinals, and L>R scalenes & SCM    Myofascial Release manual TPR and pin & stretch to B UT, LS, cervicothoracic parapsinals, scalenes &  [facility]            Trigger Point Dry Needling - 12/15/19 0849    Consent Given? Yes    Muscles Treated Head and Neck Upper trapezius;Levator scapulae;Sternocleidomastoid;Scalenes;Cervical multifidi    Sternocleidomastoid Response Twitch response elicited;Palpable increased muscle length   Lt   Upper Trapezius Response Twitch reponse elicited;Palpable increased muscle length   Bil   Levator Scapulae Response Twitch response elicited;Palpable increased muscle length   Bilateral   Scalenes Response Twitch reponse elicited;Palpable increased muscle length   Lt   Cervical multifidi Response Twitch reponse elicited;Palpable increased muscle length   Bil                 PT Short Term Goals - 12/09/19 0935      PT SHORT TERM GOAL #1   Title Patient to be independent with initial HEP.    Time 3    Period Weeks    Status Achieved   12/09/19   Target Date --             PT Long Term Goals -  12/05/19 1535      PT LONG TERM GOAL #1   Title Patient to be independent with advanced HEP.    Time 6    Period Weeks    Status On-going    Target Date 12/28/19      PT LONG TERM GOAL #2   Title Patient to recall/demonstrate self-correction of posture at rest and with activity for improvement in postural awareness.    Time 6    Period Weeks    Status On-going    Target Date 12/28/19      PT LONG TERM GOAL #3   Title Patient to report 75% reduction in frequency and intensity of weekly HAs.    Time 6    Period Weeks    Status On-going    Target Date 12/28/19      PT LONG TERM GOAL #4   Title Patient to report 50% improvement in ability to interact with her kids d/t improvement in HAs and neck pain.    Time 6    Period Weeks    Status On-going    Target Date 12/28/19                 Plan - 12/15/19 0930    Clinical Impression Statement Belladonna noting feeling of "lopsidedness" after last visit with significant improvement in ability to relax and drop R shoulder but continued restriction on L with pain more focused to L side today. She continues to experience "tension HAs" but these have been less intense and not progressing to migraines as frequently - progressing toward LTG #2. Addressed B neck and upper shoulders today with slight increased attention to L side with pt appearing more symmetrical by end of session. She will benefit from further progression of postural strengthening and cervicothoracic mobilization in upcoming visits to reduce recurrence of increased muscle tension and associated tension headaches.    Personal Factors and Comorbidities Age;Sex;Comorbidity 3+;Fitness;Past/Current Experience;Profession;Time since onset of injury/illness/exacerbation    Comorbidities migraines, kidney stones, HLD, depression, asthma, anxiety    Examination-Activity Limitations Sleep;Caring for Others;Carry;Stand;Dressing;Lift;Reach Overhead    Examination-Participation  Restrictions Church;Cleaning;Shop;Community Activity;Driving;Yard Work;Interpersonal Relationship;Laundry;Meal Prep    Rehab Potential Good    PT Frequency 2x / week    PT Duration 6 weeks    PT Treatment/Interventions ADLs/Self Care Home Management;Cryotherapy;Electrical Stimulation;Moist Heat;Traction;Therapeutic exercise;Therapeutic activities;Functional mobility training;Ultrasound;Neuromuscular re-education;Patient/family education;Manual techniques;Taping;Energy conservation;Dry needling;Passive range  of motion;Spinal Manipulations    PT Next Visit Plan STM/IASTM, DN, postural correction ther-ex, cervicothoracic mobilization    Consulted and Agree with Plan of Care Patient           Patient will benefit from skilled therapeutic intervention in order to improve the following deficits and impairments:  Hypomobility, Decreased activity tolerance, Decreased strength, Pain, Impaired UE functional use, Increased fascial restricitons, Increased muscle spasms, Improper body mechanics, Postural dysfunction, Impaired flexibility, Decreased range of motion  Visit Diagnosis: Cervicalgia  Other muscle spasm  Abnormal posture     Problem List Patient Active Problem List   Diagnosis Date Noted  . Carpal tunnel syndrome of left wrist 12/26/2018  . Chronic migraine without aura without status migrainosus, not intractable 10/09/2018  . Cough variant asthma with ? component UACS 07/14/2017  . Gestational diabetes mellitus, class A2 07/15/2013  . Postpartum care following vaginal delivery (1/30) 07/15/2013  . Preterm uterine contractions 06/10/2013    Percival Spanish, PT, MPT 12/15/2019, 12:47 PM  [facility] [address]  [place], [place], [address] Phone: [phone]   Fax:  [phone]  Name: Gabryella Murfin Pine MRN: [id number] Date of Birth: 11/24/1985

## 2019-12-19 MED FILL — AIMOVIG 140 MG/ML SOAJ: 140 | 30 days supply | Qty: 1 | Fill #2

## 2019-12-27 ENCOUNTER — Encounter: Payer: Self-pay | Admitting: Adult Health

## 2019-12-27 MED FILL — RIZATRIPTAN 10 MG ODT: 10 | 30 days supply | Qty: 9 | Fill #0

## 2019-12-28 ENCOUNTER — Ambulatory Visit: Payer: 59 | Admitting: Physical Therapy

## 2019-12-28 ENCOUNTER — Other Ambulatory Visit: Payer: Self-pay | Admitting: Adult Health

## 2019-12-28 MED ORDER — NURTEC 75 MG PO TBDP: 75.0000 mg | ORAL_TABLET | Freq: Every day | ORAL | 3 refills | Status: DC

## 2019-12-28 MED FILL — NURTEC 75 MG TBDP: 75 | 30 days supply | Qty: 8 | Fill #0

## 2019-12-28 NOTE — Progress Notes (Signed)
Submitted PA for Nurtec 75 on Cover my Meds. Key: [id number]   The request has been approved. The authorization is effective for a maximum of 6 fills from 12/28/2019 to 06/28/2020, as long as the member is enrolled in their current health plan. This has been approved for a quantity limit of 18.0 with a day supply limit of 30.0. A written notification letter will follow with additional details.  [facility] notified of PA approval

## 2019-12-30 ENCOUNTER — Encounter: Payer: 59 | Admitting: Physical Therapy

## 2020-01-02 ENCOUNTER — Encounter: Payer: Self-pay | Admitting: Adult Health

## 2020-01-02 ENCOUNTER — Other Ambulatory Visit: Payer: Self-pay | Admitting: Adult Health

## 2020-01-02 MED ORDER — GABAPENTIN 300 MG PO CAPS: 300.0000 mg | ORAL_CAPSULE | Freq: Two times a day (BID) | ORAL | 3 refills | Status: DC

## 2020-01-02 MED FILL — GABAPENTIN 300 MG CAPSULE: 300 | 30 days supply | Qty: 30 | Fill #1

## 2020-01-02 MED FILL — BACLOFEN 10 MG TABS: 10 | 15 days supply | Qty: 15 | Fill #1

## 2020-01-02 MED FILL — CITALOPRAM HBR 20 MG TABLET: 20 | 30 days supply | Qty: 30 | Fill #1

## 2020-01-03 ENCOUNTER — Encounter: Payer: 59 | Admitting: Physical Therapy

## 2020-01-04 ENCOUNTER — Ambulatory Visit: Payer: 59 | Admitting: Physical Therapy

## 2020-01-04 ENCOUNTER — Other Ambulatory Visit: Payer: Self-pay

## 2020-01-04 ENCOUNTER — Encounter: Payer: Self-pay | Admitting: Physical Therapy

## 2020-01-04 DIAGNOSIS — R293 Abnormal posture: Secondary | ICD-10-CM

## 2020-01-04 DIAGNOSIS — M542 Cervicalgia: Secondary | ICD-10-CM

## 2020-01-04 DIAGNOSIS — M62838 Other muscle spasm: Secondary | ICD-10-CM

## 2020-01-04 NOTE — Patient Instructions (Signed)
TENS UNIT  This is helpful for muscle pain and spasm.   Search and Purchase a TENS 7000 2nd edition at www.tenspros.com or www.amazon.com  (It should be less than $30)     TENS unit instructions:   Do not shower or bathe with the unit on  Turn the unit off before removing electrodes or batteries  If the electrodes lose stickiness add a drop of water to the electrodes after they are disconnected from the unit and place on plastic sheet. If you continued to have difficulty, call the TENS unit company to purchase more electrodes.  Do not apply lotion on the skin area prior to use. Make sure the skin is clean and dry as this will help prolong the life of the electrodes.  After use, always check skin for unusual red areas, rash or other skin difficulties. If there are any skin problems, does not apply electrodes to the same area.  Never remove the electrodes from the unit by pulling the wires.  Do not use the TENS unit or electrodes other than as directed.  Do not change electrode placement without consulting your therapist or physician.  Keep 2 fingers with between each electrode.   TENS stands for Transcutaneous Electrical Nerve Stimulation. In other words, electrical impulses are allowed to pass through the skin in order to excite a nerve.   Purpose and Use of TENS:  TENS is a method used to manage acute and chronic pain without the use of drugs. It has been effective in managing pain associated with surgery, sprains, strains, trauma, rheumatoid arthritis, and neuralgias. It is a non-addictive, low risk, and non-invasive technique used to control pain. It is not, by any means, a curative form of treatment.   How TENS Works:  Most TENS units are a Paramedic unit powered by one 9 volt battery. Attached to the outside of the unit are two lead wires where two pins and/or snaps connect on each wire. All units come with a set of four reusable pads or electrodes. These are placed  on the skin surrounding the area involved. By inserting the leads into  the pads, the electricity can pass from the unit making the circuit complete.  As the intensity is turned up slowly, the electrical current enters the body from the electrodes through the skin to the surrounding nerve fibers. This triggers the release of hormones from within the body. These hormones contain pain relievers. By increasing the circulation of these hormones, the persons pain may be lessened. It is also believed that the electrical stimulation itself helps to block the pain messages being sent to the brain, thus also decreasing the bodys perception of pain.   Hazards:  TENS units are NOT to be used by patients with PACEMAKERS, DEFIBRILLATORS, DIABETIC PUMPS, PREGNANT WOMEN, and patients with SEIZURE DISORDERS.  TENS units are NOT to be used over the heart, throat, brain, or spinal cord.  One of the major side effects from the TENS unit may be skin irritation. Some people may develop a rash if they are sensitive to the materials used in the electrodes or the connecting wires.   Wear the unit for up to 30-45 minutes at a time, up to 3x/day.   Avoid overuse due the body getting used to the stem making it not as effective over time.

## 2020-01-04 NOTE — Therapy (Addendum)
[facility] [address]  [place], [place], [address] Phone: [phone]   Fax:  [phone]  Physical Therapy Treatment / Recert  Patient Details  Name: Abigail Frank MRN: [id number] Date of Birth: Aug 11, 1985 Referring Provider (PT): Frann Rider, NP   Encounter Date: 01/04/2020   PT End of Session - 01/04/20 0850    Visit Number 6    Number of Visits 10    Date for PT Re-Evaluation 02/01/20    Authorization Type [facility]    PT Start Time 0850    PT Stop Time 0949    PT Time Calculation (min) 59 min    Activity Tolerance Patient tolerated treatment well    Behavior During Therapy [facility] for tasks assessed/performed           Past Medical History:  Diagnosis Date  . Anxiety   . Asthma   . Bone spur    cervical L side; L side arm numbness intermittent   . Depression   . Diabetes in pregnancy   . Elevated cholesterol with high triglycerides   . Gestational diabetes   . Hx of varicella   . IBS (irritable bowel syndrome)   . Kidney stones    followed by Dr. Tammi Klippel  . Migraines    without aura  . Ovarian cyst   . Postpartum care following vaginal delivery (1/30) 07/15/2013  . Preterm uterine contractions 06/10/2013    Past Surgical History:  Procedure Laterality Date  . MOUTH SURGERY     wisdom teeth extractions    There were no vitals filed for this visit.   Subjective Assessment - 01/04/20 0854    Subjective Pt reports she had been doing realy well but started having pain again as of Monday w/o known trigger - may have slept wrong. Overall feels her shoulders have been really loose and has not had much in the way of headaches.    Pertinent History migraines, kidney stones, HLD, depression, asthma, anxiety    Diagnostic tests 01/04/19 cervical MRI: Mild degenerative changes at C3-C4 and C4-C5 more towards the left.  There is moderate foraminal narrowing at C3-C4 and mild left foraminal  narrowing at C4-C5 but no nerve root compression.  There is milder uncovertebral spurring at C5-C6 causing minimal foraminal narrowing but no nerve root compression.    Patient Stated Goals help with manage tension HAs    Currently in Pain? 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      Observation/Other Assessments   Focus on Therapeutic Outcomes (FOTO)  Neck - 73% (27% limitation)  [facility] PT Treatment/Exercise - 01/04/20 0850      Self-Care   Self-Care Posture    Posture Review of proper alignement for neutral spine while sleeping on side or back      Exercises   Exercises Neck      Neck Exercises: Machines for Strengthening   UBE (Upper Arm Bike) L2.5 x 6 min (3' fwd/3' back)      Modalities   Modalities Electrical Stimulation;Moist Heat      Moist Heat Therapy   Number Minutes Moist Heat 15 Minutes    Moist Heat Location Cervical      Electrical Stimulation   Electrical Stimulation Location B UT/scalenes    Electrical Stimulation Action TENS    Electrical Stimulation Parameters SD1, intensity to pt tol x 15'    Electrical Stimulation Goals Pain;Tone      Manual Therapy   Manual Therapy Soft tissue mobilization;Myofascial release    Manual therapy comments skilled palpation and monitoring during DN     Soft tissue mobilization B UT, LS, C/S paraspinals  and scalenes/SCM - taut bands primarily in R scalenes    Myofascial Release manual TPR and pin & stretch to R scalenes, SCM & UT            Trigger Point Dry Needling - 01/04/20 0850    Consent Given? Yes    Muscles Treated Head and Neck Scalenes   Rt   Scalenes Response Twitch reponse elicited;Palpable increased muscle length   Rt               PT Education - 01/04/20 0930    Education Details Role/purpose of TENS, home TENS unit options including precautions, electrode placement and general set-up    Person(s) Educated Patient    Methods Explanation;Demonstration;Handout    Comprehension Verbalized understanding;Returned demonstration            PT Short Term Goals - 12/09/19 0935      PT SHORT TERM GOAL #1   Title Patient to be independent with initial HEP.    Time 3    Period Weeks    Status Achieved   12/09/19   Target Date --             PT Long Term Goals - 01/04/20 0857      PT LONG TERM GOAL #1   Title Patient to be independent with advanced HEP.    Time --    Period --    Status Achieved   01/04/20 - met for current HEP     PT LONG TERM GOAL #2   Title Patient to recall/demonstrate self-correction of posture at rest and with activity for improvement in postural awareness.    Time 4    Period Weeks    Status Partially Met   01/04/20 - notes better awareness but stil "50/50" with consistency   Target Date 02/01/20      PT LONG TERM GOAL #3   Title Patient to report 75% reduction in frequency and intensity of weekly HAs.    Time 4    Period Weeks    Status Partially Met   01/04/20 - Pt reports at least 50% reduction in HA frequency and intensity   Target Date 02/01/20      PT LONG TERM GOAL #4   Title Patient to report 50% improvement in ability to interact with her kids d/t improvement in HAs and neck pain.    Time --    Period --  Status Achieved   01/04/20 - Pt notes 70% improvement in ability to interact with her kids.                 Plan - 01/04/20 1410    Clinical Impression Statement Abigail Frank reports good benefit from PT over past few weeks with significant reduction in muscle tension ("shoulders feel really loose") and ~50% reduction in frequency and intensity of headaches allowing for 70% improvement in her ability to interact with her children, although notes increase in pain and tightness on R side of neck as of Monday which she feels may have been due to a bad sleeping position. Reviewed ideal alignment for sleeping both on side and in supine to promote neutral spine position. Palpation of cervical and upper shoulder musculature revealing taut tender band in R scalenes but otherwise no significant increased muscle tension - addressed taut band with MT and DN with good twitch response and palpable reduction in muscle tension and ttp. Pt inquiring about use of home TENS unit to help with pain and increased muscle tension PRN, therefore provided options for home TENS unit as well as instruction in setup and use of TENS unit with TENS utilized to promote further reduction in pain and muscle tension following DN and MT today. Pt reports HEP going well with benefit noted from stretches and postural strengthening exercise and denies need for review today. Abigail Frank has demonstrated good progress with goals - STG and LTGs #1 & 4 met, with remaining LTGs at least partially met. FOTO also exceeding predicted level on limitation (35%) with current limitation only 27%. Patient hopeful that she will be able to continue to manage her pain with just her HEP, but in the event she will need to return to PT will recert with frequency of 1x/wk for up to 4 weeks.    Personal Factors and Comorbidities Time since onset of injury/illness/exacerbation;Past/Current Experience;Profession;Comorbidity 3+    Comorbidities migraines, kidney stones, HLD, depression, asthma, anxiety    Examination-Activity Limitations Sleep;Caring for Others     Rehab Potential Good    PT Frequency 1x / week    PT Duration 4 weeks    PT Treatment/Interventions ADLs/Self Care Home Management;Cryotherapy;Electrical Stimulation;Moist Heat;Traction;Therapeutic exercise;Therapeutic activities;Functional mobility training;Ultrasound;Neuromuscular re-education;Patient/family education;Manual techniques;Taping;Energy conservation;Dry needling;Passive range of motion;Spinal Manipulations    PT Next Visit Plan STM/IASTM, DN, postural correction ther-ex, cervicothoracic mobilization    Consulted and Agree with Plan of Care Patient           Patient will benefit from skilled therapeutic intervention in order to improve the following deficits and impairments:  Hypomobility, Decreased activity tolerance, Decreased strength, Pain, Impaired UE functional use, Increased fascial restricitons, Increased muscle spasms, Improper body mechanics, Postural dysfunction, Impaired flexibility, Decreased range of motion  Visit Diagnosis: Cervicalgia  Other muscle spasm  Abnormal posture     Problem List Patient Active Problem List   Diagnosis Date Noted  . Carpal tunnel syndrome of left wrist 12/26/2018  . Chronic migraine without aura without status migrainosus, not intractable 10/09/2018  . Cough variant asthma with ? component UACS 07/14/2017  . Gestational diabetes mellitus, class A2 07/15/2013  . Postpartum care following vaginal delivery (1/30) 07/15/2013  . Preterm uterine contractions 06/10/2013    Percival Spanish, PT, MPT 01/04/2020, 2:25 PM  [facility] [address]  [place], [place], [address] Phone: [phone]   Fax:  [phone]  Name: Terianna Peggs Winokur MRN: [id number] Date of Birth:  03-15-1986  PHYSICAL THERAPY DISCHARGE SUMMARY  Visits from Start of Care: 6  Current functional level related to goals / functional outcomes: See above clinical impression; patient did not  return, noting she would schedule as needed   Remaining deficits: Decreased postural awareness, HAs   Education / Equipment: HEP  Plan: Patient agrees to discharge.  Patient goals were partially met. Patient is being discharged due to not returning since the last visit.  ?????     Janene Harvey, PT, DPT 02/13/20 1:46 PM

## 2020-01-05 ENCOUNTER — Encounter: Payer: 59 | Admitting: Physical Therapy

## 2020-01-09 ENCOUNTER — Telehealth: Payer: Self-pay | Admitting: Obstetrics & Gynecology

## 2020-01-09 ENCOUNTER — Encounter: Payer: Self-pay | Admitting: Obstetrics & Gynecology

## 2020-01-09 NOTE — Telephone Encounter (Signed)
Spoke with patient. She is in week 2 of 2nd pack of Loestrin Fe. Reports BTB, menses started again 4 days ago, changing non-saturated pad q2-3 hours. Reports menses cramps and clotting, denies severe pain or heavy bleeding. No missed or late pills, takes roughly same time daily. Blood is darker than her normal menses. Denies fever/chills, N/V or s/s of anemia.   Wants Dr. Sabra Heck to know hot flashes are improved w/ Celexa.  Blisters on breast are healing, still present.  Advised patient can take at least 3 months for cycles to adjust to new OCP. Advised patient to continue to monitor and calendar menses, return call to office if new symptoms develop or bleeding becomes heavy, changing saturated pad/tampon q1-2 hours. Keep f/u OV as scheduled for 02/27/20. Will update Dr. Sabra Heck and return call if any additional recommendations. Patient agreeable.   Routing to provider for final review. Patient is agreeable to disposition. Will close encounter.

## 2020-01-09 NOTE — Telephone Encounter (Signed)
Abnormal mid-cycle bleeding Received: Today Dery, Arville Lime sent to [facility] Dr. Sabra Heck!  I wanted to reach out to you to get your opinion on some mid-cycle bleeding that I have never had before. I am been taking the birth control pills as prescribed. I just finished my period a week and a half ago. I am about 1 1/2 weeks into my new birth control pack and I am having another period. It is heavy like my normal flow and I am having abdominal pain/clotting per my usual cycle. I started the pills in line with my normal menstrual cycle so I am uncertain as to why I am experiencing this. I am under a lot of stress at home and that my be related but I have always only had one period per 30 days. Just wanted to touch base with you and see if this is anything to be concerned about or if I should just continue to monitor symptoms. I have currently been bleeding for 4 days and it has not started to taper off yet. The sweating flare ups are somewhat better but they still occur and those pesky lesions on the breasts are still there. They haven't progressed nor have they attempted to  heal either. I am at a loss. Thanks so much for all your help. Hope you are having a wonderful day. :)

## 2020-01-10 ENCOUNTER — Other Ambulatory Visit: Payer: Self-pay | Admitting: Obstetrics & Gynecology

## 2020-01-10 MED ORDER — NORETHINDRONE ACET-ETHINYL EST 1.5-30 MG-MCG PO TABS: 1.0000 | ORAL_TABLET | Freq: Every day | ORAL | 0 refills | Status: DC

## 2020-01-10 MED FILL — LARIN 1.5 MG-30 MCG TABLET: 1.5-30 | 84 days supply | Qty: 84 | Fill #0

## 2020-01-10 NOTE — Telephone Encounter (Signed)
It is ok to increase to Loestrin 1.5/30 with next pack if pt desires.  Her BTB is lasting several days so I don't think it will improve with another pack.  Ok to send in rx for 1 month or 3 month supply per pt request.  Thanks.

## 2020-01-10 NOTE — Telephone Encounter (Signed)
Spoke with patient, advised as seen below per Dr. Sabra Heck. Patient agreeable to plan, new Rx sent to verified pharmacy. Patient verbalizes understanding and is agreeable.  Encounter closed.

## 2020-01-10 NOTE — Addendum Note (Signed)
Addended by: Burnice Logan on: 01/10/2020 08:08 AM   Modules accepted: Orders

## 2020-01-19 DIAGNOSIS — M4302 Spondylolysis, cervical region: Secondary | ICD-10-CM | POA: Diagnosis not present

## 2020-01-19 DIAGNOSIS — J4520 Mild intermittent asthma, uncomplicated: Secondary | ICD-10-CM | POA: Diagnosis not present

## 2020-01-19 DIAGNOSIS — G43909 Migraine, unspecified, not intractable, without status migrainosus: Secondary | ICD-10-CM | POA: Diagnosis not present

## 2020-01-19 DIAGNOSIS — Z0000 Encounter for general adult medical examination without abnormal findings: Secondary | ICD-10-CM | POA: Diagnosis not present

## 2020-01-19 DIAGNOSIS — E781 Pure hyperglyceridemia: Secondary | ICD-10-CM | POA: Diagnosis not present

## 2020-01-19 DIAGNOSIS — J302 Other seasonal allergic rhinitis: Secondary | ICD-10-CM | POA: Diagnosis not present

## 2020-01-19 DIAGNOSIS — E669 Obesity, unspecified: Secondary | ICD-10-CM | POA: Diagnosis not present

## 2020-01-19 DIAGNOSIS — Z8241 Family history of sudden cardiac death: Secondary | ICD-10-CM | POA: Diagnosis not present

## 2020-01-19 DIAGNOSIS — Z6836 Body mass index (BMI) 36.0-36.9, adult: Secondary | ICD-10-CM | POA: Diagnosis not present

## 2020-01-19 DIAGNOSIS — R7302 Impaired glucose tolerance (oral): Secondary | ICD-10-CM | POA: Diagnosis not present

## 2020-01-19 MED FILL — SERTRALINE HCL 50 MG TABLET: 50 | 90 days supply | Qty: 90 | Fill #0

## 2020-01-25 MED FILL — SERTRALINE HCL 50 MG TABLET: 50 | 90 days supply | Qty: 90 | Fill #0

## 2020-01-30 MED FILL — BACLOFEN 10 MG TABS: 10 | 15 days supply | Qty: 15 | Fill #2

## 2020-01-30 MED FILL — AIMOVIG 140 MG/ML SOAJ: 140 | 30 days supply | Qty: 1 | Fill #3

## 2020-01-30 MED FILL — GABAPENTIN 300 MG CAPSULE: 300 | 90 days supply | Qty: 180 | Fill #0

## 2020-02-22 DIAGNOSIS — H9202 Otalgia, left ear: Secondary | ICD-10-CM | POA: Diagnosis not present

## 2020-02-22 DIAGNOSIS — H6692 Otitis media, unspecified, left ear: Secondary | ICD-10-CM | POA: Diagnosis not present

## 2020-02-22 MED FILL — AMOX-CLAV 875-125 MG TABLET: 875-125 | 10 days supply | Qty: 20 | Fill #0

## 2020-02-27 ENCOUNTER — Ambulatory Visit: Payer: Self-pay | Admitting: Obstetrics & Gynecology

## 2020-02-29 MED FILL — tiZANidine HCL 4 MG TABS: 4 | 15 days supply | Qty: 60 | Fill #1

## 2020-02-29 MED FILL — NURTEC 75 MG TBDP: 75 | 30 days supply | Qty: 8 | Fill #1

## 2020-02-29 MED FILL — AIMOVIG 140 MG/ML SOAJ: 140 | 30 days supply | Qty: 1 | Fill #4

## 2020-03-02 DIAGNOSIS — L281 Prurigo nodularis: Secondary | ICD-10-CM | POA: Diagnosis not present

## 2020-03-02 DIAGNOSIS — R21 Rash and other nonspecific skin eruption: Secondary | ICD-10-CM | POA: Diagnosis not present

## 2020-03-05 ENCOUNTER — Ambulatory Visit: Payer: Self-pay | Admitting: Obstetrics & Gynecology

## 2020-03-05 ENCOUNTER — Encounter: Payer: Self-pay | Admitting: Obstetrics & Gynecology

## 2020-03-09 NOTE — Progress Notes (Signed)
GYNECOLOGY  VISIT  CC:   Recheck 3 moths  HPI: 34 y.o. G51P2002 Married White or Caucasian female here for 24mh ocp & breast recheck.  She has seen Dr. SDerrel Nip dermatology.  Was seen a couple of weeks ago.  Has undergone a skin biopsy.  Lesion was biopsied but this was inconclusive.  Dr. SDerrel Nipthoughts maybe this was auto-immune mediated.  No blood work was done.  Pt's mother had this as well towards the end of her life.  She was 65 at her death.  She had congestive heart failure.  This does cause concerns for pt.    She was thought it was related to increased sweating.  She is sure this is related to the zoloft.  She did stop Zoloft and switched to Celexa 246m  She was having breakthrough anxiety.  She restarted.  The sweating restarted so we know this is a medication side effect.  She has tried to lower the dosage and anxiety has increased.  She does have a prescription from Dr. TiOsborne Cascoor Xanax.  She has seen a therapist in the past.  Medication has helped.  Cycles seem to to have regulated and flow is much, much lighter.  She is not having BTB now.    GYNECOLOGIC HISTORY: Patient's last menstrual period was 02/15/2020 (exact date). Contraception: ocp Menopausal hormone therapy: none  Patient Active Problem List   Diagnosis Date Noted   Carpal tunnel syndrome of left wrist 12/26/2018   Chronic migraine without aura without status migrainosus, not intractable 10/09/2018   Gestational diabetes mellitus, class A2 07/15/2013    Past Medical History:  Diagnosis Date   Anxiety    Asthma    Bone spur    cervical L side; L side arm numbness intermittent    Depression    Diabetes in pregnancy    Elevated cholesterol with high triglycerides    Gestational diabetes    Hx of varicella    IBS (irritable bowel syndrome)    Kidney stones    followed by Dr. MaTammi Klippel Migraines    without aura   Ovarian cyst    Postpartum care following vaginal delivery (1/30)  07/15/2013   Preterm uterine contractions 06/10/2013    Past Surgical History:  Procedure Laterality Date   MOUTH SURGERY     wisdom teeth extractions    MEDS:   Current Outpatient Medications on File Prior to Visit  Medication Sig Dispense Refill   AIMOVIG 140 MG/ML SOAJ INJECT 140 MG INTO THE SKIN EVERY 30 DAYS. 1 mL 11   albuterol (PROVENTIL HFA;VENTOLIN HFA) 108 (90 BASE) MCG/ACT inhaler Inhale 2 puffs into the lungs every 6 (six) hours as needed for wheezing or shortness of breath.     baclofen (LIORESAL) 10 MG tablet TAKE 1/2 TABLET BY MOUTH TWICE DAILY AS NEEDED (AT MIGRAINE ONSET) 15 tablet 2   fluticasone (FLOVENT HFA) 110 MCG/ACT inhaler Inhale 1 puff into the lungs 2 (two) times daily.     gabapentin (NEURONTIN) 300 MG capsule Take 1 capsule (300 mg total) by mouth 2 (two) times daily. 180 capsule 3   ibuprofen (ADVIL) 600 MG tablet Take 600 mg by mouth every 6 (six) hours as needed.     indapamide (LOZOL) 1.25 MG tablet Take 1.25 mg by mouth daily.     lidocaine (XYLOCAINE) 5 % ointment Apply small amount to skin as directed no more than three times daily. 30 g 0   metoCLOPramide (REGLAN) 10 MG tablet Take  three times a day while having migraine or nausea. 90 tablet 1   ondansetron (ZOFRAN-ODT) 4 MG disintegrating tablet Take 1 tablet (4 mg total) by mouth every 6 (six) hours as needed for nausea. Or for headache. 30 tablet 3   Rimegepant Sulfate (NURTEC) 75 MG TBDP Take 75 mg by mouth daily. 10 tablet 3   rizatriptan (MAXALT-MLT) 10 MG disintegrating tablet DISSOLVE 1 TABLET BY MOUTH AS NEEDED FOR MIGRAINE. MAY REPEAT IN 2 HOURS IF NEEDED 9 tablet 4   sertraline (ZOLOFT) 50 MG tablet Take 50 mg by mouth daily.     tiZANidine (ZANAFLEX) 4 MG tablet Take 1 tablet (4 mg total) by mouth every 6 (six) hours as needed. For migraines/headaches or muscle pain. 60 tablet 6   No current facility-administered medications on file prior to visit.    ALLERGIES: Bactrim  [sulfamethoxazole-trimethoprim], Sulfa antibiotics, and Vicodin [hydrocodone-acetaminophen]  Family History  Problem Relation Age of Onset   Early death Maternal Grandfather 54       ?pulmonary disease   Pulmonary fibrosis Maternal Grandfather    Hypertension Mother    Hypothyroidism Mother    Heart attack Mother    Diabetes Mother    Heart disease Mother        heart failure   Dementia Mother    Kidney disease Mother    Hypertension Father    Hypothyroidism Father    Diabetes Father    Obesity Father    Cancer Maternal Grandmother        colon   Dementia Maternal Grandmother    Hypertension Maternal Grandmother    Seizures Child        thinks its genetic, only seizes if she hits her head,thinks its migraine related    Migraines Neg Hx     SH:  Married, non smoker  Review of Systems  All other systems reviewed and are negative.   PHYSICAL EXAMINATION:    BP 108/70    Pulse 68    Resp 16    Wt 205 lb (93 kg)    LMP 02/15/2020 (Exact Date)    BMI 37.49 kg/m     General appearance: alert, cooperative and appears stated age Skin:  Multiple scars on breast, biopsy site on left breast  Assessment: Recurrent skin blistering, possibly autoimmune Menorrhagia that is improved Sweating that we know that is related to Zoloft Anxiety, not well controlled H/o menstrual migraines improved on OCPs  Plan: Will obtained transglutaminase Ab, ESR, ANA Will continue OCPs.  rx to pharmacy. Change to Celexa 24m daily.  Rx to pharmacy.    32 minutes spent with pt in discussion, coordination of care and documentation.

## 2020-03-12 ENCOUNTER — Other Ambulatory Visit: Payer: Self-pay | Admitting: Adult Health

## 2020-03-12 MED FILL — BACLOFEN 10 MG TABS: 10 | 15 days supply | Qty: 15 | Fill #0

## 2020-03-13 ENCOUNTER — Ambulatory Visit: Payer: 59 | Admitting: Obstetrics & Gynecology

## 2020-03-13 ENCOUNTER — Other Ambulatory Visit: Payer: Self-pay

## 2020-03-13 ENCOUNTER — Other Ambulatory Visit: Payer: Self-pay | Admitting: Obstetrics & Gynecology

## 2020-03-13 ENCOUNTER — Encounter: Payer: Self-pay | Admitting: Obstetrics & Gynecology

## 2020-03-13 VITALS — BP 108/70 | HR 68 | Resp 16 | Wt 205.0 lb

## 2020-03-13 DIAGNOSIS — N920 Excessive and frequent menstruation with regular cycle: Secondary | ICD-10-CM

## 2020-03-13 DIAGNOSIS — L308 Other specified dermatitis: Secondary | ICD-10-CM | POA: Diagnosis not present

## 2020-03-13 DIAGNOSIS — R61 Generalized hyperhidrosis: Secondary | ICD-10-CM

## 2020-03-13 DIAGNOSIS — R197 Diarrhea, unspecified: Secondary | ICD-10-CM

## 2020-03-13 MED ORDER — CITALOPRAM HYDROBROMIDE 40 MG PO TABS: 40.0000 mg | ORAL_TABLET | Freq: Every day | ORAL | 1 refills | Status: DC

## 2020-03-13 MED ORDER — NORETHINDRONE ACET-ETHINYL EST 1.5-30 MG-MCG PO TABS
1.0000 | ORAL_TABLET | Freq: Every day | ORAL | 3 refills | Status: DC
Start: 2020-03-13 — End: 2020-03-13

## 2020-03-13 MED FILL — CITALOPRAM HBR 40 MG TABLET: 40 | 90 days supply | Qty: 90 | Fill #0

## 2020-03-14 LAB — ANA: Anti Nuclear Antibody (ANA): NEGATIVE

## 2020-03-14 LAB — SEDIMENTATION RATE: Sed Rate: 26 mm/hr (ref 0–32)

## 2020-03-14 LAB — TISSUE TRANSGLUTAMINASE, IGA: Transglutaminase IgA: <2 U/mL (ref 0–3)

## 2020-03-15 MED FILL — RIZATRIPTAN 10 MG ODT: 10 | 30 days supply | Qty: 9 | Fill #1

## 2020-03-15 MED FILL — ONDANSETRON ODT 4 MG TABLET: 4 | 7 days supply | Qty: 30 | Fill #1

## 2020-04-02 ENCOUNTER — Telehealth: Payer: Self-pay | Admitting: Neurology

## 2020-04-02 MED FILL — AIMOVIG 140 MG/ML SOAJ: 140 | 30 days supply | Qty: 1 | Fill #5

## 2020-04-02 NOTE — Telephone Encounter (Signed)
Received a PA renewal for Aimovig 140mg . PA was started on [email or web address]. Key is [place]. Information was entered into [email or web address] but did not instantly receive a question set. Will check back tomorrow for the question set.

## 2020-04-03 DIAGNOSIS — M6289 Other specified disorders of muscle: Secondary | ICD-10-CM

## 2020-04-03 DIAGNOSIS — M436 Torticollis: Secondary | ICD-10-CM

## 2020-04-03 NOTE — Telephone Encounter (Signed)
Received a response from [employer] stating that the member should be able to get medication without a PA. Will send a MyChart message to patient to make sure she has been able to get her medication.

## 2020-04-12 MED FILL — RIZATRIPTAN 10 MG ODT: 10 | 30 days supply | Qty: 9 | Fill #2

## 2020-04-24 MED FILL — BACLOFEN 10 MG TABS: 10 | 15 days supply | Qty: 15 | Fill #1

## 2020-04-26 MED FILL — AIMOVIG 140 MG/ML SOAJ: 140 | 30 days supply | Qty: 1 | Fill #6

## 2020-05-07 MED FILL — AIMOVIG 140 MG/ML SOAJ: 140 | 30 days supply | Qty: 1 | Fill #6

## 2020-05-15 MED FILL — RIZATRIPTAN 10 MG ODT: 10 | 30 days supply | Qty: 9 | Fill #3

## 2020-05-21 NOTE — Telephone Encounter (Signed)
The request has been approved. The authorization is effective for a maximum of 12 fills from 05/21/2020 to 05/20/2021, as long as the member is enrolled in their current health plan. This has been approved for a quantity limit of 1.0 with a day supply limit of 30.0. A written notification letter will follow with additional details.

## 2020-05-21 NOTE — Telephone Encounter (Signed)
We received another Aimovig 140 mg PA. I completed this on Cover My Meds. Key: [id number]. Awaiting determination from [facility].

## 2020-05-22 NOTE — Telephone Encounter (Signed)
Received Aimovig approval letter from [place]. I faxed this to [place] as Abigail Frank. Received a receipt of confirmation.

## 2020-05-24 NOTE — Addendum Note (Signed)
Addended by: Gildardo Griffes on: 05/24/2020 07:16 AM   Modules accepted: Orders

## 2020-05-24 NOTE — Telephone Encounter (Signed)
Per JM NP, ok to place v.o. to renew PT eval for dry needling. Order placed.

## 2020-05-31 MED FILL — AIMOVIG 140 MG/ML SOAJ: 140 | 30 days supply | Qty: 1 | Fill #7

## 2020-05-31 MED FILL — tiZANidine HCL 4 MG TABS: 4 | 15 days supply | Qty: 60 | Fill #2

## 2020-06-04 ENCOUNTER — Encounter: Payer: Self-pay | Admitting: Adult Health

## 2020-06-04 ENCOUNTER — Other Ambulatory Visit: Payer: Self-pay | Admitting: Adult Health

## 2020-06-04 MED ORDER — METHYLPREDNISOLONE 4 MG PO TBPK: ORAL_TABLET | ORAL | 0 refills | Status: DC

## 2020-06-04 MED FILL — METHYLPREDNISOLONE 4 MG TBP: 4 | 6 days supply | Qty: 21 | Fill #0

## 2020-06-11 MED FILL — CITALOPRAM HBR 40 MG TABLET: 40 | 90 days supply | Qty: 90 | Fill #1

## 2020-06-19 ENCOUNTER — Telehealth: Payer: Self-pay | Admitting: *Deleted

## 2020-06-19 MED FILL — NURTEC 75 MG TBDP: 75 | 30 days supply | Qty: 8 | Fill #2

## 2020-06-19 NOTE — Telephone Encounter (Signed)
Completed Nurtec PA on Cover My Meds. Key: [id number]. [employer] approved immediately.   The request has been approved. The authorization is effective for a maximum of 12 fills from 06/19/2020 to 06/18/2021, as long as the member is enrolled in their current health plan. This has been approved for a quantity limit of 18.0 with a day supply limit of 30.0. A written notification letter will follow with additional details.

## 2020-06-20 ENCOUNTER — Encounter: Payer: Self-pay | Admitting: Physical Therapy

## 2020-06-20 ENCOUNTER — Other Ambulatory Visit: Payer: Self-pay

## 2020-06-20 ENCOUNTER — Ambulatory Visit: Payer: 59 | Attending: Adult Health | Admitting: Physical Therapy

## 2020-06-20 DIAGNOSIS — G44221 Chronic tension-type headache, intractable: Secondary | ICD-10-CM | POA: Insufficient documentation

## 2020-06-20 DIAGNOSIS — M542 Cervicalgia: Secondary | ICD-10-CM | POA: Diagnosis not present

## 2020-06-20 DIAGNOSIS — M62838 Other muscle spasm: Secondary | ICD-10-CM | POA: Diagnosis not present

## 2020-06-20 DIAGNOSIS — R293 Abnormal posture: Secondary | ICD-10-CM | POA: Diagnosis not present

## 2020-06-20 NOTE — Therapy (Signed)
[facility] [address]  [address] [place], [place], [address] Phone: [phone]   Fax:  [phone]  Physical Therapy Evaluation  Patient Details  Name: Abigail Frank MRN: [id number] Date of Birth: November 14, 1985 Referring Provider (PT): Ihor Austin, NP   Encounter Date: 06/20/2020   PT End of Session - 06/20/20 1000    Visit Number 1    Number of Visits 6    Date for PT Re-Evaluation 08/01/20    Authorization Type [facility]    PT Start Time 1000    PT Stop Time 1054    PT Time Calculation (min) 54 min    Activity Tolerance Patient tolerated treatment well    Behavior During Therapy [facility] for tasks assessed/performed           Past Medical History:  Diagnosis Date  . Anxiety   . Asthma   . Bone spur    cervical L side; L side arm numbness intermittent   . Depression   . Diabetes in pregnancy   . Elevated cholesterol with high triglycerides   . Gestational diabetes   . Hx of varicella   . IBS (irritable bowel syndrome)   . Kidney stones    followed by Dr. Kathrynn Running  . Migraines    without aura  . Ovarian cyst   . Postpartum care following vaginal delivery (1/30) 07/15/2013  . Preterm uterine contractions 06/10/2013    Past Surgical History:  Procedure Laterality Date  . MOUTH SURGERY     wisdom teeth extractions    There were no vitals filed for this visit.    Subjective Assessment - 06/20/20 1004    Subjective Pt felts like she was good for a long time following last PT episode but started noting increased muscle tension and headaches/migraines around Christmas time. Currently having ~2-3 migraines per week but sometimes she can catch it with meds before it gets too bad, so typically only 1 severe migraine per week to the point where she cannot function for the day. She reports she has kept up with stretches from prior PT episode daily and has incorporated some of strengthening program into her workouts  (dedicated workout at least 1x/wk).    Patient Stated Goals "get that control back over my migraines (back to prior level of of 2-3 migraines per month)"    Currently in Pain? Yes    Pain Score 4    3-4/10   Pain Location Neck    Pain Orientation Right;Lateral    Pain Descriptors / Indicators Aching;Throbbing   "deep ache/throb"   Pain Type Acute pain;Chronic pain    Pain Radiating Towards up into back of head (occiput) and someimes frontal              [facility] PT Assessment - 06/20/20 1000      Assessment   Medical Diagnosis Neck stiffness, muscle stiffness    Referring Provider (PT) Ihor Austin, NP    Onset Date/Surgical Date --   Dec 2021   Hand Dominance Left    Next MD Visit 11/05/20    Prior Therapy 6 visits: 11/16/2019 - 01/04/2020 for same problem      Precautions   Precautions None      Restrictions   Weight Bearing Restrictions No      Balance Screen   Has the patient fallen in the past 6 months No    Has the patient had a decrease in activity level because of  a fear of falling?  No    Is the patient reluctant to leave their home because of a fear of falling?  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      Posture/Postural Control   Posture/Postural Control Postural limitations    Postural Limitations Forward head;Rounded Shoulders    Posture Comments slight Dowager's hump evident; R shoulder elevated and slight protracted      AROM   Cervical Flexion 45   tight at base of neck   Cervical Extension 51    Cervical - Right Side Bend 46    Cervical - Left Side Bend 25    Cervical - Right Rotation 60   pain on  R side of neck   Cervical - Left Rotation 72      Strength   Right Shoulder Flexion 4/5    Right Shoulder ABduction 5/5    Right Shoulder Internal Rotation 5/5    Right Shoulder External Rotation 4+/5    Left Shoulder Flexion 4/5    Left Shoulder ABduction 5/5    Left Shoulder Internal Rotation 5/5    Left Shoulder External Rotation 4+/5                      Objective measurements completed on examination: See above findings.       [facility] PT Treatment/Exercise - 06/20/20 1000      Manual Therapy   Manual Therapy Soft tissue mobilization;Myofascial release;Passive ROM;Manual Traction    Manual therapy comments skilled palpation and monitoring during DN     Soft tissue mobilization B UT, LS, C/S paraspinals, R scalenes & SCM    Myofascial Release manual TPR and pin & stretch to R>L UT, LS, cervicothoracic parapsinals & scalenes; suboccipital release    Passive ROM gentle PROM for all cervical motions as well as UT, LS & SCM stretches    Manual Traction gentle supine cervical distraction            Trigger Point Dry Needling - 06/20/20 1000    Consent Given? Yes    Education Handout Provided Previously provided   pt provided handout on previous PT episode and declined new copy today   Muscles Treated Head and Neck Upper trapezius;Levator scapulae;Scalenes;Semispinalis capitus    Upper Trapezius Response Twitch reponse elicited;Palpable increased muscle length   Bilateral   Levator Scapulae Response Twitch response elicited;Palpable increased muscle length   Rt   Scalenes Response Twitch reponse elicited;Palpable increased muscle length   Rt   Semispinalis capitus Response Twitch reponse elicited;Palpable increased muscle length                     PT Long Term Goals - 06/20/20 1054      PT LONG TERM GOAL #1   Title Patient will be independent with ongoing/advanced HEP for self-management at home    Status New    Target Date 08/01/20      PT  LONG TERM GOAL #2   Title Improve posture and alignment with patient to demonstrate  improved upright posture with posterior shoulder girdle engaged    Status New    Target Date 08/01/20      PT LONG TERM GOAL #3   Title .Patient to report >/= 50-75% reduction in frequency and intensity of weekly headaches/migraines    Status New    Target Date 08/01/20      PT LONG TERM GOAL #4   Title Patient to report >/= 50% improvement in ability to interact with her kids d/t improvement in HAs and neck pain    Status New    Target Date 08/01/20                  Plan - 06/20/20 1054    Clinical Impression Statement Abigail Frank is a 35 y/o female who presents to OP PT for acute on chronic neck pain and stiffness with chronic migraines worsening over the past month. She completed a prior PT episode with 6 visits from 11/16/2019 - 01/04/2020 for same issues with good relief of pain and significant reduction in migraine frequency to only 2-3 migraines per month which she could typically mitigate with medications. Tension headaches and migraines have increased to 2-3x/wk over the past month with increased muscle tension and pain noted in R lateral neck. Pt attributes worsening of symptoms to stress as she helped move her grandmother, who she had previously been caring for in her home, into memory care at an ALF at the beginning of December and the initial transition was rough for her grandmother. 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 She reports regular ongoing daily performance of HEP stretches from last episode but admits to more intermittent completion of strengthening exercises. She denies radicular symptoms at this time but does localize muscle pain in R scalenes and notes headaches/migraines typically originate in the suboccipitals and wraps around to the top of the head in a ram's horn pattern (R>L). Deficits include poor posture with a slight  forward head and rounded shoulder (R>L) posture along with elevated R shoulder; increased muscle tension noted throughout R>L subocciptals, cervicothoracic paraspinals, lateral cervical musculature, UT/LS, rhomboids and mid/lower trap, and pectoralis muscles; cervical and upper thoracic vertebrae hypomobility and mildly limited and painful cervical AROM most evident in rotation and side bending. Bilateral shoulder ROM WNL but mild weakness noted in R shoulder flexion. Abigail Frank will benefit from skilled PT to address above deficits to decrease frequency and intensity of migraines/headaches to reduce pain interference with daily activities. She reports excellent benefit from DN last episode with good relief noted with initial DN today.    Personal Factors and Comorbidities Time since onset of injury/illness/exacerbation;Past/Current Experience;Profession;Comorbidity 3+    Comorbidities migraines, kidney stones, HLD, depression, asthma, anxiety    Examination-Activity Limitations Sleep;Caring for Others    Examination-Participation Restrictions Church;Cleaning;Shop;Community Activity;Driving;Yard Work;Interpersonal Relationship;Laundry;Meal Prep    Stability/Clinical Decision Making Stable/Uncomplicated    Clinical Decision Making Low    Rehab Potential Good    PT Frequency 1x / week    PT Duration 6 weeks    PT Treatment/Interventions ADLs/Self Care Home Management;Cryotherapy;Electrical Stimulation;Iontophoresis 4mg /ml Dexamethasone;Moist Heat;Traction;Ultrasound;Functional mobility training;Therapeutic activities;Therapeutic exercise;Neuromuscular re-education;Patient/family education;Manual techniques;Passive range of motion;Dry needling;Taping;Spinal Manipulations    PT Next Visit Plan STM/IASTM, DN, postural correction ther-ex, cervicothoracic mobilization    Consulted and Agree with Plan of Care Patient           Patient will benefit from skilled therapeutic intervention in order to improve  the following deficits and impairments:  Decreased activity tolerance,Decreased range of motion,Decreased strength,Hypomobility,Increased fascial restricitons,Increased muscle spasms,Impaired perceived functional ability,Impaired flexibility,Improper body mechanics,Postural dysfunction,Pain  Visit Diagnosis: Cervicalgia - Plan: PT plan of care cert/re-cert  Chronic tension-type headache, intractable - Plan: PT plan of care cert/re-cert  Other muscle spasm - Plan: PT plan of care cert/re-cert  Abnormal posture - Plan: PT plan of care cert/re-cert     Problem List Patient Active Problem List   Diagnosis Date Noted  . Carpal tunnel syndrome of left wrist 12/26/2018  . Chronic migraine without aura without status migrainosus, not intractable 10/09/2018  . Gestational diabetes mellitus, class A2 07/15/2013    Percival Spanish, PT, MPT 06/20/2020, 2:40 PM  [facility] [address]  [place], [place], [address] Phone: [phone]   Fax:  [phone]  Name: Abigail Frank MRN: [id number] Date of Birth: [birth year]

## 2020-06-27 ENCOUNTER — Other Ambulatory Visit: Payer: Self-pay

## 2020-06-27 ENCOUNTER — Ambulatory Visit: Payer: 59 | Admitting: Physical Therapy

## 2020-06-27 ENCOUNTER — Encounter: Payer: Self-pay | Admitting: Physical Therapy

## 2020-06-27 DIAGNOSIS — M62838 Other muscle spasm: Secondary | ICD-10-CM

## 2020-06-27 DIAGNOSIS — M542 Cervicalgia: Secondary | ICD-10-CM | POA: Diagnosis not present

## 2020-06-27 DIAGNOSIS — G44221 Chronic tension-type headache, intractable: Secondary | ICD-10-CM | POA: Diagnosis not present

## 2020-06-27 DIAGNOSIS — R293 Abnormal posture: Secondary | ICD-10-CM

## 2020-06-27 NOTE — Therapy (Signed)
[facility] [address]  [place], [place], [address] Phone: [phone]   Fax:  [phone]  Physical Therapy Treatment  Patient Details  Name: Abigail Frank MRN: [id number] Date of Birth: 1985-09-04 Referring Provider (PT): Frann Rider, NP   Encounter Date: 06/27/2020   PT End of Session - 06/27/20 0849    Visit Number 2    Number of Visits 6    Date for PT Re-Evaluation 08/01/20    Authorization Type [facility]    PT Start Time 0849    PT Stop Time 0929    PT Time Calculation (min) 40 min    Activity Tolerance Patient tolerated treatment well    Behavior During Therapy [facility] for tasks assessed/performed           Past Medical History:  Diagnosis Date  . Anxiety   . Asthma   . Bone spur    cervical L side; L side arm numbness intermittent   . Depression   . Diabetes in pregnancy   . Elevated cholesterol with high triglycerides   . Gestational diabetes   . Hx of varicella   . IBS (irritable bowel syndrome)   . Kidney stones    followed by Dr. Tammi Klippel  . Migraines    without aura  . Ovarian cyst   . Postpartum care following vaginal delivery (1/30) 07/15/2013  . Preterm uterine contractions 06/10/2013    Past Surgical History:  Procedure Laterality Date  . MOUTH SURGERY     wisdom teeth extractions    There were no vitals filed for this visit.   Subjective Assessment - 06/27/20 0853    Subjective Pt notes good relief from DN last session with decreased soreness but still feels a little tight in her UT.    Patient Stated Goals "get that control back over my migraines (back to prior level of of 2-3 migraines per month)"    Currently in Pain? Yes    Pain Score 2    0-2/10 depending on movement   Pain Location Neck    Pain Orientation Right;Lateral    Pain Descriptors / Indicators Tightness    Pain Type Acute pain;Chronic pain    Pain Frequency Intermittent                              [facility] Adult PT Treatment/Exercise - 06/27/20 0849      Exercises   Exercises Neck      Neck Exercises: Machines for Strengthening   UBE (Upper Arm Bike) L2.0 x 6 min (3' fwd/3' back)      Manual Therapy   Manual Therapy Soft tissue mobilization;Myofascial release    Manual therapy comments skilled palpation and monitoring during DN     Soft tissue mobilization B UT, LS, C/S paraspinals, scalenes & pecs    Myofascial Release manual TPR and pin & stretch to B UT, LS, scalenes and pecs            Trigger Point Dry Needling - 06/27/20 0849    Consent Given? Yes    Muscles Treated Head and Neck Upper trapezius;Levator scapulae;Scalenes   bilateral   Muscles Treated Upper Quadrant Pectoralis major;Pectoralis minor   bilateral   Upper Trapezius Response Twitch reponse elicited;Palpable increased muscle length    Levator Scapulae Response Twitch response elicited;Palpable increased muscle length    Scalenes Response Twitch reponse elicited;Palpable increased muscle length  Pectoralis Major Response Twitch response elicited;Palpable increased muscle length    Pectoralis Minor Response Twitch response elicited;Palpable increased muscle length                     PT Long Term Goals - 06/27/20 0851      PT LONG TERM GOAL #1   Title Patient will be independent with ongoing/advanced HEP for self-management at home    Status On-going    Target Date 08/01/20      PT LONG TERM GOAL #2   Title Improve posture and alignment with patient to demonstrate improved upright posture with posterior shoulder girdle engaged    Status On-going    Target Date 08/01/20      PT LONG TERM GOAL #3   Title .Patient to report >/= 50-75% reduction in frequency and intensity of weekly headaches/migraines    Status On-going    Target Date 08/01/20      PT LONG TERM GOAL #4   Title Patient to report >/= 50% improvement in ability to interact with her kids  d/t improvement in HAs and neck pain    Status On-going    Target Date 08/01/20                 Plan - 06/27/20 0853    Clinical Impression Statement Abigail Frank reports good relief from DN on initial eval with decreased soreness and now more just tightness in neck and upper shoulders. HEP going well with pt noting red TB getting a little easy, therefore provided green TB for HEP progression. MT and DN performed addressing ongoing increased muscle tension/tightness in UT, LS, scalenes and pecs with good twitch responses elicited resulting in palpable reduction in muscle tension.    Comorbidities migraines, kidney stones, HLD, depression, asthma, anxiety    Rehab Potential Good    PT Frequency 2x / week    PT Duration 6 weeks    PT Treatment/Interventions ADLs/Self Care Home Management;Cryotherapy;Electrical Stimulation;Iontophoresis 4mg /ml Dexamethasone;Moist Heat;Traction;Ultrasound;Functional mobility training;Therapeutic activities;Therapeutic exercise;Neuromuscular re-education;Patient/family education;Manual techniques;Passive range of motion;Dry needling;Taping;Spinal Manipulations    PT Next Visit Plan STM/IASTM, DN, postural correction ther-ex, cervicothoracic mobilization    Consulted and Agree with Plan of Care Patient           Patient will benefit from skilled therapeutic intervention in order to improve the following deficits and impairments:  Decreased activity tolerance,Decreased range of motion,Decreased strength,Hypomobility,Increased fascial restricitons,Increased muscle spasms,Impaired perceived functional ability,Impaired flexibility,Improper body mechanics,Postural dysfunction,Pain  Visit Diagnosis: Cervicalgia  Chronic tension-type headache, intractable  Other muscle spasm  Abnormal posture     Problem List Patient Active Problem List   Diagnosis Date Noted  . Carpal tunnel syndrome of left wrist 12/26/2018  . Chronic migraine without aura without  status migrainosus, not intractable 10/09/2018  . Gestational diabetes mellitus, class A2 07/15/2013    Percival Spanish, PT, MPT 06/27/2020, 10:43 AM  [facility] [address]  [place], [place], [address] Phone: [phone]   Fax:  [phone]  Name: Abigail Frank MRN: [id number] Date of Birth: 04/21/1986

## 2020-06-28 ENCOUNTER — Other Ambulatory Visit: Payer: Self-pay | Admitting: Obstetrics & Gynecology

## 2020-06-28 MED ORDER — FLUCONAZOLE 150 MG PO TABS: ORAL_TABLET | ORAL | 0 refills | Status: DC

## 2020-06-28 MED FILL — FLUCONAZOLE 150 MG TABS: 150 | 3 days supply | Qty: 2 | Fill #0

## 2020-07-03 MED FILL — AIMOVIG 140 MG/ML SOAJ: 140 | 30 days supply | Qty: 1 | Fill #8

## 2020-07-04 ENCOUNTER — Ambulatory Visit: Payer: 59 | Admitting: Physical Therapy

## 2020-07-05 MED FILL — BACLOFEN 10 MG TABS: 10 | 15 days supply | Qty: 15 | Fill #2

## 2020-07-05 MED FILL — GABAPENTIN 300 MG CAPSULE: 300 | 90 days supply | Qty: 180 | Fill #1

## 2020-07-05 MED FILL — RIZATRIPTAN 10 MG ODT: 10 | 30 days supply | Qty: 9 | Fill #4

## 2020-07-11 ENCOUNTER — Encounter: Payer: Self-pay | Admitting: Physical Therapy

## 2020-07-18 ENCOUNTER — Other Ambulatory Visit: Payer: Self-pay

## 2020-07-18 ENCOUNTER — Ambulatory Visit: Payer: 59 | Attending: Adult Health | Admitting: Physical Therapy

## 2020-07-18 ENCOUNTER — Encounter: Payer: Self-pay | Admitting: Physical Therapy

## 2020-07-18 DIAGNOSIS — M62838 Other muscle spasm: Secondary | ICD-10-CM | POA: Insufficient documentation

## 2020-07-18 DIAGNOSIS — R293 Abnormal posture: Secondary | ICD-10-CM | POA: Insufficient documentation

## 2020-07-18 DIAGNOSIS — G44221 Chronic tension-type headache, intractable: Secondary | ICD-10-CM | POA: Diagnosis not present

## 2020-07-18 DIAGNOSIS — M542 Cervicalgia: Secondary | ICD-10-CM | POA: Diagnosis not present

## 2020-07-18 NOTE — Therapy (Signed)
[facility] [address]  [place], [place], [address] Phone: [phone]   Fax:  [phone]  Physical Therapy Treatment  Patient Details  Name: Abigail Frank MRN: [id number] Date of Birth: Feb 22, 1986 Referring Provider (PT): Frann Rider, NP   Encounter Date: 07/18/2020   PT End of Session - 07/18/20 2549    Visit Number 3    Number of Visits 6    Date for PT Re-Evaluation 08/01/20    Authorization Type [facility]    PT Start Time 0928    PT Stop Time 1021    PT Time Calculation (min) 53 min    Activity Tolerance Patient tolerated treatment well    Behavior During Therapy [facility] for tasks assessed/performed           Past Medical History:  Diagnosis Date  . Anxiety   . Asthma   . Bone spur    cervical L side; L side arm numbness intermittent   . Depression   . Diabetes in pregnancy   . Elevated cholesterol with high triglycerides   . Gestational diabetes   . Hx of varicella   . IBS (irritable bowel syndrome)   . Kidney stones    followed by Dr. Tammi Klippel  . Migraines    without aura  . Ovarian cyst   . Postpartum care following vaginal delivery (1/30) 07/15/2013  . Preterm uterine contractions 06/10/2013    Past Surgical History:  Procedure Laterality Date  . MOUTH SURGERY     wisdom teeth extractions    There were no vitals filed for this visit.   Subjective Assessment - 07/18/20 0929    Subjective Pt reports she has a headache this morning which is making her feel somewhat off balance.    Patient Stated Goals "get that control back over my migraines (back to prior level of of 2-3 migraines per month)"    Currently in Pain? Yes    Pain Score 3     Pain Location Neck    Pain Orientation Left    Pain Descriptors / Indicators Tightness   "stiffness"   Pain Type Acute pain;Chronic pain    Pain Frequency Intermittent    Pain Score 7    Pain Location Head    Pain Orientation Right    frontal over eye   Pain Descriptors / Indicators Headache    Pain Type Acute pain                             [facility] Adult PT Treatment/Exercise - 07/18/20 0928      Exercises   Exercises Neck      Neck Exercises: Machines for Strengthening   UBE (Upper Arm Bike) L2.0 x 6 min (3' fwd/3' back)      Manual Therapy   Manual Therapy Soft tissue mobilization;Myofascial release    Manual therapy comments skilled palpation and monitoring during DN     Soft tissue mobilization B UT, LS, C/S paraspinals, scalenes & suboccipitals    Myofascial Release manual TPR and pin & stretch to B UT & LS; L>R suboccipital release            Trigger Point Dry Needling - 07/18/20 0928    Consent Given? Yes    Muscles Treated Head and Neck Upper trapezius;Levator scapulae;Scalenes;Suboccipitals    Upper Trapezius Response Twitch reponse elicited;Palpable increased muscle length   Bilateral   Suboccipitals Response  Twitch response elicited;Palpable increased muscle length   Lt   Levator Scapulae Response Twitch response elicited;Palpable increased muscle length   Bilateral   Scalenes Response Twitch reponse elicited;Palpable increased muscle length   Lt                    PT Long Term Goals - 07/18/20 0932      PT LONG TERM GOAL #1   Title Patient will be independent with ongoing/advanced HEP for self-management at home    Status Partially Met    Target Date 08/01/20      PT LONG TERM GOAL #2   Title Improve posture and alignment with patient to demonstrate improved upright posture with posterior shoulder girdle engaged    Status On-going    Target Date 08/01/20      PT LONG TERM GOAL #3   Title .Patient to report >/= 50-75% reduction in frequency and intensity of weekly headaches/migraines    Status Achieved   07/18/20 - pt reporting at least 50% reduction in headache - down to only ~1/wk     PT LONG TERM GOAL #4   Title Patient to report >/= 50% improvement in  ability to interact with her kids d/t improvement in HAs and neck pain    Status Achieved   07/18/20 - 75% improved                Plan - 07/18/20 0934    Clinical Impression Statement Abigail Frank returning for the first time in ~3 weeks due to quarantining for COVID exposure and work conflicts. She reports a headache today but notes 50% reduction in frequency and intensity of headaches allowing for 75% improvement in ability to interact with her kids - LTGs #3 & 4 met. Postural awareness improving and HEP going well with pt denying need for further review or update today - remaining LTGs partially met. Given current HA and continued tension noted in L>R suboccipitals, cervical paraspinals and upper shoulder musculature, proceeded with MT incorporating DN with good twitch responses elicited resulting in palpable reduction in muscle tension and decrease in HS intensity to ~5/10. Session concluded with estim and moist heat to promote further muscle relaxation and pain relief.    Comorbidities migraines, kidney stones, HLD, depression, asthma, anxiety    Rehab Potential Good    PT Frequency 2x / week    PT Duration 6 weeks    PT Treatment/Interventions ADLs/Self Care Home Management;Cryotherapy;Electrical Stimulation;Iontophoresis 50m/ml Dexamethasone;Moist Heat;Traction;Ultrasound;Functional mobility training;Therapeutic activities;Therapeutic exercise;Neuromuscular re-education;Patient/family education;Manual techniques;Passive range of motion;Dry needling;Taping;Spinal Manipulations    PT Next Visit Plan STM/IASTM, DN, postural correction ther-ex, cervicothoracic mobilization    Consulted and Agree with Plan of Care Patient           Patient will benefit from skilled therapeutic intervention in order to improve the following deficits and impairments:  Decreased activity tolerance,Decreased range of motion,Decreased strength,Hypomobility,Increased fascial restricitons,Increased muscle  spasms,Impaired perceived functional ability,Impaired flexibility,Improper body mechanics,Postural dysfunction,Pain  Visit Diagnosis: Cervicalgia  Chronic tension-type headache, intractable  Other muscle spasm  Abnormal posture     Problem List Patient Active Problem List   Diagnosis Date Noted  . Carpal tunnel syndrome of left wrist 12/26/2018  . Chronic migraine without aura without status migrainosus, not intractable 10/09/2018  . Gestational diabetes mellitus, class A2 07/15/2013    JPercival Spanish PT, MPT 07/18/2020, 11:43 AM  [facility] [place] [address]  Phone: [phone]   Fax:  [phone]  Name: Abigail Frank MRN: [id number] Date of Birth: Dec 31, 1985

## 2020-07-25 ENCOUNTER — Ambulatory Visit: Payer: 59 | Admitting: Physical Therapy

## 2020-07-30 ENCOUNTER — Other Ambulatory Visit: Payer: Self-pay | Admitting: Adult Health

## 2020-07-30 ENCOUNTER — Other Ambulatory Visit: Payer: Self-pay | Admitting: Neurology

## 2020-07-30 MED ORDER — METHYLPREDNISOLONE 4 MG PO TBPK
ORAL_TABLET | ORAL | 0 refills | Status: DC
Start: 2020-07-30 — End: 2020-07-30

## 2020-07-30 MED FILL — METHYLPREDNISOLONE 4 MG TBP: 4 | 6 days supply | Qty: 21 | Fill #0

## 2020-08-01 ENCOUNTER — Encounter: Payer: Self-pay | Admitting: Physical Therapy

## 2020-08-01 ENCOUNTER — Ambulatory Visit: Payer: 59 | Admitting: Physical Therapy

## 2020-08-01 ENCOUNTER — Other Ambulatory Visit: Payer: Self-pay

## 2020-08-01 DIAGNOSIS — E781 Pure hyperglyceridemia: Secondary | ICD-10-CM | POA: Diagnosis not present

## 2020-08-01 DIAGNOSIS — R293 Abnormal posture: Secondary | ICD-10-CM | POA: Diagnosis not present

## 2020-08-01 DIAGNOSIS — M62838 Other muscle spasm: Secondary | ICD-10-CM | POA: Diagnosis not present

## 2020-08-01 DIAGNOSIS — E669 Obesity, unspecified: Secondary | ICD-10-CM | POA: Diagnosis not present

## 2020-08-01 DIAGNOSIS — J4520 Mild intermittent asthma, uncomplicated: Secondary | ICD-10-CM | POA: Diagnosis not present

## 2020-08-01 DIAGNOSIS — G43909 Migraine, unspecified, not intractable, without status migrainosus: Secondary | ICD-10-CM | POA: Diagnosis not present

## 2020-08-01 DIAGNOSIS — J302 Other seasonal allergic rhinitis: Secondary | ICD-10-CM | POA: Diagnosis not present

## 2020-08-01 DIAGNOSIS — Z8241 Family history of sudden cardiac death: Secondary | ICD-10-CM | POA: Diagnosis not present

## 2020-08-01 DIAGNOSIS — G44221 Chronic tension-type headache, intractable: Secondary | ICD-10-CM | POA: Diagnosis not present

## 2020-08-01 DIAGNOSIS — M4302 Spondylolysis, cervical region: Secondary | ICD-10-CM | POA: Diagnosis not present

## 2020-08-01 DIAGNOSIS — M542 Cervicalgia: Secondary | ICD-10-CM

## 2020-08-01 DIAGNOSIS — R7302 Impaired glucose tolerance (oral): Secondary | ICD-10-CM | POA: Diagnosis not present

## 2020-08-01 DIAGNOSIS — Z6836 Body mass index (BMI) 36.0-36.9, adult: Secondary | ICD-10-CM | POA: Diagnosis not present

## 2020-08-01 NOTE — Therapy (Addendum)
[facility] [address]  [place], [place], [address] Phone: [phone]   Fax:  [phone]  Physical Therapy Treatment / Discharge Summary  Patient Details  Name: Brenlee Koskela Kilduff MRN: [id number] Date of Birth: 11-25-1985 Referring Provider (PT): Frann Rider, NP   Encounter Date: 08/01/2020   PT End of Session - 08/01/20 0935    Visit Number 4    Number of Visits 6    Date for PT Re-Evaluation 08/01/20    Authorization Type [facility]    PT Start Time 0935    PT Stop Time 1014    PT Time Calculation (min) 39 min    Activity Tolerance Patient tolerated treatment well    Behavior During Therapy [facility] for tasks assessed/performed           Past Medical History:  Diagnosis Date  . Anxiety   . Asthma   . Bone spur    cervical L side; L side arm numbness intermittent   . Depression   . Diabetes in pregnancy   . Elevated cholesterol with high triglycerides   . Gestational diabetes   . Hx of varicella   . IBS (irritable bowel syndrome)   . Kidney stones    followed by Dr. Tammi Klippel  . Migraines    without aura  . Ovarian cyst   . Postpartum care following vaginal delivery (1/30) 07/15/2013  . Preterm uterine contractions 06/10/2013    Past Surgical History:  Procedure Laterality Date  . MOUTH SURGERY     wisdom teeth extractions    There were no vitals filed for this visit.   Subjective Assessment - 08/01/20 0937    Subjective Pt reports her shoulders feel great but does note some soreness in R posterior/lateral neck along SCM. Thinks it may be irritated from sleeping in her daughter's bed since her cat was killed by a car.    Patient Stated Goals "get that control back over my migraines (back to prior level of of 2-3 migraines per month)"    Currently in Pain? Yes    Pain Score 4    3-4/10   Pain Location Neck    Pain Orientation Right    Pain Descriptors / Indicators Tightness;Sore    Pain  Type Acute pain;Chronic pain              [facility] PT Assessment - 08/01/20 0935      Assessment   Medical Diagnosis Neck stiffness, muscle stiffness    Referring Provider (PT) Frann Rider, NP    Onset Date/Surgical Date --   Dec 2021   Hand Dominance Left    Next MD Visit 11/05/20      AROM   Cervical Flexion 56    Cervical Extension 64    Cervical - Right Side Bend 43    Cervical - Left Side Bend 38    Cervical - Right Rotation 68    Cervical - Left Rotation 70      Strength   Right Shoulder Flexion 5/5    Right Shoulder ABduction 5/5    Right Shoulder Internal Rotation 5/5    Right Shoulder External Rotation 5/5    Left Shoulder Flexion 5/5    Left Shoulder ABduction 5/5    Left Shoulder Internal Rotation 5/5    Left Shoulder External Rotation 5/5  [place] [facility] PT Treatment/Exercise - 08/01/20 0935      Exercises   Exercises Neck      Manual Therapy   Manual Therapy Soft tissue mobilization;Myofascial release    Manual therapy comments skilled palpation and monitoring during DN     Soft tissue mobilization R UT, C/S paraspinals, scalenes & SCM, B LS    Myofascial Release manual TPR and pin & stretch to R UT, LS, scalenes & SCM      Neck Exercises: Stretches   Neck Stretch 2 reps;30 seconds    Neck Stretch Limitations R scalene & SCM stretches            Trigger Point Dry Needling - 08/01/20 0935    Consent Given? Yes    Muscles Treated Head and Neck Sternocleidomastoid;Upper trapezius;Levator scapulae;Scalenes;Semispinalis capitus    Sternocleidomastoid Response Twitch response elicited;Palpable increased muscle length   Rt   Upper Trapezius Response Twitch reponse elicited;Palpable increased muscle length   Rt   Levator Scapulae Response Twitch response elicited;Palpable increased muscle length   Bilateral   Scalenes Response Twitch reponse elicited;Palpable increased muscle length   Rt   Semispinalis capitus Response  Twitch reponse elicited;Palpable increased muscle length   Rt                    PT Long Term Goals - 08/01/20 0945      PT LONG TERM GOAL #1   Title Patient will be independent with ongoing/advanced HEP for self-management at home    Status Achieved   08/01/20     PT LONG TERM GOAL #2   Title Improve posture and alignment with patient to demonstrate improved upright posture with posterior shoulder girdle engaged    Status Achieved   08/01/20     PT LONG TERM GOAL #3   Title .Patient to report >/= 50-75% reduction in frequency and intensity of weekly headaches/migraines    Status Achieved   07/18/20 - pt reporting at least 50% reduction in headache - down to only ~1/wk     PT LONG TERM GOAL #4   Title Patient to report >/= 50% improvement in ability to interact with her kids d/t improvement in HAs and neck pain    Status Achieved   07/18/20 - 75% improved             Plan - 08/01/20 1014         Clinical Impression Statement Arlee is very pleased with her progress with PT noting significant reduction in muscle tension, neck and shoulder pain and headaches. She does note some mild R-sided neck pain today which she attributes to sleeping in her daughter's bed that last few nights as her daughter grieves the death of their cat - addressed today with manual therapy including DN with good relief noted. Cervical ROM now WNL and B shoulder strength now 5/5. All goals met and Satrina feels ready to transition to her HEP but would like to remain on hold for 30 days in the event that any issues arise.    Comorbidities migraines, kidney stones, HLD, depression, asthma, anxiety     Rehab Potential Good     PT Frequency 2x / week     PT Duration 6 weeks     PT Treatment/Interventions ADLs/Self Care Home Management;Cryotherapy;Electrical Stimulation;Iontophoresis 80m/ml Dexamethasone;Moist Heat;Traction;Ultrasound;Functional mobility training;Therapeutic activities;Therapeutic  exercise;Neuromuscular re-education;Patient/family education;Manual techniques;Passive range of motion;Dry needling;Taping;Spinal Manipulations     PT Next Visit Plan 30-day hold    Consulted  and Agree with Plan of Care Patient           Patient will benefit from skilled therapeutic intervention in order to improve the following deficits and impairments:     Visit Diagnosis: Cervicalgia  Chronic tension-type headache, intractable  Other muscle spasm  Abnormal posture     Problem List Patient Active Problem List   Diagnosis Date Noted  . Carpal tunnel syndrome of left wrist 12/26/2018  . Chronic migraine without aura without status migrainosus, not intractable 10/09/2018  . Gestational diabetes mellitus, class A2 07/15/2013    Percival Spanish, PT, MPT 08/01/2020, 1:35 PM  [facility] [address]  [place], [place], [address] Phone: [phone]   Fax:  [phone]  Name: Rorie Delmore Quesnel MRN: [id number] Date of Birth: February 12, 1986    PHYSICAL THERAPY DISCHARGE SUMMARY  Visits from Start of Care: 4  Current functional level related to goals / functional outcomes:   Refer to above clinical impression for status as of last visit on 08/01/2020. Patient was placed on hold for 30 days and has not needed to return to PT, therefore will proceed with discharge from PT for this episode.   Remaining deficits:   As above.    Education / Equipment:   HEP  Plan: Patient agrees to discharge.  Patient goals were met. Patient is being discharged due to meeting the stated rehab goals.  ?????     Percival Spanish, PT, MPT 10/02/20, 11:47 AM  [facility] [address]  [place], [place], [address] Phone: [phone]   Fax:  [phone]

## 2020-08-07 ENCOUNTER — Other Ambulatory Visit: Payer: Self-pay | Admitting: Adult Health

## 2020-08-07 MED FILL — tiZANidine HCL 4 MG TABS: 4 | 15 days supply | Qty: 60 | Fill #3

## 2020-08-08 ENCOUNTER — Other Ambulatory Visit: Payer: Self-pay | Admitting: Adult Health

## 2020-08-08 MED FILL — RIZATRIPTAN 10 MG ODT: 10 | 30 days supply | Qty: 9 | Fill #0

## 2020-08-09 ENCOUNTER — Other Ambulatory Visit (HOSPITAL_COMMUNITY): Payer: Self-pay | Admitting: Internal Medicine

## 2020-08-09 MED FILL — INDAPAMIDE 1.25 MG TABS: 1.25 | 90 days supply | Qty: 90 | Fill #0

## 2020-09-11 ENCOUNTER — Other Ambulatory Visit: Payer: Self-pay | Admitting: Obstetrics & Gynecology

## 2020-09-12 ENCOUNTER — Encounter (HOSPITAL_BASED_OUTPATIENT_CLINIC_OR_DEPARTMENT_OTHER): Payer: Self-pay

## 2020-09-17 ENCOUNTER — Other Ambulatory Visit: Payer: Self-pay | Admitting: Obstetrics & Gynecology

## 2020-09-17 ENCOUNTER — Other Ambulatory Visit (HOSPITAL_COMMUNITY): Payer: Self-pay

## 2020-09-19 ENCOUNTER — Other Ambulatory Visit (HOSPITAL_COMMUNITY): Payer: Self-pay

## 2020-09-19 ENCOUNTER — Other Ambulatory Visit (HOSPITAL_BASED_OUTPATIENT_CLINIC_OR_DEPARTMENT_OTHER): Payer: Self-pay | Admitting: Obstetrics & Gynecology

## 2020-09-19 DIAGNOSIS — R151 Fecal smearing: Secondary | ICD-10-CM

## 2020-09-19 MED ORDER — CITALOPRAM HYDROBROMIDE 40 MG PO TABS
ORAL_TABLET | Freq: Every day | ORAL | 1 refills | Status: DC
  Filled 2020-09-19: qty 90, 90d supply, fill #0
  Filled 2020-12-25: qty 90, 90d supply, fill #1

## 2020-09-24 ENCOUNTER — Other Ambulatory Visit (HOSPITAL_COMMUNITY): Payer: Self-pay

## 2020-09-25 ENCOUNTER — Other Ambulatory Visit (HOSPITAL_COMMUNITY): Payer: Self-pay

## 2020-09-25 MED ORDER — ALPRAZOLAM 0.5 MG PO TABS
ORAL_TABLET | ORAL | 0 refills | Status: DC
  Filled 2020-09-25: qty 30, 30d supply, fill #0

## 2020-09-28 ENCOUNTER — Other Ambulatory Visit (HOSPITAL_COMMUNITY): Payer: Self-pay

## 2020-10-12 ENCOUNTER — Other Ambulatory Visit: Payer: Self-pay | Admitting: Adult Health

## 2020-10-12 ENCOUNTER — Other Ambulatory Visit (HOSPITAL_COMMUNITY): Payer: Self-pay

## 2020-10-12 MED FILL — Erenumab-aooe Subcutaneous Soln Auto-Injector 140 MG/ML: SUBCUTANEOUS | 30 days supply | Qty: 1 | Fill #0 | Status: AC

## 2020-10-12 MED FILL — Gabapentin Cap 300 MG: ORAL | 90 days supply | Qty: 180 | Fill #0 | Status: AC

## 2020-10-13 ENCOUNTER — Other Ambulatory Visit (HOSPITAL_COMMUNITY): Payer: Self-pay

## 2020-10-15 ENCOUNTER — Other Ambulatory Visit (HOSPITAL_COMMUNITY): Payer: Self-pay

## 2020-10-15 DIAGNOSIS — M47892 Other spondylosis, cervical region: Secondary | ICD-10-CM | POA: Diagnosis not present

## 2020-10-15 DIAGNOSIS — G4486 Cervicogenic headache: Secondary | ICD-10-CM | POA: Diagnosis not present

## 2020-10-15 DIAGNOSIS — Z6839 Body mass index (BMI) 39.0-39.9, adult: Secondary | ICD-10-CM | POA: Diagnosis not present

## 2020-10-15 DIAGNOSIS — M542 Cervicalgia: Secondary | ICD-10-CM | POA: Diagnosis not present

## 2020-10-16 ENCOUNTER — Other Ambulatory Visit (HOSPITAL_COMMUNITY): Payer: Self-pay

## 2020-10-16 ENCOUNTER — Other Ambulatory Visit: Payer: Self-pay | Admitting: Adult Health

## 2020-10-16 DIAGNOSIS — S3993XA Unspecified injury of pelvis, initial encounter: Secondary | ICD-10-CM | POA: Diagnosis not present

## 2020-10-16 MED ORDER — BACLOFEN 10 MG PO TABS
ORAL_TABLET | ORAL | 1 refills | Status: DC
  Filled 2020-10-16: qty 15, 15d supply, fill #0

## 2020-11-05 ENCOUNTER — Ambulatory Visit: Payer: 59 | Admitting: Adult Health

## 2020-11-05 ENCOUNTER — Other Ambulatory Visit: Payer: Self-pay

## 2020-11-05 ENCOUNTER — Encounter: Payer: Self-pay | Admitting: Adult Health

## 2020-11-05 ENCOUNTER — Other Ambulatory Visit (HOSPITAL_COMMUNITY): Payer: Self-pay

## 2020-11-05 VITALS — BP 136/80 | HR 90 | Ht 62.0 in | Wt 218.0 lb

## 2020-11-05 DIAGNOSIS — Z6836 Body mass index (BMI) 36.0-36.9, adult: Secondary | ICD-10-CM | POA: Diagnosis not present

## 2020-11-05 DIAGNOSIS — G43709 Chronic migraine without aura, not intractable, without status migrainosus: Secondary | ICD-10-CM | POA: Diagnosis not present

## 2020-11-05 DIAGNOSIS — E119 Type 2 diabetes mellitus without complications: Secondary | ICD-10-CM | POA: Diagnosis not present

## 2020-11-05 DIAGNOSIS — J302 Other seasonal allergic rhinitis: Secondary | ICD-10-CM | POA: Diagnosis not present

## 2020-11-05 DIAGNOSIS — J4520 Mild intermittent asthma, uncomplicated: Secondary | ICD-10-CM | POA: Diagnosis not present

## 2020-11-05 DIAGNOSIS — M542 Cervicalgia: Secondary | ICD-10-CM

## 2020-11-05 DIAGNOSIS — M4302 Spondylolysis, cervical region: Secondary | ICD-10-CM | POA: Diagnosis not present

## 2020-11-05 DIAGNOSIS — Z8241 Family history of sudden cardiac death: Secondary | ICD-10-CM | POA: Diagnosis not present

## 2020-11-05 DIAGNOSIS — E669 Obesity, unspecified: Secondary | ICD-10-CM | POA: Diagnosis not present

## 2020-11-05 DIAGNOSIS — G43909 Migraine, unspecified, not intractable, without status migrainosus: Secondary | ICD-10-CM | POA: Diagnosis not present

## 2020-11-05 DIAGNOSIS — E781 Pure hyperglyceridemia: Secondary | ICD-10-CM | POA: Diagnosis not present

## 2020-11-05 MED ORDER — ERENUMAB-AOOE 140 MG/ML ~~LOC~~ SOAJ
SUBCUTANEOUS | 11 refills | Status: DC
  Filled 2020-11-05: qty 1, 30d supply, fill #0
  Filled 2021-01-08: qty 1, 30d supply, fill #1
  Filled 2021-02-12: qty 1, 30d supply, fill #2
  Filled 2021-03-08: qty 1, 30d supply, fill #3
  Filled 2021-04-10: qty 1, 30d supply, fill #4
  Filled 2021-06-13: qty 1, 30d supply, fill #5
  Filled 2021-07-16: qty 1, 30d supply, fill #6
  Filled 2021-09-19: qty 1, 30d supply, fill #7
  Filled 2021-10-22: qty 1, 30d supply, fill #8

## 2020-11-05 MED ORDER — RIMEGEPANT SULFATE 75 MG PO TBDP
1.0000 | ORAL_TABLET | Freq: Every day | ORAL | 5 refills | Status: DC
  Filled 2020-11-05: qty 8, 30d supply, fill #0
  Filled 2021-01-23: qty 8, 30d supply, fill #1
  Filled 2021-03-25: qty 8, 30d supply, fill #2
  Filled 2021-05-14: qty 8, 30d supply, fill #3
  Filled 2021-06-06: qty 8, 30d supply, fill #4

## 2020-11-05 MED ORDER — BACLOFEN 10 MG PO TABS
10.0000 mg | ORAL_TABLET | Freq: Every day | ORAL | 3 refills | Status: DC | PRN
  Filled 2020-11-05: qty 90, 90d supply, fill #0
  Filled 2021-06-13: qty 90, 90d supply, fill #1
  Filled 2021-09-19: qty 90, 90d supply, fill #2

## 2020-11-05 MED ORDER — METOCLOPRAMIDE HCL 10 MG PO TABS
ORAL_TABLET | ORAL | 5 refills | Status: DC
  Filled 2020-11-05: qty 90, 30d supply, fill #0
  Filled 2021-06-26: qty 90, 30d supply, fill #1

## 2020-11-05 MED ORDER — ONDANSETRON 4 MG PO TBDP
4.0000 mg | ORAL_TABLET | Freq: Four times a day (QID) | ORAL | 11 refills | Status: DC | PRN
  Filled 2020-11-05: qty 30, 8d supply, fill #0
  Filled 2021-03-25: qty 30, 8d supply, fill #1
  Filled 2021-06-13: qty 30, 8d supply, fill #2
  Filled 2021-08-13: qty 30, 8d supply, fill #3

## 2020-11-05 MED ORDER — TIZANIDINE HCL 2 MG PO TABS
2.0000 mg | ORAL_TABLET | Freq: Four times a day (QID) | ORAL | 3 refills | Status: DC | PRN
  Filled 2020-11-05: qty 90, 23d supply, fill #0
  Filled 2021-02-28: qty 90, 23d supply, fill #1

## 2020-11-05 MED ORDER — OZEMPIC (0.25 OR 0.5 MG/DOSE) 2 MG/1.5ML ~~LOC~~ SOPN
PEN_INJECTOR | SUBCUTANEOUS | 2 refills | Status: DC
  Filled 2020-11-05: qty 1.5, 28d supply, fill #0
  Filled 2020-12-13: qty 1.5, 28d supply, fill #1
  Filled 2021-02-19: qty 1.5, 28d supply, fill #2

## 2020-11-05 NOTE — Progress Notes (Addendum)
[facility]    Provider:  Dr Jaynee Eagles Requesting Provider: Tisovec, Fransico Him, MD Primary Care Provider:  Haywood Pao, MD  CC:  Chief Complaint  Patient presents with  . Follow-up    TR alone Pt is well, migraines have gotten better. Migraines do increase during cycles. The last 3 days she has had sharp pain on R side of neck that goes to ear and has caused temporary hearing lost. Has about 4 migraines a month       HPI:  Today, 11/05/2020, Abigail Frank returns for yearly migraine follow-up  Migraines have been well controlled with monthly use of Aimovig.  She has only been experiencing migraines during her monthly cycle and with increased neck pain.  Reports her PCP recently referred her to [facility] center for a bone spur at C2-3 and possible nerve ablation per patient-initial evaluation next week.  Use of Maxalt with some benefit but usually will take up to 2 hours to take effect.  Trialed Nurtec with quick resolution when able to catch early enough. Will also use Zofran and baclofen with benefit.  Will occasionally use tizadine and Reglan but does cause excessive fatigue.     History provided for reference purposes only Update 11/03/2019 JM: Abigail Frank returns for migraine follow-up.  Current migraine treatment plan includes  Prevention: Aimovig 140 mg SQ monthly, Emergent as needed: ubrelvy 50 mg as needed and baclofen; if severe unable to sleep, rizatriptan 10 mg and tizanidine 4 mg.  Zofran 4 mg as needed for nausea  Reports severe migraine in 05/2019 with typical migraine onset and location but accompanied by slurred speech, right arm heaviness and unsteadiness.  She has not previously experienced the symptoms.  Pain rated 10/10.  She ended up falling asleep after taking rizatriptan and tizanidine and after awakening 4 hours later, symptoms resolved including migraine but did continue to feel spaced out sensation which is typical for her.   She has not experienced recurrent neurological symptoms since that time.  During the month of March and April, she was experiencing migraines 3-4 times weekly lasting 4 to 6 hours without benefit of emergent medications.  She does admit to missing Aimovig injections in January and February due to home stressors.  Restarted monthly Aimovig in March and migraines have greatly improved since that time.  She does endorse ongoing bilateral neck pain and feels as though this contributes to migraine onset.  She has trialed massage in the past without great benefit.   Interval history 11/24/2018 Dr. Jaynee Eagles:  Follow up for migraines and new symptom hand pain.  Patient reports for several years she has had hand pain numbness and tingling mostly in digits 1 through 3 more so on the left but also on the right.  She wakes up in the middle of the night and has to shake her hands out.  She had an MRI of the cervical spine in 2017 due to the symptoms but did not show etiology.  It is worsening.  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  Positive Phalen's sign.  I discussed carpal tunnel syndrome with patient and we will have her in for an EMG nerve conduction study.  She is doing amazing on Aimovig she loves it continue.  Personally reviewed MRI of th cervical spine and agree with the following 2017:   IMPRESSION: Negative for central canal stenosis. Uncovertebral spurring results in some foraminal narrowing on the left appearing most notable at  C3-4 with a very mild degree of foraminal narrowing on the left at C4-5 and C5-6.   Initial consult visit 10/07/2018 Dr. Jaynee Eagles:  Abigail Frank is a 35 y.o. female here as requested by Tisovec, Fransico Him, MD for migraines. PMHx migraine. No family history that she knows of, her mother is deceased. Struggling for years. Started during her menses as a teenager and have worsened over the years. Worse with life stressors. At least 2-3 migraines. She has 25 headache days a  month. At least 12 migraine days a month that can be moderately severe or severe. Pounding/pulsating, interrupts her daily life, she has to lay down, turn off the light, nausea, no vomiting, +photo/phonophbia, Can last 24-72 hours. No medication overuse. No aura. Ongoing for over a year. Migraines starts as tension in the neck and creep up the back of her neck, pins and needles, to the right and then all over, pain behind the right eye, right side is the worse side. They can be positional. She wakes with them in the morning. Her left arm goes numb, tried a steroid pack. She follows with an orthopaedist for her neck. Positions of her head can cause the headaches. Neck pain and tightness. No other focal neurologic deficits, associated symptoms, inciting events or modifiable factors.  Meds tried: zoloft. Topamax(made her "crazy"), ubrelvy, baclofen, imitrex..   Review of Systems: Patient complains of symptoms per HPI as well as the following symptoms: Headaches.  Pertinent negatives and positives per HPI. All others negative.   Social History   Socioeconomic History  . Marital status: Married    Spouse name: Not on file  . Number of children: 2  . Years of education: Not on file  . Highest education level: Bachelor's degree (e.g., BA, AB, BS)  Occupational History  . Not on file  Tobacco Use  . Smoking status: Never Smoker  . Smokeless tobacco: Never Used  Vaping Use  . Vaping Use: Never used  Substance and Sexual Activity  . Alcohol use: Yes    Comment: on occasion a glass of wine  . Drug use: Never  . Sexual activity: Yes    Birth control/protection: OCP  Other Topics Concern  . Not on file  Social History Narrative   Lives at home with her family   Left handed   Caffeine: regular, at least 2 cups of coffee daily   Social Determinants of Health   Financial Resource Strain: Not on file  Food Insecurity: Not on file  Transportation Needs: Not on file  Physical Activity: Not on  file  Stress: Not on file  Social Connections: Not on file  Intimate Partner Violence: Not on file    Family History  Problem Relation Age of Onset  . Early death Maternal Grandfather 69       ?pulmonary disease  . Pulmonary fibrosis Maternal Grandfather   . Hypertension Mother   . Hypothyroidism Mother   . Heart attack Mother   . Diabetes Mother   . Heart disease Mother        heart failure  . Dementia Mother   . Kidney disease Mother   . Hypertension Father   . Hypothyroidism Father   . Diabetes Father   . Obesity Father   . Cancer Maternal Grandmother        colon  . Dementia Maternal Grandmother   . Hypertension Maternal Grandmother   . Seizures Child        thinks its genetic, only  seizes if she hits her head,thinks its migraine related   . Migraines Neg Hx     Past Medical History:  Diagnosis Date  . Anxiety   . Asthma   . Bone spur    cervical L side; L side arm numbness intermittent   . Depression   . Diabetes in pregnancy   . Elevated cholesterol with high triglycerides   . Gestational diabetes   . Hx of varicella   . IBS (irritable bowel syndrome)   . Kidney stones    followed by Dr. Tammi Klippel  . Migraines    without aura  . Ovarian cyst   . Postpartum care following vaginal delivery (1/30) 07/15/2013  . Preterm uterine contractions 06/10/2013    Patient Active Problem List   Diagnosis Date Noted  . Carpal tunnel syndrome of left wrist 12/26/2018  . Chronic migraine without aura without status migrainosus, not intractable 10/09/2018  . Gestational diabetes mellitus, class A2 07/15/2013    Past Surgical History:  Procedure Laterality Date  . MOUTH SURGERY     wisdom teeth extractions    Current Outpatient Medications  Medication Sig Dispense Refill  . albuterol (PROVENTIL HFA;VENTOLIN HFA) 108 (90 BASE) MCG/ACT inhaler Inhale 2 puffs into the lungs every 6 (six) hours as needed for wheezing or shortness of breath.    . ALPRAZolam (XANAX) 0.5  MG tablet 1 tablet by mouth twice a day as needed for anxiety 30 days 30 tablet 0  . citalopram (CELEXA) 40 MG tablet TAKE 1 TABLET BY MOUTH DAILY 90 tablet 1  . fluticasone (FLOVENT HFA) 110 MCG/ACT inhaler Inhale 1 puff into the lungs 2 (two) times daily.    Marland Kitchen gabapentin (NEURONTIN) 300 MG capsule TAKE 1 CAPSULE BY MOUTH 2 TIMES DAILY. 180 capsule 3  . ibuprofen (ADVIL) 600 MG tablet Take 600 mg by mouth every 6 (six) hours as needed.    . indapamide (LOZOL) 1.25 MG tablet TAKE 1 TABLET BY MOUTH ONCE A DAY. 90 tablet 1  . Norethindrone Acetate-Ethinyl Estradiol (LOESTRIN) 1.5-30 MG-MCG tablet TAKE 1 TABLET BY MOUTH DAILY. 84 tablet 3  . rizatriptan (MAXALT-MLT) 10 MG disintegrating tablet DISSOLVE 1 TABLET BY MOUTH AS NEEDED FOR MIGRAINE, MAY REPEAT IN 2 HOURS IF NEEDED 9 tablet 4  . tiZANidine (ZANAFLEX) 2 MG tablet Take 1 tablet (2 mg total) by mouth every 6 (six) hours as needed for muscle spasms (migraine relief). 90 tablet 3  . baclofen (LIORESAL) 10 MG tablet Take 1 tablet (10 mg total) by mouth daily as needed for muscle spasms (acute migraine onset). 90 tablet 3  . Erenumab-aooe 140 MG/ML SOAJ INJECT 140 MG INTO THE SKIN EVERY 30 DAYS. 1 mL 11  . metoCLOPramide (REGLAN) 10 MG tablet Take 1 tablet by mouth three times a day while having migraine or nausea. 90 tablet 5  . ondansetron (ZOFRAN-ODT) 4 MG disintegrating tablet Dissolve 1 tablet (4 mg total) by mouth every 6 (six) hours as needed for nausea or for headache. 30 tablet 11  . Rimegepant Sulfate 75 MG TBDP Dissolve 1 tablet by mouth daily if needed. 10 tablet 5  . Semaglutide,0.25 or 0.5MG /DOS, (OZEMPIC, 0.25 OR 0.5 MG/DOSE,) 2 MG/1.5ML SOPN Inject subcutaneous once weekly as directed 1.5 mL 2   No current facility-administered medications for this visit.    Allergies as of 11/05/2020 - Review Complete 11/05/2020  Allergen Reaction Noted  . Bactrim [sulfamethoxazole-trimethoprim] Hives 11/06/2015  . Sulfa antibiotics  10/31/2019   . Vicodin [hydrocodone-acetaminophen] Nausea And  Vomiting 04/07/2013    Vitals: Today's Vitals   11/05/20 0825  BP: 136/80  Pulse: 90  Weight: 218 lb (98.9 kg)  Height: 5\' 2"  (1.575 m)   Body mass index is 39.87 kg/m.   Physical exam: General: well developed, well nourished, very pleasant middle-age Caucasian female, seated, in no evident distress Head: head normocephalic and atraumatic.   Neck: supple with no carotid or supraclavicular bruits Cardiovascular: regular rate and rhythm, no murmurs Musculoskeletal: no deformity Skin:  no rash/petichiae Vascular:  Normal pulses all extremities   Neurologic Exam Mental Status: Awake and fully alert. Oriented to place and time. Recent and remote memory intact. Attention span, concentration and fund of knowledge appropriate. Mood and affect appropriate.  Cranial Nerves: Pupils equal, briskly reactive to light. Extraocular movements full without nystagmus. Visual fields full to confrontation. Hearing intact. Facial sensation intact. Face, tongue, palate moves normally and symmetrically.  Motor: Normal bulk and tone. Normal strength in all tested extremity muscles. Sensory.: intact to touch , pinprick , position and vibratory sensation.  Coordination: Rapid alternating movements normal in all extremities. Finger-to-nose and heel-to-shin performed accurately bilaterally. Gait and Station: Arises from chair without difficulty. Stance is normal. Gait demonstrates normal stride length and balance Reflexes: 1+ and symmetric. Toes downgoing.      Assessment/Plan:  35 year old with chronic migraines with severe migraine 05/2019 accompanied by slurred speech, right arm heaviness and unsteadiness with symptoms shortly resolving.  Migraines greatly controlled on Aimovig but will occur 3-4 times monthly during menstrual cycle and increased neck pain   1.  Chronic migraine without aura Preventative: Continue Aimovig 140 mg SQ monthly - refill and  copay card provided  Emergent: start Nurtec 75mg  daily for acute management and during migraine cycle. Continue Maxalt as needed for more severe migraines. Continue Zofran and baclofen for moderate migraines and Reglan and tizanidine for more severe migraines. Tried/failed: Imatrex and Ubrelvy  2.  Cervicalgia Plans on evaluation with spine and scoliosis center for possible ablation (?occipital with C2-3 bone spur) Increased neck pain can contribute to onset of migraines -continue muscle relaxants as needed    Follow-up in 1 year or earlier if needed    CC:  [facility] provider: Dr. Evangeline Dakin, Fransico Him, MD     Frann Rider, AGNP-BC  [facility] [address] [place], [place] [address]  Phone [phone] Fax [phone] Note: This document was prepared with digital dictation and possible smart phrase technology. Any transcriptional errors that result from this process are unintentional.  agree with assessment and plan as stated.     Sarina Ill, MD [facility]

## 2020-11-05 NOTE — Patient Instructions (Signed)
Your Plan:  Continue Aimovig monthly for prevention  Start Nurtec as needed for acute management and use during cycle daily   Continue baclofen, reglan, zofran and tizanidine as needed     Thank you for coming to see [place] at [facility]. I hope we have been able to provide you high quality care today.  You may receive a patient satisfaction survey over the next few weeks. We would appreciate your feedback and comments so that we may continue to improve ourselves and the health of our patients.

## 2020-11-07 ENCOUNTER — Other Ambulatory Visit (HOSPITAL_COMMUNITY): Payer: Self-pay

## 2020-11-09 ENCOUNTER — Encounter: Payer: Self-pay | Admitting: Physical Therapy

## 2020-11-09 ENCOUNTER — Ambulatory Visit: Payer: 59 | Attending: Obstetrics & Gynecology | Admitting: Physical Therapy

## 2020-11-09 ENCOUNTER — Other Ambulatory Visit: Payer: Self-pay

## 2020-11-09 DIAGNOSIS — R102 Pelvic and perineal pain: Secondary | ICD-10-CM | POA: Insufficient documentation

## 2020-11-09 DIAGNOSIS — G4486 Cervicogenic headache: Secondary | ICD-10-CM | POA: Diagnosis not present

## 2020-11-09 DIAGNOSIS — R252 Cramp and spasm: Secondary | ICD-10-CM | POA: Diagnosis not present

## 2020-11-09 DIAGNOSIS — M6281 Muscle weakness (generalized): Secondary | ICD-10-CM | POA: Diagnosis not present

## 2020-11-09 DIAGNOSIS — M47812 Spondylosis without myelopathy or radiculopathy, cervical region: Secondary | ICD-10-CM | POA: Diagnosis not present

## 2020-11-09 DIAGNOSIS — M542 Cervicalgia: Secondary | ICD-10-CM | POA: Diagnosis not present

## 2020-11-09 NOTE — Therapy (Signed)
[facility] [address], [place], [place], [address] Phone: [phone]   Fax:  [phone]  Physical Therapy Evaluation  Patient Details  Name: Abigail Frank MRN: [id number] Date of Birth: [birth year] Referring Provider (PT): Dr. Hale Bogus   Encounter Date: 11/09/2020   PT End of Session - 11/09/20 1031    Visit Number 1    Date for PT Re-Evaluation 03/12/21    Authorization Type cone [employer]MR    PT Start Time 1015    PT Stop Time 1053    PT Time Calculation (min) 38 min    Activity Tolerance Patient tolerated treatment well    Behavior During Therapy [facility] for tasks assessed/performed           Past Medical History:  Diagnosis Date  . Anxiety   . Asthma   . Bone spur    cervical L side; L side arm numbness intermittent   . Depression   . Diabetes in pregnancy   . Elevated cholesterol with high triglycerides   . Gestational diabetes   . Hx of varicella   . IBS (irritable bowel syndrome)   . Kidney stones    followed by Dr. Tammi Klippel  . Migraines    without aura  . Ovarian cyst   . Postpartum care following vaginal delivery (1/30) 07/15/2013  . Preterm uterine contractions 06/10/2013    Past Surgical History:  Procedure Laterality Date  . MOUTH SURGERY     wisdom teeth extractions    There were no vitals filed for this visit.    Subjective Assessment - 11/09/20 1014    Subjective I have a rectocele. I need to try therapy. My second child I tore at grade 3. Patient reports she will have a bowel movement and then later on will have soiling on her underwear. She uses toilet paper to keep off the leaking off the underwear. Happens when she has softer stool. Pressure constant in the perineal area and worse with sitting, standing too long, and alot of exertion.    Patient Stated Goals reduce the fecal leakage, strengthen the muscles    Currently in Pain? Yes    Pain Score 7     Pain Location Other  (Comment)   perineal body   Pain Orientation Mid    Pain Descriptors / Indicators Pressure    Pain Type Chronic pain    Pain Onset More than a month ago    Pain Frequency Constant    Aggravating Factors  penile penetration vaginally, chronic heaviness    Pain Relieving Factors change positions with interocurse    Multiple Pain Sites No              [facility] PT Assessment - 11/09/20 0001      Assessment   Medical Diagnosis R15.1 Fecal soiling    Referring Provider (PT) Dr. Hale Bogus    Onset Date/Surgical Date --   chronic   Prior Therapy none      Precautions   Precautions None      Restrictions   Weight Bearing Restrictions No      Balance Screen   Has the patient fallen in the past 6 months Yes   not due to balance, cracked her tailbone 10/09/2020   How many times? 1   slipped with flip flops   Has the patient had a decrease in activity level because of a fear of falling?  No    Is the patient reluctant  to leave their home because of a fear of falling?  No      Home Ecologist residence      Prior Function   Level of Independence Independent    Vocation Part time employment    Vocation Requirements [employer] [place], standing and walking      Cognition   Overall Cognitive Status Within Functional Limits for tasks assessed      Posture/Postural Control   Posture/Postural Control No significant limitations      ROM / Strength   AROM / PROM / Strength AROM;PROM;Strength      AROM   Lumbar Extension decreased by 50%      Strength   Right Hip ABduction 3+/5    Left Hip Flexion 4/5   pain on left side of coccyx   Left Hip ABduction 4/5    Left Hip ADduction 4/5                      Objective measurements completed on examination: See above findings.     Pelvic Floor Special Questions - 11/09/20 0001    Prior Pregnancies Yes    Number of Vaginal Deliveries 2   2nd 3rd degree tear, first was first degree  tear   Diastasis Recti 0    Currently Sexually Active Yes    Is this Painful Yes   perineal area feels like pressure   Urinary Leakage No    Fecal incontinence Yes   sometimes strains to have a bowel movement   Scar Well healed;Painful;Restricted    Prolapse Posterior Wall    Pelvic Floor Internal Exam Patient confirms identification and approves PT to assess pelvic floor and treatment    Exam Type Vaginal;Rectal    Palpation tenderness located on the puborectalis, levator ani, left side of the coccyx, perineal body. Restrictions on the posterior vaginal cana    Strength weak squeeze, no lift   both vaginal and rectal   Tone increased            [facility] Adult PT Treatment/Exercise - 11/09/20 0001      Self-Care   Self-Care Other Self-Care Comments    Other Self-Care Comments  educated patient on how to massage the perineal body and posterior vaginal canal, education on vaginal moisturizers and lubricants                  PT Education - 11/09/20 1045    Education Details educated patient on massaging the perineum, educated on using coconut oil to reduce vaginal dryness; education on lubrication    Person(s) Educated Patient    Methods Explanation;Demonstration;Handout    Comprehension Verbalized understanding            PT Short Term Goals - 11/09/20 1059      PT SHORT TERM GOAL #1   Title Patient to be independent with initial HEP.    Time 3    Period Weeks    Target Date 12/07/20      PT SHORT TERM GOAL #2   Title understand how to perform manual work to reduce trigger point in the perineal body    Time 4    Period Weeks    Status New    Target Date 12/07/20      PT SHORT TERM GOAL #3   Title educated on vaginal lubricants and moisturizers    Time 4    Period Weeks    Status  New    Target Date 12/07/20             PT Long Term Goals - 11/09/20 1101      PT LONG TERM GOAL #1   Title Patient will be independent with ongoing/advanced HEP for  self-management at home    Time 4    Period Months    Status New    Target Date 03/12/21      PT LONG TERM GOAL #2   Title able to have penile penetration vaginally with pain level decreased >/= 75% due to improve tissue mobility    Time 4    Period Months    Status New    Target Date 03/12/21      PT LONG TERM GOAL #3   Title Sit with pressure in the perineal body reduce >/= 80% due to improve mobility of the tissue and improved pelvic floor strength    Time 4    Period Months    Status New    Target Date 03/12/21      PT LONG TERM GOAL #4   Title fecal soiling after bathrooming reduced >/= 80% due to increased pelvic floor strength >/= 3/5 holding for 10 seconds    Time 4    Period Months    Status New    Target Date 03/12/21                  Plan - 11/09/20 1031    Clinical Impression Statement Patient is a 35 year old female with fecal soiling for several years. When she had her last child she had a third degree tear. Since then she has a rectocele, pain with penile penetration vaginally, constant pressure in the perineal body, stool urgency, and strains with constipation. Patient will splint to have a bowel movement. Patient pelvic floor strength anally and vaginally is 2/5. She has tenderness located on the puborectalis, levator ani, obturator internist, and left side of coccyx. She has pain and restrictions on the perineal body where she tore. Patient has difficulty with achieving an orgasm since her last child was born. She had decreased sensation of the rectal area and vaginal. Patient reports she fell on 10/09/2020 and broke her coccyx bone. Patient will benefit from skilled therapy to improve pelvic floor coordination, pain with trigger points in the pelvic floor, and improve strength.    Personal Factors and Comorbidities Comorbidity 1;Sex    Comorbidities third degree tear with last child    Examination-Activity Limitations Continence;Toileting;Sit;Locomotion  Level    Stability/Clinical Decision Making Stable/Uncomplicated    Clinical Decision Making Low    Rehab Potential Excellent    PT Frequency 1x / week    PT Duration Other (comment)   4 months   PT Treatment/Interventions ADLs/Self Care Home Management;Biofeedback;Cryotherapy;Electrical Stimulation;Moist Heat;Ultrasound;Therapeutic activities;Therapeutic exercise;Neuromuscular re-education;Patient/family education;Manual techniques;Scar mobilization;Dry needling;Spinal Manipulations    PT Next Visit Plan manual work to the pelvic floor and perineal body, bulging the pelvic floor, abdominal contraction    Consulted and Agree with Plan of Care Patient           Patient will benefit from skilled therapeutic intervention in order to improve the following deficits and impairments:  Decreased coordination,Decreased range of motion,Increased fascial restricitons,Decreased activity tolerance,Decreased endurance,Increased muscle spasms,Pain,Decreased scar mobility,Decreased mobility,Decreased strength  Visit Diagnosis: Muscle weakness (generalized) - Plan: PT plan of care cert/re-cert  Cramp and spasm - Plan: PT plan of care cert/re-cert  Pelvic pain - Plan: PT plan of  care cert/re-cert     Problem List Patient Active Problem List   Diagnosis Date Noted  . Carpal tunnel syndrome of left wrist 12/26/2018  . Chronic migraine without aura without status migrainosus, not intractable 10/09/2018  . Gestational diabetes mellitus, class A2 07/15/2013    Earlie Counts, PT 11/09/20 11:08 AM    [facility] [address], [place], [place], [address] Phone: [phone]   Fax:  [phone]  Name: CHERIKA JESSIE MRN: [id number] Date of Birth: 03-06-1986

## 2020-11-09 NOTE — Patient Instructions (Signed)
  Lubrication . Used for intercourse to reduce friction . Avoid ones that have glycerin, warming gels, tingling gels, icing or cooling gel, scented . Avoid parabens due to a preservative similar to female sex hormone . May need to be reapplied once or several times during sexual activity . Can be applied to both partners genitals prior to vaginal penetration to minimize friction or irritation . Prevent irritation and mucosal tears that cause post coital pain and increased the risk of vaginal and urinary tract infections . Oil-based lubricants cannot be used with condoms due to breaking them down.  Least likely to irritate vaginal tissue.  . Plant based-lubes are safe . Silicone-based lubrication are thicker and last long and used for post-menopausal women  Vaginal Lubricators Here is a list of some suggested lubricators you can use for intercourse. Use the most hypoallergenic product.  You can place on you or your partner.   Slippery Stuff ( water based)  Sylk or Sliquid Natural H2O ( good  if frequent UTI's)- walmart, amazon  Sliquid organics silk-(aloe and silicone based )  [employer] (www.blossom-organics.com)- (aloe based )  Coconut oil, olive oil -not good with condoms   PJur Woman Nude- (water based) amazon  Uberlube- ( silicon) [place] has an organic one  Yes lubricant- (water based and has plant oil based similar to silicone) [employer] Platinum-Silicone, Target, [employer]reens  Olive and Bee intimate cream-  www.oliveandbee.com.au  Springer Things to avoid in lubricants are glycerin, warming gels, tingling gels, icing or cooling  gels, and scented gels.  Also avoid Vaseline. KY jelly, Replens, and Astroglide kills good bacteria(lactobacilli)  Things to avoid in the vaginal area . Do not use things to irritate the vulvar area . No lotions- see below . No soaps; can use Aveeno or Calendula  cleanser if needed. Must be gentle . No deodorants . No douches . Good to sleep without underwear to let the vaginal area to air out . No scrubbing: spread the lips to let warm water rinse over labias and pat dry  Creams that can be used on the Vulva Area  V [employer], walmart  Vital V Wild Yam Salve  Julva- [employer] Botanical Pro-Meno Wild Yam Cream  Coconut oil, olive oil  Cleo by [employer] labial moisturizer -[place],   Desert Cre[place]on Releveum ( lidocaine) or [facility] [address], [place],  [address] Phone # [phone] Fax [phone]

## 2020-11-15 ENCOUNTER — Ambulatory Visit: Payer: 59 | Admitting: Physical Therapy

## 2020-11-21 ENCOUNTER — Other Ambulatory Visit (HOSPITAL_COMMUNITY): Payer: Self-pay

## 2020-11-21 MED ORDER — ALPRAZOLAM 0.5 MG PO TABS
ORAL_TABLET | ORAL | 3 refills | Status: DC
  Filled 2020-11-21: qty 30, 30d supply, fill #0
  Filled 2021-02-26: qty 30, 30d supply, fill #1
  Filled 2021-04-17: qty 30, 30d supply, fill #2

## 2020-11-21 MED FILL — Rizatriptan Benzoate Oral Disintegrating Tab 10 MG (Base Eq): ORAL | 20 days supply | Qty: 9 | Fill #0 | Status: AC

## 2020-11-22 ENCOUNTER — Encounter: Payer: Self-pay | Admitting: Physical Therapy

## 2020-11-22 ENCOUNTER — Ambulatory Visit: Payer: 59 | Attending: Obstetrics & Gynecology | Admitting: Physical Therapy

## 2020-11-22 ENCOUNTER — Other Ambulatory Visit (HOSPITAL_COMMUNITY): Payer: Self-pay

## 2020-11-22 ENCOUNTER — Other Ambulatory Visit: Payer: Self-pay

## 2020-11-22 DIAGNOSIS — M6281 Muscle weakness (generalized): Secondary | ICD-10-CM | POA: Insufficient documentation

## 2020-11-22 DIAGNOSIS — R102 Pelvic and perineal pain: Secondary | ICD-10-CM | POA: Diagnosis not present

## 2020-11-22 DIAGNOSIS — R252 Cramp and spasm: Secondary | ICD-10-CM | POA: Insufficient documentation

## 2020-11-22 NOTE — Therapy (Signed)
[facility] [address] [place], [place], [address] Phone: [phone]   Fax:  [phone]  Physical Therapy Treatment  Patient Details  Name: Abigail Frank MRN: [id number] Date of Birth: 05/30/1986 Referring Provider (PT): Dr. Hale Bogus   Encounter Date: 11/22/2020   PT End of Session - 11/22/20 1021     Visit Number 2    Date for PT Re-Evaluation 03/12/21    Authorization Type cone UMR    PT Start Time 1020    PT Stop Time 1058    PT Time Calculation (min) 38 min    Activity Tolerance Patient tolerated treatment well    Behavior During Therapy [facility] for tasks assessed/performed             Past Medical History:  Diagnosis Date   Anxiety    Asthma    Bone spur    cervical L side; L side arm numbness intermittent    Depression    Diabetes in pregnancy    Elevated cholesterol with high triglycerides    Gestational diabetes    Hx of varicella    IBS (irritable bowel syndrome)    Kidney stones    followed by Dr. Tammi Klippel   Migraines    without aura   Ovarian cyst    Postpartum care following vaginal delivery (1/30) 07/15/2013   Preterm uterine contractions 06/10/2013    Past Surgical History:  Procedure Laterality Date   MOUTH SURGERY     wisdom teeth extractions    There were no vitals filed for this visit.   Subjective Assessment - 11/22/20 1021     Subjective The perineum is more tender. I feel like a little better. The soiling is the same. Patient has more solid stool.    Patient Stated Goals reduce the fecal leakage, strengthen the muscles    Currently in Pain? Yes    Pain Score 2     Pain Location Other (Comment)   perineal body   Pain Orientation Mid    Pain Descriptors / Indicators Pressure    Pain Type Chronic pain    Pain Onset More than a month ago    Pain Frequency Constant    Aggravating Factors  penile penetration vaginally, chronic heaviness    Pain Relieving Factors  change positions with intercourse    Multiple Pain Sites No                            Pelvic Floor Special Questions - 11/22/20 0001     Pelvic Floor Internal Exam Patient confirms identification and approves PT to assess pelvic floor and treatment    Exam Type Vaginal;Rectal    Palpation tenderness located on the perineal body               [facility] Adult PT Treatment/Exercise - 11/22/20 0001       Lumbar Exercises: Stretches   Active Hamstring Stretch Right;Left;1 rep;30 seconds    Active Hamstring Stretch Limitations sitting    Hip Flexor Stretch Right;Left;1 rep    Hip Flexor Stretch Limitations sitting    ITB Stretch Right;Left;1 rep;30 seconds    ITB Stretch Limitations standing    Piriformis Stretch Right;Left;30 seconds    Piriformis Stretch Limitations sitting    Other Lumbar Stretch Exercise happy baby      Manual Therapy   Manual Therapy Myofascial release;Internal Pelvic Floor    Myofascial Release fascial  work tot he perinela body going in all different directions    Internal Pelvic Floor one finger in the anal and other in the vaginal releasing around the perineal body through all the restrictions                    PT Education - 11/22/20 1059     Education Details Access Code: [email or web address]    Person(s) Educated Patient    Methods Explanation;Demonstration;Verbal cues;Handout    Comprehension Returned demonstration;Verbalized understanding              PT Short Term Goals - 11/09/20 1059       PT SHORT TERM GOAL #1   Title Patient to be independent with initial HEP.    Time 3    Period Weeks    Target Date 12/07/20      PT SHORT TERM GOAL #2   Title understand how to perform manual work to reduce trigger point in the perineal body    Time 4    Period Weeks    Status New    Target Date 12/07/20      PT SHORT TERM GOAL #3   Title educated on vaginal lubricants and moisturizers    Time 4    Period Weeks    Status  New    Target Date 12/07/20               PT Long Term Goals - 11/09/20 1101       PT LONG TERM GOAL #1   Title Patient will be independent with ongoing/advanced HEP for self-management at home    Time 4    Period Months    Status New    Target Date 03/12/21      PT LONG TERM GOAL #2   Title able to have penile penetration vaginally with pain level decreased >/= 75% due to improve tissue mobility    Time 4    Period Months    Status New    Target Date 03/12/21      PT LONG TERM GOAL #3   Title Sit with pressure in the perineal body reduce >/= 80% due to improve mobility of the tissue and improved pelvic floor strength    Time 4    Period Months    Status New    Target Date 03/12/21      PT LONG TERM GOAL #4   Title fecal soiling after bathrooming reduced >/= 80% due to increased pelvic floor strength >/= 3/5 holding for 10 seconds    Time 4    Period Months    Status New    Target Date 03/12/21                   Plan - 11/22/20 1036     Clinical Impression Statement Patient reports her pain with intercourse is 30% better and able to do different positions. The heaviness has not changed. After manual work the patient had increased mobility of the perinal body and reduction of pain. Patient has learned stretches and doing well. Patient will benefit from skilled therapy to improve pelvic floor coordination, pain with trigger points in the pelvic floor and improve strength.    Personal Factors and Comorbidities Comorbidity 1;Sex    Comorbidities third degree tear with last child    Examination-Activity Limitations Continence;Toileting;Sit;Locomotion Level    Stability/Clinical Decision Making Stable/Uncomplicated    Rehab Potential Excellent    PT Frequency 1x /  week    PT Duration Other (comment)   4 months   PT Treatment/Interventions ADLs/Self Care Home Management;Biofeedback;Cryotherapy;Electrical Stimulation;Moist Heat;Ultrasound;Therapeutic  activities;Therapeutic exercise;Neuromuscular re-education;Patient/family education;Manual techniques;Scar mobilization;Dry needling;Spinal Manipulations    PT Next Visit Plan manual work to the pelvic floor and perineal body, bulging the pelvic floor, abdominal contraction, work on the coccyx    PT Home Exercise Plan Access Code: [email or web address]    Recommended Other Services MD signed initial eval    Consulted and Agree with Plan of Care Patient             Patient will benefit from skilled therapeutic intervention in order to improve the following deficits and impairments:  Decreased coordination, Decreased range of motion, Increased fascial restricitons, Decreased activity tolerance, Decreased endurance, Increased muscle spasms, Pain, Decreased scar mobility, Decreased mobility, Decreased strength  Visit Diagnosis: Muscle weakness (generalized)  Cramp and spasm  Pelvic pain     Problem List Patient Active Problem List   Diagnosis Date Noted   Carpal tunnel syndrome of left wrist 12/26/2018   Chronic migraine without aura without status migrainosus, not intractable 10/09/2018   Gestational diabetes mellitus, class A2 07/15/2013    Earlie Counts, PT 11/22/20 11:47 AM   [place] [facility] [address] [place], [place], [address] Phone: [phone]   Fax:  [phone]  Name: QUINTAVIA ROGSTAD MRN: [id number] Date of Birth: 1985-10-16

## 2020-11-22 NOTE — Patient Instructions (Signed)
Access Code: [email or web address] URL: [email or web address] Date: 11/22/2020 Prepared by: Earlie Counts  Exercises Seated Hamstring Stretch - 1 x daily - 7 x weekly - 1 sets - 2 reps - 30 sec hold Seated Hip Adductor Stretch - 1 x daily - 7 x weekly - 1 sets - 2 reps - 30 sec hold Seated Piriformis Stretch with Trunk Bend - 1 x daily - 7 x weekly - 1 sets - 2 reps - 30 sec hold Seated Hip Flexor Stretch - 1 x daily - 7 x weekly - 1 sets - 2 reps - 30 sec hold Standing ITB Stretch - 1 x daily - 7 x weekly - 1 sets - 2 reps - 30 sec hold Supine Pelvic Floor Stretch - 1 x daily - 7 x weekly - 1 sets - 1 reps - 30 sec hold [facility] [address] [place], [place] [address] Phone # [phone] Fax [phone]

## 2020-11-23 ENCOUNTER — Other Ambulatory Visit (HOSPITAL_COMMUNITY): Payer: Self-pay

## 2020-12-03 ENCOUNTER — Encounter: Payer: 59 | Admitting: Physical Therapy

## 2020-12-13 ENCOUNTER — Other Ambulatory Visit (HOSPITAL_COMMUNITY): Payer: Self-pay

## 2020-12-25 ENCOUNTER — Other Ambulatory Visit (HOSPITAL_COMMUNITY): Payer: Self-pay

## 2020-12-31 ENCOUNTER — Ambulatory Visit: Payer: 59

## 2021-01-03 ENCOUNTER — Ambulatory Visit: Payer: 59 | Admitting: Physical Therapy

## 2021-01-08 ENCOUNTER — Other Ambulatory Visit (HOSPITAL_COMMUNITY): Payer: Self-pay

## 2021-01-17 ENCOUNTER — Ambulatory Visit: Payer: 59 | Admitting: Physical Therapy

## 2021-01-18 DIAGNOSIS — E781 Pure hyperglyceridemia: Secondary | ICD-10-CM | POA: Diagnosis not present

## 2021-01-18 DIAGNOSIS — Z0000 Encounter for general adult medical examination without abnormal findings: Secondary | ICD-10-CM | POA: Diagnosis not present

## 2021-01-18 DIAGNOSIS — E119 Type 2 diabetes mellitus without complications: Secondary | ICD-10-CM | POA: Diagnosis not present

## 2021-01-23 ENCOUNTER — Other Ambulatory Visit (HOSPITAL_COMMUNITY): Payer: Self-pay

## 2021-01-23 MED FILL — Rizatriptan Benzoate Oral Disintegrating Tab 10 MG (Base Eq): ORAL | 20 days supply | Qty: 9 | Fill #1 | Status: AC

## 2021-01-24 ENCOUNTER — Other Ambulatory Visit: Payer: Self-pay

## 2021-01-24 ENCOUNTER — Encounter: Payer: Self-pay | Admitting: Physical Therapy

## 2021-01-24 ENCOUNTER — Ambulatory Visit: Payer: 59 | Attending: Obstetrics & Gynecology | Admitting: Physical Therapy

## 2021-01-24 DIAGNOSIS — R102 Pelvic and perineal pain: Secondary | ICD-10-CM | POA: Diagnosis not present

## 2021-01-24 DIAGNOSIS — R252 Cramp and spasm: Secondary | ICD-10-CM | POA: Diagnosis not present

## 2021-01-24 DIAGNOSIS — M6281 Muscle weakness (generalized): Secondary | ICD-10-CM | POA: Insufficient documentation

## 2021-01-24 NOTE — Patient Instructions (Signed)
Access Code: [email or web address] URL: [email or web address] Date: 01/24/2021 Prepared by: Earlie Counts  Program Notes lay on right side have bolster between knees slide the thigh back and not move any other part of the body 10x   Exercises Seated Hamstring Stretch - 1 x daily - 7 x weekly - 1 sets - 2 reps - 30 sec hold Seated Hip Adductor Stretch - 1 x daily - 7 x weekly - 1 sets - 2 reps - 30 sec hold Seated Piriformis Stretch with Trunk Bend - 1 x daily - 7 x weekly - 1 sets - 2 reps - 30 sec hold Seated Hip Flexor Stretch - 1 x daily - 7 x weekly - 1 sets - 2 reps - 30 sec hold Standing ITB Stretch - 1 x daily - 7 x weekly - 1 sets - 2 reps - 30 sec hold Supine Pelvic Floor Stretch - 1 x daily - 7 x weekly - 1 sets - 1 reps - 30 sec hold Sidelying Reverse Clamshell - 1 x daily - 7 x weekly - 1 sets - 10 reps [facility] [address], [place], [place] [address] Phone # [phone] Fax [phone]

## 2021-01-24 NOTE — Therapy (Addendum)
[facility] [address] [place], [place], [address] Phone: [phone]   Fax:  [phone]  Physical Therapy Treatment  Patient Details  Name: Abigail Frank MRN: [id number] Date of Birth: Nov 04, 1985 Referring Provider (PT): Dr. Hale Bogus   Encounter Date: 01/24/2021   PT End of Session - 01/24/21 0852     Visit Number 3    Date for PT Re-Evaluation 03/12/21    Authorization Type [employer]    PT Start Time 0845    PT Stop Time 0925    PT Time Calculation (min) 40 min    Activity Tolerance Patient tolerated treatment well    Behavior During Therapy [facility] for tasks assessed/performed             Past Medical History:  Diagnosis Date   Anxiety    Asthma    Bone spur    cervical L side; L side arm numbness intermittent    Depression    Diabetes in pregnancy    Elevated cholesterol with high triglycerides    Gestational diabetes    Hx of varicella    IBS (irritable bowel syndrome)    Kidney stones    followed by Dr. Tammi Klippel   Migraines    without aura   Ovarian cyst    Postpartum care following vaginal delivery (1/30) 07/15/2013   Preterm uterine contractions 06/10/2013    Past Surgical History:  Procedure Laterality Date   MOUTH SURGERY     wisdom teeth extractions    There were no vitals filed for this visit.   Subjective Assessment - 01/24/21 0847     Subjective I was not able to come in due to the therapist schedule. I have been doing my stretches. The fecal leakage is 55% better.    Patient Stated Goals reduce the fecal leakage, strengthen the muscles    Currently in Pain? Yes    Pain Score 2     Pain Location Other (Comment)   perineal body   Pain Orientation Mid    Pain Descriptors / Indicators Pressure    Pain Type Chronic pain    Pain Onset More than a month ago    Pain Frequency Constant    Aggravating Factors  penile penetration vaginally, after a bowel movement; missionary     Pain Relieving Factors be on knees with intercourse    Multiple Pain Sites Yes    Pain Score 6    Pain Location Coccyx    Pain Orientation Mid    Pain Descriptors / Indicators Sharp;Shooting    Pain Type Acute pain    Pain Onset More than a month ago    Pain Frequency Intermittent    Aggravating Factors  sitting back on tailbone, going from sit to stand in the crack of the behind    Pain Relieving Factors sit forward on thighs                            Pelvic Floor Special Questions - 01/24/21 0001     Pelvic Floor Internal Exam Patient confirms identification and approves PT to assess pelvic floor and treatment    Exam Type Rectal               [facility] Adult PT Treatment/Exercise - 01/24/21 0001       Lumbar Exercises: Stretches   Piriformis Stretch Right;Left;30 seconds    Piriformis Stretch Limitations sitting  Knee/Hip Exercises: Sidelying   Clams reverse clam 10x each side    Other Sidelying Knee/Hip Exercises iliacus pull back on the left 10x      Manual Therapy   Manual Therapy Joint mobilization;Soft tissue mobilization;Internal Pelvic Floor    Joint Mobilization using a belt to mobilize the left hip joint grade 3 for distraction, inferior glide, and posterior glide while stretching into external rotation    Soft tissue mobilization along the right SI joint and moving her hip IR and ER in prone; soft tissue work to the perineal body while monitoring for pain    Internal Pelvic Floor distraction of the coccyx, manual work to the levator ani and coccygeus                    PT Education - 01/24/21 0923     Education Details Access Code: [email or web address]    Person(s) Educated Patient    Methods Explanation;Demonstration;Verbal cues    Comprehension Returned demonstration;Verbalized understanding              PT Short Term Goals - 01/24/21 0930       PT SHORT TERM GOAL #1   Title Patient to be independent with initial HEP.     Time 3    Period Weeks    Status Achieved      PT SHORT TERM GOAL #2   Title understand how to perform manual work to reduce trigger point in the perineal body    Time 4    Period Weeks    Status Achieved      PT SHORT TERM GOAL #3   Title educated on vaginal lubricants and moisturizers    Time 4    Period Weeks    Status Achieved               PT Long Term Goals - 11/09/20 1101       PT LONG TERM GOAL #1   Title Patient will be independent with ongoing/advanced HEP for self-management at home    Time 4    Period Months    Status New    Target Date 03/12/21      PT LONG TERM GOAL #2   Title able to have penile penetration vaginally with pain level decreased >/= 75% due to improve tissue mobility    Time 4    Period Months    Status New    Target Date 03/12/21      PT LONG TERM GOAL #3   Title Sit with pressure in the perineal body reduce >/= 80% due to improve mobility of the tissue and improved pelvic floor strength    Time 4    Period Months    Status New    Target Date 03/12/21      PT LONG TERM GOAL #4   Title fecal soiling after bathrooming reduced >/= 80% due to increased pelvic floor strength >/= 3/5 holding for 10 seconds    Time 4    Period Months    Status New    Target Date 03/12/21                   Plan - 01/24/21 0926     Clinical Impression Statement Patient reports her fecal leakage is 50% better. Patient has improved coccyx mobility and in correct alignment. She has improve mobility of left hip and no pain with stretches. Patient has increased mobility of the perineal body with minimal restrictions.Patient  is able to bulge her pelvic floor.  Patient is doing her HEP consistently. Patient will benefit from skilled therapy to reduce trigger points in the pelvic floor, improve pelvic floor coordination, and improve strength.    Personal Factors and Comorbidities Comorbidity 1;Sex    Comorbidities third degree tear with last child     Examination-Activity Limitations Continence;Toileting;Sit;Locomotion Level    Stability/Clinical Decision Making Stable/Uncomplicated    PT Frequency 1x / week    PT Duration Other (comment)   4 months   PT Treatment/Interventions ADLs/Self Care Home Management;Biofeedback;Cryotherapy;Electrical Stimulation;Moist Heat;Ultrasound;Therapeutic activities;Therapeutic exercise;Neuromuscular re-education;Patient/family education;Manual techniques;Scar mobilization;Dry needling;Spinal Manipulations    PT Next Visit Plan manual work to the pelvic floor and perineal body,, abdominal contraction, assess coccyx pain    PT Home Exercise Plan Access Code: [email or web address]    Consulted and Agree with Plan of Care Patient             Patient will benefit from skilled therapeutic intervention in order to improve the following deficits and impairments:  Decreased coordination, Decreased range of motion, Increased fascial restricitons, Decreased activity tolerance, Decreased endurance, Increased muscle spasms, Pain, Decreased scar mobility, Decreased mobility, Decreased strength  Visit Diagnosis: Muscle weakness (generalized)  Cramp and spasm  Pelvic pain     Problem List Patient Active Problem List   Diagnosis Date Noted   Carpal tunnel syndrome of left wrist 12/26/2018   Chronic migraine without aura without status migrainosus, not intractable 10/09/2018   Gestational diabetes mellitus, class A2 07/15/2013    Earlie Counts, PT 01/24/21 9:32 AM  [facility] [address] [place], [place], [address] Phone: [phone]   Fax:  [phone]  Name: DEMESHA BOORMAN MRN: [id number] Date of Birth: 09-07-1985  PHYSICAL THERAPY DISCHARGE SUMMARY  Visits from Start of Care: 3  Current functional level related to goals / functional outcomes: See above. Patient cancelled her last appointment due to having COVID. She has not returned  since then.    Remaining deficits: See above.    Education / Equipment: HEP   Patient agrees to discharge. Patient goals were not met. Patient is being discharged due to not returning since the last visit. Thank you for the referral. Earlie Counts, PT 03/12/21 8:20 AM

## 2021-01-25 DIAGNOSIS — E781 Pure hyperglyceridemia: Secondary | ICD-10-CM | POA: Diagnosis not present

## 2021-01-25 DIAGNOSIS — G43909 Migraine, unspecified, not intractable, without status migrainosus: Secondary | ICD-10-CM | POA: Diagnosis not present

## 2021-01-25 DIAGNOSIS — E669 Obesity, unspecified: Secondary | ICD-10-CM | POA: Diagnosis not present

## 2021-01-25 DIAGNOSIS — Z0000 Encounter for general adult medical examination without abnormal findings: Secondary | ICD-10-CM | POA: Diagnosis not present

## 2021-01-25 DIAGNOSIS — Z1331 Encounter for screening for depression: Secondary | ICD-10-CM | POA: Diagnosis not present

## 2021-01-25 DIAGNOSIS — J4520 Mild intermittent asthma, uncomplicated: Secondary | ICD-10-CM | POA: Diagnosis not present

## 2021-01-25 DIAGNOSIS — E119 Type 2 diabetes mellitus without complications: Secondary | ICD-10-CM | POA: Diagnosis not present

## 2021-01-25 DIAGNOSIS — J302 Other seasonal allergic rhinitis: Secondary | ICD-10-CM | POA: Diagnosis not present

## 2021-01-25 DIAGNOSIS — Z1389 Encounter for screening for other disorder: Secondary | ICD-10-CM | POA: Diagnosis not present

## 2021-01-25 DIAGNOSIS — M4302 Spondylolysis, cervical region: Secondary | ICD-10-CM | POA: Diagnosis not present

## 2021-01-25 DIAGNOSIS — Z8241 Family history of sudden cardiac death: Secondary | ICD-10-CM | POA: Diagnosis not present

## 2021-01-28 ENCOUNTER — Other Ambulatory Visit (HOSPITAL_COMMUNITY): Payer: Self-pay

## 2021-01-31 ENCOUNTER — Ambulatory Visit: Payer: 59 | Admitting: Physical Therapy

## 2021-01-31 ENCOUNTER — Other Ambulatory Visit (HOSPITAL_COMMUNITY): Payer: Self-pay

## 2021-01-31 MED ORDER — LAGEVRIO 200 MG PO CAPS
ORAL_CAPSULE | ORAL | 0 refills | Status: DC
  Filled 2021-01-31: qty 40, 5d supply, fill #0

## 2021-02-07 ENCOUNTER — Encounter: Payer: 59 | Admitting: Physical Therapy

## 2021-02-12 ENCOUNTER — Other Ambulatory Visit (HOSPITAL_COMMUNITY): Payer: Self-pay

## 2021-02-12 DIAGNOSIS — W5501XA Bitten by cat, initial encounter: Secondary | ICD-10-CM | POA: Diagnosis not present

## 2021-02-12 DIAGNOSIS — S61452A Open bite of left hand, initial encounter: Secondary | ICD-10-CM | POA: Diagnosis not present

## 2021-02-12 MED ORDER — AMOXICILLIN-POT CLAVULANATE 875-125 MG PO TABS
ORAL_TABLET | ORAL | 0 refills | Status: DC
  Filled 2021-02-12: qty 20, 10d supply, fill #0

## 2021-02-19 ENCOUNTER — Other Ambulatory Visit (HOSPITAL_COMMUNITY): Payer: Self-pay

## 2021-02-26 ENCOUNTER — Other Ambulatory Visit (HOSPITAL_COMMUNITY): Payer: Self-pay

## 2021-02-28 ENCOUNTER — Other Ambulatory Visit (HOSPITAL_COMMUNITY): Payer: Self-pay

## 2021-02-28 ENCOUNTER — Other Ambulatory Visit: Payer: Self-pay | Admitting: Adult Health

## 2021-03-04 ENCOUNTER — Other Ambulatory Visit (HOSPITAL_COMMUNITY): Payer: Self-pay

## 2021-03-04 MED ORDER — GABAPENTIN 300 MG PO CAPS
ORAL_CAPSULE | Freq: Two times a day (BID) | ORAL | 1 refills | Status: DC
  Filled 2021-03-04: qty 180, 90d supply, fill #0
  Filled 2021-09-19: qty 180, 90d supply, fill #1

## 2021-03-08 ENCOUNTER — Other Ambulatory Visit (HOSPITAL_COMMUNITY): Payer: Self-pay

## 2021-03-09 ENCOUNTER — Other Ambulatory Visit (HOSPITAL_COMMUNITY): Payer: Self-pay

## 2021-03-25 ENCOUNTER — Other Ambulatory Visit (HOSPITAL_COMMUNITY): Payer: Self-pay

## 2021-03-25 ENCOUNTER — Other Ambulatory Visit (HOSPITAL_BASED_OUTPATIENT_CLINIC_OR_DEPARTMENT_OTHER): Payer: Self-pay | Admitting: Obstetrics & Gynecology

## 2021-03-25 MED ORDER — CITALOPRAM HYDROBROMIDE 40 MG PO TABS
ORAL_TABLET | Freq: Every day | ORAL | 0 refills | Status: DC
  Filled 2021-03-25: qty 90, 90d supply, fill #0

## 2021-03-25 MED FILL — Rizatriptan Benzoate Oral Disintegrating Tab 10 MG (Base Eq): ORAL | 20 days supply | Qty: 9 | Fill #2 | Status: AC

## 2021-04-10 ENCOUNTER — Other Ambulatory Visit (HOSPITAL_COMMUNITY): Payer: Self-pay

## 2021-04-16 ENCOUNTER — Other Ambulatory Visit (HOSPITAL_COMMUNITY): Payer: Self-pay

## 2021-04-16 DIAGNOSIS — N202 Calculus of kidney with calculus of ureter: Secondary | ICD-10-CM | POA: Diagnosis not present

## 2021-04-16 DIAGNOSIS — R160 Hepatomegaly, not elsewhere classified: Secondary | ICD-10-CM | POA: Diagnosis not present

## 2021-04-16 DIAGNOSIS — K760 Fatty (change of) liver, not elsewhere classified: Secondary | ICD-10-CM | POA: Diagnosis not present

## 2021-04-16 DIAGNOSIS — Z87442 Personal history of urinary calculi: Secondary | ICD-10-CM | POA: Diagnosis not present

## 2021-04-16 MED ORDER — SULFAMETHOXAZOLE-TRIMETHOPRIM 800-160 MG PO TABS
ORAL_TABLET | ORAL | 0 refills | Status: DC
  Filled 2021-04-16: qty 8, 4d supply, fill #0

## 2021-04-17 ENCOUNTER — Other Ambulatory Visit (HOSPITAL_COMMUNITY): Payer: Self-pay

## 2021-04-18 ENCOUNTER — Other Ambulatory Visit (HOSPITAL_COMMUNITY): Payer: Self-pay

## 2021-04-18 MED ORDER — CEPHALEXIN 500 MG PO CAPS
ORAL_CAPSULE | ORAL | 0 refills | Status: DC
  Filled 2021-04-18: qty 12, 4d supply, fill #0

## 2021-04-24 ENCOUNTER — Other Ambulatory Visit (HOSPITAL_COMMUNITY): Payer: Self-pay

## 2021-04-24 MED ORDER — AZITHROMYCIN 250 MG PO TABS
ORAL_TABLET | ORAL | 0 refills | Status: DC
  Filled 2021-04-24: qty 6, 5d supply, fill #0

## 2021-04-24 MED ORDER — CARESTART COVID-19 HOME TEST VI KIT
PACK | 0 refills | Status: DC
  Filled 2021-04-24: qty 4, 4d supply, fill #0

## 2021-05-03 ENCOUNTER — Ambulatory Visit (HOSPITAL_BASED_OUTPATIENT_CLINIC_OR_DEPARTMENT_OTHER): Payer: 59 | Admitting: Obstetrics & Gynecology

## 2021-05-14 ENCOUNTER — Other Ambulatory Visit (HOSPITAL_COMMUNITY): Payer: Self-pay

## 2021-05-14 MED ORDER — FREESTYLE LITE TEST VI STRP
ORAL_STRIP | 5 refills | Status: DC
  Filled 2021-05-14: qty 100, 90d supply, fill #0

## 2021-05-14 MED ORDER — OZEMPIC (0.25 OR 0.5 MG/DOSE) 2 MG/1.5ML ~~LOC~~ SOPN
PEN_INJECTOR | SUBCUTANEOUS | 3 refills | Status: DC
  Filled 2021-07-30: qty 1.5, 28d supply, fill #0
  Filled 2021-09-19: qty 1.5, 28d supply, fill #1

## 2021-05-14 MED ORDER — FREESTYLE LANCETS MISC
5 refills | Status: DC
  Filled 2021-05-14: qty 100, 90d supply, fill #0
  Filled 2022-02-25: qty 100, 90d supply, fill #1

## 2021-05-14 MED ORDER — FREESTYLE LITE W/DEVICE KIT
PACK | 0 refills | Status: DC
  Filled 2021-05-14: qty 1, 30d supply, fill #0

## 2021-05-14 MED ORDER — OZEMPIC (0.25 OR 0.5 MG/DOSE) 2 MG/1.5ML ~~LOC~~ SOPN
PEN_INJECTOR | SUBCUTANEOUS | 2 refills | Status: DC
  Filled 2021-05-16: qty 1.5, 28d supply, fill #0

## 2021-05-14 MED FILL — Rizatriptan Benzoate Oral Disintegrating Tab 10 MG (Base Eq): ORAL | 20 days supply | Qty: 9 | Fill #3 | Status: AC

## 2021-05-16 ENCOUNTER — Other Ambulatory Visit (HOSPITAL_COMMUNITY): Payer: Self-pay

## 2021-05-21 ENCOUNTER — Telehealth: Payer: Self-pay | Admitting: *Deleted

## 2021-05-21 NOTE — Telephone Encounter (Signed)
Completed [employer] PA on Cover My Meds. Key: [id number].   The request has been approved. The authorization is effective for a maximum of 12 fills from 05/21/2021 to 05/20/2022, as long as the member is enrolled in their current health plan. The request was approved with a quantity restriction. This has been approved for a quantity limit of 1 with a day supply limit of 30. A written notification letter will follow with additional details.

## 2021-05-22 ENCOUNTER — Other Ambulatory Visit (HOSPITAL_COMMUNITY): Payer: Self-pay

## 2021-05-22 ENCOUNTER — Other Ambulatory Visit: Payer: Self-pay | Admitting: Adult Health

## 2021-05-22 MED ORDER — ALPRAZOLAM 0.5 MG PO TABS
ORAL_TABLET | ORAL | 3 refills | Status: DC
  Filled 2021-05-22: qty 30, 15d supply, fill #0
  Filled 2021-06-26: qty 30, 15d supply, fill #1
  Filled 2021-08-13: qty 30, 15d supply, fill #2
  Filled 2021-09-19: qty 30, 15d supply, fill #3

## 2021-05-22 MED ORDER — RIZATRIPTAN BENZOATE 10 MG PO TBDP
ORAL_TABLET | ORAL | 4 refills | Status: DC
  Filled 2021-05-22: qty 9, fill #0
  Filled 2021-08-13: qty 9, 18d supply, fill #0
  Filled 2021-09-19: qty 9, 18d supply, fill #1
  Filled 2021-10-22: qty 9, 18d supply, fill #2
  Filled 2021-11-30: qty 9, 18d supply, fill #3
  Filled 2021-12-27: qty 9, 30d supply, fill #4

## 2021-06-06 ENCOUNTER — Other Ambulatory Visit (HOSPITAL_COMMUNITY): Payer: Self-pay

## 2021-06-06 ENCOUNTER — Telehealth: Payer: Self-pay | Admitting: *Deleted

## 2021-06-06 NOTE — Telephone Encounter (Signed)
[employer] approved 06/06/21 - 06/05/2022. Quantity limit of 18 for 30 days. Approval letter faxed to pharmacy.

## 2021-06-06 NOTE — Telephone Encounter (Signed)
[employer] PA< key [id number], [id number]. Your information has been sent to [place].

## 2021-06-07 ENCOUNTER — Other Ambulatory Visit (HOSPITAL_COMMUNITY): Payer: Self-pay

## 2021-06-13 ENCOUNTER — Other Ambulatory Visit (HOSPITAL_COMMUNITY): Payer: Self-pay

## 2021-06-20 ENCOUNTER — Other Ambulatory Visit (HOSPITAL_COMMUNITY): Payer: Self-pay

## 2021-06-20 DIAGNOSIS — J4520 Mild intermittent asthma, uncomplicated: Secondary | ICD-10-CM | POA: Diagnosis not present

## 2021-06-20 DIAGNOSIS — R053 Chronic cough: Secondary | ICD-10-CM | POA: Diagnosis not present

## 2021-06-20 MED ORDER — FLUTICASONE FUROATE-VILANTEROL 200-25 MCG/ACT IN AEPB
INHALATION_SPRAY | RESPIRATORY_TRACT | 3 refills | Status: DC
  Filled 2021-06-20: qty 60, 30d supply, fill #0
  Filled 2022-05-16: qty 60, 30d supply, fill #1

## 2021-06-24 ENCOUNTER — Other Ambulatory Visit (HOSPITAL_BASED_OUTPATIENT_CLINIC_OR_DEPARTMENT_OTHER): Payer: Self-pay | Admitting: Obstetrics & Gynecology

## 2021-06-26 ENCOUNTER — Other Ambulatory Visit (HOSPITAL_COMMUNITY): Payer: Self-pay

## 2021-06-27 ENCOUNTER — Other Ambulatory Visit (HOSPITAL_COMMUNITY): Payer: Self-pay

## 2021-06-27 MED ORDER — CITALOPRAM HYDROBROMIDE 40 MG PO TABS
ORAL_TABLET | Freq: Every day | ORAL | 0 refills | Status: DC
  Filled 2021-06-27: qty 90, 90d supply, fill #0

## 2021-06-28 ENCOUNTER — Other Ambulatory Visit (HOSPITAL_COMMUNITY): Payer: Self-pay

## 2021-06-28 ENCOUNTER — Encounter (HOSPITAL_BASED_OUTPATIENT_CLINIC_OR_DEPARTMENT_OTHER): Payer: Self-pay | Admitting: Obstetrics & Gynecology

## 2021-06-28 ENCOUNTER — Other Ambulatory Visit: Payer: Self-pay

## 2021-06-28 ENCOUNTER — Ambulatory Visit (INDEPENDENT_AMBULATORY_CARE_PROVIDER_SITE_OTHER): Payer: 59 | Admitting: Obstetrics & Gynecology

## 2021-06-28 VITALS — BP 117/71 | HR 76 | Ht 63.0 in | Wt 222.0 lb

## 2021-06-28 DIAGNOSIS — F419 Anxiety disorder, unspecified: Secondary | ICD-10-CM | POA: Diagnosis not present

## 2021-06-28 DIAGNOSIS — E119 Type 2 diabetes mellitus without complications: Secondary | ICD-10-CM

## 2021-06-28 DIAGNOSIS — N816 Rectocele: Secondary | ICD-10-CM

## 2021-06-28 DIAGNOSIS — Z01419 Encounter for gynecological examination (general) (routine) without abnormal findings: Secondary | ICD-10-CM

## 2021-06-28 DIAGNOSIS — G43709 Chronic migraine without aura, not intractable, without status migrainosus: Secondary | ICD-10-CM

## 2021-06-28 DIAGNOSIS — Z8742 Personal history of other diseases of the female genital tract: Secondary | ICD-10-CM | POA: Diagnosis not present

## 2021-06-28 DIAGNOSIS — R151 Fecal smearing: Secondary | ICD-10-CM

## 2021-06-28 MED ORDER — HYDROXYZINE HCL 25 MG PO TABS
25.0000 mg | ORAL_TABLET | Freq: Every evening | ORAL | 1 refills | Status: DC | PRN
  Filled 2021-06-28: qty 30, 30d supply, fill #0
  Filled 2022-02-25: qty 30, 30d supply, fill #1

## 2021-06-28 MED ORDER — NORETHINDRONE 0.35 MG PO TABS
1.0000 | ORAL_TABLET | Freq: Every day | ORAL | 4 refills | Status: DC
Start: 2021-06-28 — End: 2022-07-03
  Filled 2021-06-28: qty 84, 84d supply, fill #0
  Filled 2021-10-22: qty 84, 84d supply, fill #1
  Filled 2022-02-25: qty 84, 84d supply, fill #2
  Filled 2022-06-02: qty 84, 84d supply, fill #3

## 2021-06-28 NOTE — Progress Notes (Signed)
36 y.o. G15P2002 Married White or Caucasian female here for annual exam.  Cycles are regular.  Not using anything for contraception.    She's had two migraines with auras.  Stopped OCPs due to increased stroke risk.  POPs discussed.  Pt would like to start this.    Reports she had Covid in the fall as well as flu in November.  She is currently on Breo.    Does have some stool leakage when she has diarrhea related to her IBS.    Patient's last menstrual period was 05/29/2021.          Sexually active: Yes.    The current method of family planning is none.    Exercising: Yes.    Doing piliates and exercising at the [facility] Smoker:  no  Health Maintenance: Pap:  10/31/2019 Negative, neg HR HPV History of abnormal Pap:  no MMG:  guidelines reviewed Colonoscopy:  guidelines reviewed Screening Labs: does with Dr. Osborne Casco   reports that she has never smoked. She has never used smokeless tobacco. She reports current alcohol use. She reports that she does not use drugs.  Past Medical History:  Diagnosis Date   Anxiety    Asthma    Bone spur    cervical L side; L side arm numbness intermittent    Depression    Diabetes in pregnancy    Elevated cholesterol with high triglycerides    Gestational diabetes    Hx of varicella    IBS (irritable bowel syndrome)    Kidney stones    followed by Dr. Tammi Klippel   Migraines    without aura   Ovarian cyst    Postpartum care following vaginal delivery (1/30) 07/15/2013   Preterm uterine contractions 06/10/2013    Past Surgical History:  Procedure Laterality Date   MOUTH SURGERY     wisdom teeth extractions    Current Outpatient Medications  Medication Sig Dispense Refill   albuterol (PROVENTIL HFA;VENTOLIN HFA) 108 (90 BASE) MCG/ACT inhaler Inhale 2 puffs into the lungs every 6 (six) hours as needed for wheezing or shortness of breath.     ALPRAZolam (XANAX) 0.5 MG tablet Take 1 tablet by mouth twice a day as needed for anxiety 30 tablet 3    baclofen (LIORESAL) 10 MG tablet Take 1 tablet (10 mg total) by mouth daily as needed for muscle spasms (acute migraine onset). 90 tablet 3   Blood Glucose Monitoring Suppl (FREESTYLE LITE) w/Device KIT Use to test blood sugars daily 1 kit 0   citalopram (CELEXA) 40 MG tablet TAKE 1 TABLET BY MOUTH DAILY 90 tablet 0   COVID-19 At Home Antigen Test (CARESTART COVID-19 HOME TEST) KIT Use as directed within package instructions. 4 each 0   Erenumab-aooe 140 MG/ML SOAJ INJECT 140 MG INTO THE SKIN EVERY 30 DAYS. 1 mL 11   fluticasone furoate-vilanterol (BREO ELLIPTA) 200-25 MCG/ACT AEPB Inhale 1 puff into the lungs once a day. 60 each 3   gabapentin (NEURONTIN) 300 MG capsule TAKE 1 CAPSULE BY MOUTH 2 TIMES DAILY. 180 capsule 1   glucose blood (FREESTYLE LITE) test strip Use to test blood sugars daily 100 each 5   hydrOXYzine (ATARAX) 25 MG tablet Take 1 tablet (25 mg total) by mouth at bedtime as needed. 30 tablet 1   ibuprofen (ADVIL) 600 MG tablet Take 600 mg by mouth every 6 (six) hours as needed.     indapamide (LOZOL) 1.25 MG tablet TAKE 1 TABLET BY MOUTH ONCE A DAY.  90 tablet 1   Lancets (FREESTYLE) lancets Use to test blood sugars daily 100 each 5   metoCLOPramide (REGLAN) 10 MG tablet Take 1 tablet by mouth three times a day while having migraine or nausea. 90 tablet 5   norethindrone (MICRONOR) 0.35 MG tablet Take 1 tablet (0.35 mg total) by mouth daily. 84 tablet 4   ondansetron (ZOFRAN-ODT) 4 MG disintegrating tablet Dissolve 1 tablet (4 mg total) by mouth every 6 (six) hours as needed for nausea or for headache. 30 tablet 11   Rimegepant Sulfate 75 MG TBDP Dissolve 1 tablet by mouth daily if needed. 10 tablet 5   rizatriptan (MAXALT-MLT) 10 MG disintegrating tablet DISSOLVE 1 TABLET BY MOUTH AS NEEDED FOR MIGRAINE, MAY REPEAT IN 2 HOURS IF NEEDED 9 tablet 4   Semaglutide,0.25 or 0.5MG/DOS, (OZEMPIC, 0.25 OR 0.5 MG/DOSE,) 2 MG/1.5ML SOPN Inject subcutaneously once weekly as directed 1.5 mL 3    Semaglutide,0.25 or 0.5MG/DOS, (OZEMPIC, 0.25 OR 0.5 MG/DOSE,) 2 MG/1.5ML SOPN Inject under the skin once weekly as directed 1.5 mL 2   tiZANidine (ZANAFLEX) 2 MG tablet Take 1 tablet (2 mg total) by mouth every 6 (six) hours as needed for muscle spasms (migraine relief). 90 tablet 3   fluticasone (FLOVENT HFA) 110 MCG/ACT inhaler Inhale 1 puff into the lungs 2 (two) times daily. (Patient not taking: Reported on 06/28/2021)     No current facility-administered medications for this visit.    Family History  Problem Relation Age of Onset   Early death Maternal Grandfather 37       ?pulmonary disease   Pulmonary fibrosis Maternal Grandfather    Hypertension Mother    Hypothyroidism Mother    Heart attack Mother    Diabetes Mother    Heart disease Mother        heart failure   Dementia Mother    Kidney disease Mother    Hypertension Father    Hypothyroidism Father    Diabetes Father    Obesity Father    Cancer Maternal Grandmother        colon   Dementia Maternal Grandmother    Hypertension Maternal Grandmother    Seizures Child        thinks its genetic, only seizes if she hits her head,thinks its migraine related    Migraines Neg Hx     Review of Systems  All other systems reviewed and are negative.  Exam:   BP 117/71 (BP Location: Left Arm, Patient Position: Sitting, Cuff Size: Large)    Pulse 76    Ht 5' 3"  (1.6 m) Comment: reported   Wt 222 lb (100.7 kg)    LMP 05/29/2021    BMI 39.33 kg/m   Height: 5' 3"  (160 cm) (reported)  General appearance: alert, cooperative and appears stated age Head: Normocephalic, without obvious abnormality, atraumatic Neck: no adenopathy, supple, symmetrical, trachea midline and thyroid normal to inspection and palpation Lungs: clear to auscultation bilaterally Breasts: normal appearance, no masses or tenderness Heart: regular rate and rhythm Abdomen: soft, non-tender; bowel sounds normal; no masses,  no organomegaly Extremities:  extremities normal, atraumatic, no cyanosis or edema Skin: Skin color, texture, turgor normal. No rashes or lesions Lymph nodes: Cervical, supraclavicular, and axillary nodes normal. No abnormal inguinal nodes palpated Neurologic: Grossly normal  Pelvic: External genitalia:  no lesions              Urethra:  normal appearing urethra with no masses, tenderness or lesions  Bartholins and Skenes: normal                 Vagina: normal appearing vagina with normal color and no discharge, no lesions              Cervix: no lesions              Pap taken: No. Bimanual Exam:  Uterus:  normal size, contour, position, consistency, mobility, non-tender              Adnexa: normal adnexa and no mass, fullness, tenderness               Rectovaginal: Confirms, good anal tone but rectocele noted               Anus:  normal sphincter tone, no lesions  Chaperone, Octaviano Batty, CMA, was present for exam.  Assessment/Plan: 1. Well woman exam with routine gynecological exam - pap negative 10/31/2019 with neg HR HPV.  Not indicated today. - breast and colon cancer screening guidelines reviewed - lab work is done with Dr. Osborne Casco - vaccines reviewed/updated  2. Anxiety - hydrOXYzine (ATARAX) 25 MG tablet; Take 1 tablet (25 mg total) by mouth at bedtime as needed.  Dispense: 30 tablet; Refill: 1  3. Controlled type 2 diabetes mellitus without complication, without long-term current use of insulin ([place])  4. Fecal smearing - Ambulatory referral to Urogynecology  5. History of menorrhagia - Combination OCPs stopped due to migraines.   - will try POPs:  norethindrone (MICRONOR) 0.35 MG tablet; Take 1 tablet by mouth daily.  Dispense: 84 tablet; Refill: 4  6. Chronic migraine without aura without status migrainosus, not intractable  7. Rectocele - Ambulatory referral to Urogynecology

## 2021-07-01 ENCOUNTER — Encounter (HOSPITAL_BASED_OUTPATIENT_CLINIC_OR_DEPARTMENT_OTHER): Payer: Self-pay | Admitting: Obstetrics & Gynecology

## 2021-07-01 DIAGNOSIS — F419 Anxiety disorder, unspecified: Secondary | ICD-10-CM | POA: Insufficient documentation

## 2021-07-01 DIAGNOSIS — R151 Fecal smearing: Secondary | ICD-10-CM | POA: Insufficient documentation

## 2021-07-01 DIAGNOSIS — Z8742 Personal history of other diseases of the female genital tract: Secondary | ICD-10-CM | POA: Insufficient documentation

## 2021-07-01 DIAGNOSIS — E669 Obesity, unspecified: Secondary | ICD-10-CM | POA: Insufficient documentation

## 2021-07-16 ENCOUNTER — Other Ambulatory Visit (HOSPITAL_COMMUNITY): Payer: Self-pay

## 2021-07-30 ENCOUNTER — Other Ambulatory Visit (HOSPITAL_COMMUNITY): Payer: Self-pay

## 2021-08-13 ENCOUNTER — Other Ambulatory Visit (HOSPITAL_COMMUNITY): Payer: Self-pay

## 2021-08-14 ENCOUNTER — Other Ambulatory Visit (HOSPITAL_COMMUNITY): Payer: Self-pay

## 2021-08-22 DIAGNOSIS — E781 Pure hyperglyceridemia: Secondary | ICD-10-CM | POA: Diagnosis not present

## 2021-08-22 DIAGNOSIS — Z1339 Encounter for screening examination for other mental health and behavioral disorders: Secondary | ICD-10-CM | POA: Diagnosis not present

## 2021-08-22 DIAGNOSIS — E669 Obesity, unspecified: Secondary | ICD-10-CM | POA: Diagnosis not present

## 2021-08-22 DIAGNOSIS — M4302 Spondylolysis, cervical region: Secondary | ICD-10-CM | POA: Diagnosis not present

## 2021-08-22 DIAGNOSIS — G43909 Migraine, unspecified, not intractable, without status migrainosus: Secondary | ICD-10-CM | POA: Diagnosis not present

## 2021-08-22 DIAGNOSIS — E119 Type 2 diabetes mellitus without complications: Secondary | ICD-10-CM | POA: Diagnosis not present

## 2021-08-22 DIAGNOSIS — Z1331 Encounter for screening for depression: Secondary | ICD-10-CM | POA: Diagnosis not present

## 2021-08-22 DIAGNOSIS — J4520 Mild intermittent asthma, uncomplicated: Secondary | ICD-10-CM | POA: Diagnosis not present

## 2021-08-22 DIAGNOSIS — Z8241 Family history of sudden cardiac death: Secondary | ICD-10-CM | POA: Diagnosis not present

## 2021-08-28 ENCOUNTER — Ambulatory Visit: Payer: 59 | Admitting: Obstetrics and Gynecology

## 2021-08-28 ENCOUNTER — Encounter: Payer: Self-pay | Admitting: Obstetrics and Gynecology

## 2021-08-28 ENCOUNTER — Other Ambulatory Visit: Payer: Self-pay

## 2021-08-28 VITALS — BP 124/85 | HR 93 | Ht 62.0 in | Wt 215.0 lb

## 2021-08-28 DIAGNOSIS — R195 Other fecal abnormalities: Secondary | ICD-10-CM

## 2021-08-28 DIAGNOSIS — R159 Full incontinence of feces: Secondary | ICD-10-CM

## 2021-08-28 DIAGNOSIS — M62838 Other muscle spasm: Secondary | ICD-10-CM

## 2021-08-28 DIAGNOSIS — R350 Frequency of micturition: Secondary | ICD-10-CM | POA: Diagnosis not present

## 2021-08-28 LAB — POCT URINALYSIS DIPSTICK
Appearance: ABNORMAL
Bilirubin, UA: NEGATIVE
Blood, UA: NEGATIVE
Glucose, UA: POSITIVE — AB
Ketones, UA: NEGATIVE
Leukocytes, UA: NEGATIVE
Nitrite, UA: NEGATIVE
Protein, UA: NEGATIVE
Spec Grav, UA: >=1.03 — AB (ref 1.010–1.025)
Urobilinogen, UA: 0.2 E.U./dL
pH, UA: 6 (ref 5.0–8.0)

## 2021-08-28 NOTE — Progress Notes (Signed)
[name] ?New Patient Evaluation and Consultation ? ?Referring Provider: Megan Salon, MD ?PCP: Haywood Pao, MD ?Date of Service: 08/28/2021 ? ?SUBJECTIVE ?Chief Complaint: New Patient (Initial Visit) Abigail Frank is a 36 y.o. female complains of stool incontinence and problems starting urination.) ? ?History of Present Illness: Abigail Frank is a 36 y.o. White or Caucasian female seen in consultation at the request of Dr. Sabra Heck for evaluation of prolapse and bowel incontinence.   ? ?Review of records from Dr Sabra Heck significant for: ?Has stool leakage when she has diarrhea related to her IBS. Posterior vaginal wall weakness noted on exam.  ? ?Urinary Symptoms: ?Leaks urine with cough/ sneeze ?Leaks 1 time(s) per day.  ?Pad use: none ?She is not bothered by her UI symptoms. ? ?Day time voids 4.  Nocturia: 0 times per night to void. ?Voiding dysfunction: she empties her bladder well.  ?does not use a catheter to empty bladder.  ?When urinating, she feels a weak stream, difficulty starting urine stream, and dribbling after finishing ? ? ?UTIs:  0  UTI's in the last year.   ?Reports history of kidney or bladder stones ? ?Pelvic Organ Prolapse Symptoms:                  ?She Denies a feeling of a bulge the vaginal area. \ ? ?Bowel Symptom: ?Bowel movements: 2+ time(s) per day ?Stool consistency: hard or loose- diagnosed with IBS as a child. Has loose stools (Bristol Type 7) after eating and cannot control it.  ?Hard stools get stuck and are painfull- about once a week has to disimpact.  ?Pushes up on perineum to get out more stool.  ?Straining: yes, sometimes ?Splinting: yes.  ?Incomplete evacuation: yes.  ?She Admits to accidental bowel leakage / fecal incontinence- has been going on for about a year ? Occurs: 1 time(s) per day ? Consistency with leakage: liquid ?Had some improvement with pelvic PT but ended up canceling sessions due to covid illness and did not restart.  ?Bowel  regimen: diet and fiber- taking metamucil every day, helps a little with the loose stool.  ?Last colonoscopy: never  ? ?Sexual Function ?Sexually active: yes.  ?Sexual orientation:  heterosexual ?Pain with sex: Yes, at the vaginal opening, deep in the pelvis ?Occasionally uses lidocaine jelly ? ?Pelvic Pain ?Admits to pelvic pressure ?Location: at the perineum ?Pain occurs: daily but intermittent ?Prior pain treatment: pelvic floor PT ?Worsened by: sitting/ squatting.  ?Has been this way since she had her second child 8 years ago.  ? ? ?Past Medical History:  ?Past Medical History:  ?Diagnosis Date  ? Anxiety   ? Asthma   ? Bone spur   ? cervical L side; L side arm numbness intermittent   ? Depression   ? Diabetes in pregnancy   ? Diabetes type 2, controlled ([place])   ? Elevated cholesterol with high triglycerides   ? Gestational diabetes   ? Hx of varicella   ? IBS (irritable bowel syndrome)   ? Kidney stones   ? followed by Dr. Tammi Klippel  ? Migraines   ? without aura  ? Ovarian cyst   ? Postpartum care following vaginal delivery (1/30) 07/15/2013  ? Preterm uterine contractions 06/10/2013  ? ? ? ?Past Surgical History:   ?Past Surgical History:  ?Procedure Laterality Date  ? MOUTH SURGERY    ? wisdom teeth extractions  ? ? ? ?Past OB/GYN History: ?OB History   ? ? Gravida  ?  2  ? Para  ?2  ? Term  ?2  ? Preterm  ?0  ? AB  ?0  ? Living  ?2  ?  ? ? SAB  ?0  ? IAB  ?0  ? Ectopic  ?0  ? Multiple  ?0  ? Live Births  ?2  ?   ?  ?  ? ? ?Vaginal deliveries: 2 ?History of 3rd degree laceration ?LMP 08/07/21 ?Contraception: OCPs. ?Last pap smear was 10/2019- negative.   ? ? ?Medications: She has a current medication list which includes the following prescription(s): albuterol, alprazolam, baclofen, freestyle lite, citalopram, carestart covid-19 home test, erenumab-aooe, fluticasone, fluticasone furoate-vilanterol, gabapentin, freestyle lite, hydroxyzine, ibuprofen, freestyle, metoclopramide, norethindrone, ondansetron, rimegepant  sulfate, rizatriptan, ozempic (0.25 or 0.5 mg/dose), ozempic (0.25 or 0.5 mg/dose), tizanidine, and indapamide.  ? ?Allergies: Patient is allergic to bactrim [sulfamethoxazole-trimethoprim], sulfa antibiotics, and vicodin [hydrocodone-acetaminophen].  ? ?Social History:  ?Social History  ? ?Tobacco Use  ? Smoking status: Never  ? Smokeless tobacco: Never  ?Vaping Use  ? Vaping Use: Never used  ?Substance Use Topics  ? Alcohol use: Yes  ?  Comment: on occasion a glass of wine  ? Drug use: Never  ? ? ?Relationship status: married ?She lives with husband.   ?She is employed as an Therapist, sports. ?Regular exercise: Yes: 1 hr cardio and weight training, 3x week ?History of abuse: No ? ?Family History:   ?Family History  ?Problem Relation Age of Onset  ? Early death Maternal Grandfather 20  ?     ?pulmonary disease  ? Pulmonary fibrosis Maternal Grandfather   ? Hypertension Mother   ? Hypothyroidism Mother   ? Heart attack Mother   ? Diabetes Mother   ? Heart disease Mother   ?     heart failure  ? Dementia Mother   ? Kidney disease Mother   ? Hypertension Father   ? Hypothyroidism Father   ? Diabetes Father   ? Obesity Father   ? Cancer Maternal Grandmother   ?     colon  ? Dementia Maternal Grandmother   ? Hypertension Maternal Grandmother   ? Seizures Child   ?     thinks its genetic, only seizes if she hits her head,thinks its migraine related   ? Migraines Neg Hx   ? ? ? ?Review of Systems: Review of Systems  ?Constitutional:  Negative for fever, malaise/fatigue and weight loss.  ?Respiratory:  Negative for cough, shortness of breath and wheezing.   ?Cardiovascular:  Negative for chest pain, palpitations and leg swelling.  ?Gastrointestinal:  Negative for abdominal pain and blood in stool.  ?Genitourinary:  Negative for dysuria.  ?Musculoskeletal:  Negative for myalgias.  ?Skin:  Negative for rash.  ?Neurological:  Positive for headaches. Negative for dizziness.  ?Endo/Heme/Allergies:  Does not bruise/bleed easily.  ?     +hot  flashes  ?Psychiatric/Behavioral:  Positive for depression. The patient is nervous/anxious.   ? ? ?OBJECTIVE ?Physical Exam: ?Vitals:  ? 08/28/21 0853  ?BP: 124/85  ?Pulse: 93  ?Weight: 215 lb (97.5 kg)  ?Height: '5\' 2"'$  (1.575 m)  ? ? ?Physical Exam ?Constitutional:   ?   General: She is not in acute distress. ?   Appearance: She is obese.  ?Pulmonary:  ?   Effort: Pulmonary effort is normal.  ?Abdominal:  ?   General: There is no distension.  ?   Palpations: Abdomen is soft.  ?   Tenderness: There is no abdominal tenderness. There  is no rebound.  ?Musculoskeletal:     ?   General: No swelling. Normal range of motion.  ?Skin: ?   General: Skin is warm and dry.  ?   Findings: No rash.  ?Neurological:  ?   Mental Status: She is alert and oriented to person, place, and time.  ?Psychiatric:     ?   Mood and Affect: Mood normal.     ?   Behavior: Behavior normal.  ? ? ? ?GU / Detailed Urogynecologic Evaluation:  ?Pelvic Exam: Normal external female genitalia; Bartholin's and Skene's glands normal in appearance; urethral meatus normal in appearance, no urethral masses or discharge.  ? ?CST: negative ? ?Tenderness on palpation at introitus, some palpable scar tissue present.  ?Speculum exam reveals normal vaginal mucosa without atrophy. Cervix normal appearance. Uterus normal single, nontender. Adnexa no mass, fullness, tenderness.   ? ? ?Pelvic floor strength II/V, puborectalis III/V external anal sphincter III/V ? ?Pelvic floor musculature: Right levator tender, Right obturator tender, Left levator tender, Left obturator tender ? ?POP-Q:  ? ?POP-Q ? ?-2  ?                                          Aa   ?-2 ?                                          Ba  ?-8  ?                                            C  ? ?4  ?                                          Gh  ?4  ?                                          Pb  ?12  ?                                          tvl  ? ?-2  ?                                          Ap  ?-2  ?                                           Bp  ?-10  ?  D  ? ? ? ?Rectal Exam:  ?Normal sphincter tone, small distal rectocele, enterocoele not present, no rectal mas

## 2021-09-19 ENCOUNTER — Other Ambulatory Visit (HOSPITAL_BASED_OUTPATIENT_CLINIC_OR_DEPARTMENT_OTHER): Payer: Self-pay | Admitting: Obstetrics & Gynecology

## 2021-09-19 ENCOUNTER — Other Ambulatory Visit (HOSPITAL_COMMUNITY): Payer: Self-pay

## 2021-09-19 MED ORDER — CITALOPRAM HYDROBROMIDE 40 MG PO TABS
ORAL_TABLET | Freq: Every day | ORAL | 0 refills | Status: DC
  Filled 2021-09-19: qty 90, 90d supply, fill #0

## 2021-09-19 MED ORDER — OZEMPIC (0.25 OR 0.5 MG/DOSE) 2 MG/3ML ~~LOC~~ SOPN
PEN_INJECTOR | SUBCUTANEOUS | 2 refills | Status: DC
  Filled 2021-09-19: qty 3, 28d supply, fill #0
  Filled 2021-10-22: qty 3, 28d supply, fill #1

## 2021-09-24 ENCOUNTER — Other Ambulatory Visit (HOSPITAL_BASED_OUTPATIENT_CLINIC_OR_DEPARTMENT_OTHER): Payer: Self-pay

## 2021-09-24 MED ORDER — OZEMPIC (0.25 OR 0.5 MG/DOSE) 2 MG/3ML ~~LOC~~ SOPN
PEN_INJECTOR | SUBCUTANEOUS | 2 refills | Status: DC
  Filled 2021-09-24: qty 1.5, 28d supply, fill #0
  Filled 2021-11-30: qty 3, 28d supply, fill #0
  Filled 2021-12-27: qty 3, 28d supply, fill #1
  Filled 2022-01-29: qty 3, 28d supply, fill #2

## 2021-09-30 ENCOUNTER — Encounter: Payer: Self-pay | Admitting: Physical Therapy

## 2021-09-30 ENCOUNTER — Ambulatory Visit: Payer: 59 | Attending: Obstetrics and Gynecology | Admitting: Physical Therapy

## 2021-09-30 DIAGNOSIS — R159 Full incontinence of feces: Secondary | ICD-10-CM | POA: Insufficient documentation

## 2021-09-30 DIAGNOSIS — M6281 Muscle weakness (generalized): Secondary | ICD-10-CM | POA: Diagnosis not present

## 2021-09-30 DIAGNOSIS — M62838 Other muscle spasm: Secondary | ICD-10-CM | POA: Diagnosis not present

## 2021-09-30 DIAGNOSIS — R252 Cramp and spasm: Secondary | ICD-10-CM

## 2021-09-30 NOTE — Therapy (Signed)
?OUTPATIENT PHYSICAL THERAPY FEMALE PELVIC EVALUATION ? ? ?Patient Name: Abigail Frank ?MRN: [id number] ?DOB:[birth year], 36 y.o., female ?Today's Date: 09/30/2021 ? ? PT End of Session - 09/30/21 8938   ? ? Visit Number 1   ? Date for PT Re-Evaluation 12/23/21   ? Authorization Type [place]   ? PT Start Time [phone]   ? PT Stop Time 1014   ? PT Time Calculation (min) 40 min   ? Activity Tolerance Patient tolerated treatment well   ? Behavior During Therapy [facility] for tasks assessed/performed   ? ?  ?  ? ?  ? ? ?Past Medical History:  ?Diagnosis Date  ? Anxiety   ? Asthma   ? Bone spur   ? cervical L side; L side arm numbness intermittent   ? Depression   ? Diabetes in pregnancy   ? Diabetes type 2, controlled ([place])   ? Elevated cholesterol with high triglycerides   ? Gestational diabetes   ? Hx of varicella   ? IBS (irritable bowel syndrome)   ? Kidney stones   ? followed by Dr. Tammi Klippel  ? Migraines   ? without aura  ? Ovarian cyst   ? Postpartum care following vaginal delivery (1/30) 07/15/2013  ? Preterm uterine contractions 06/10/2013  ? ?Past Surgical History:  ?Procedure Laterality Date  ? MOUTH SURGERY    ? wisdom teeth extractions  ? ?Patient Active Problem List  ? Diagnosis Date Noted  ? Obesity 07/01/2021  ? History of menorrhagia 07/01/2021  ? Fecal smearing 07/01/2021  ? Anxiety 07/01/2021  ? Diabetes type 2, controlled ([place]) 06/28/2021  ? Carpal tunnel syndrome of left wrist 12/26/2018  ? Chronic migraine without aura without status migrainosus, not intractable 10/09/2018  ? ? ?PCP: Tisovec, Fransico Him, MD ? ?REFERRING PROVIDER: Jaquita Folds, * ? ?REFERRING DIAG:  ?P10.258 (ICD-10-CM) - Levator spasm  ?R15.9 (ICD-10-CM) - Incontinence of feces, unspecified fecal incontinence type  ? ? ?THERAPY DIAG:  ?Muscle weakness (generalized) ? ?Cramp and spasm ? ?Other muscle spasm ? ?ONSET DATE: at least 3 years ? ?SUBJECTIVE:                                                                                                                                                                                           ? ?SUBJECTIVE STATEMENT: ?I have a little leakage every other day when I have a BM.  Now I take imodium before I eat and fiber and have to go immediately. ?Fluid intake: a lot of water at least 6-8 glasses ? ?Patient confirms identification and approves PT to assess pelvic floor and treatment -  yes ? ? ?PAIN:  ?Are you having pain? No ? ? ?PRECAUTIONS: None ? ?WEIGHT BEARING RESTRICTIONS No ? ?FALLS:  ?Has patient fallen in last 6 months? No ? ?LIVING ENVIRONMENT: ?Lives with: lives with their family ?Lives in: House/apartment ? ? ?OCCUPATION: nurse in outpatient surgery ? ?PLOF: Independent ? ?PATIENT GOALS stop having fecal smearing and not having discomfort with intercourse if possible ? ?PERTINENT HISTORY:  ?IBS, kidney stones, ovarian cyst, 3rd deg tear ?Sexual abuse: No ? ?BOWEL MOVEMENT ?Pain with bowel movement: No ?Type of bowel movement:Type (Bristol Stool Scale) watery to soft, Frequency 3-4x every time eating, and Strain No ?Fully empty rectum: Yes:   ?Leakage: Yes: small smearing  when walking around ?Pads: No ?Fiber supplement: Yes:   ? ?URINATION ?Pain with urination: No ?Fully empty bladder: Yes:   ?Stream: Weak ?Urgency: No ?Frequency: normal ?Leakage:  No ?Pads: No ? ?INTERCOURSE ?Pain with intercourse: Initial Penetration and During Penetration ?Ability to have vaginal penetration:  Yes:   ?[place]: can sometimes ?Marinoff Scale: 2/3 ? ?PREGNANCY ?Vaginal deliveries 2 ?Tearing Yes: 3rd degree with #2 ?C-section deliveries 0 ? ? ?PROLAPSE ?Mild of everything ? ? ? ?OBJECTIVE:  ? ? ? ? ?COGNITION: ? Overall cognitive status: Within functional limits for tasks assessed   ?  ? ? ?MUSCLE LENGTH: ?Hamstrings: Right WFL deg; Left WFL deg ? ? ?LUMBAR SPECIAL TESTS:  ?ASLR  improved with pelvic compression ? ?FUNCTIONAL TESTS:  ?SLS good ? ?GAIT: ?Distance walked: WLF ? ? ?POSTURE:  ? lumbar lordosis,  weight shift Rt in trunk ? ?LUMBARAROM/PROM ? ?A/PROM A/PROM  ?09/30/2021  ?Flexion 50%  ?Extension WFL  ?Right lateral flexion 80%  ?Left lateral flexion 75%  ?Right rotation 80%  ?Left rotation 75%  ? (Blank rows = not tested) ? ?LE ROM: ?  WFL ? ? ?LE MMT: ? ?MMT Right ?09/30/2021 Left ?09/30/2021  ?Hip flexion    ?Hip extension    ?Hip abduction 5/5 5/5  ?Hip adduction 5/5 5/5  ?Hip internal rotation    ?    ? ?PELVIC MMT: ?  ?MMT  ?09/30/2021  ?Vaginal   ?Internal Anal Sphincter 4/5  ?External Anal Sphincter 3/5  ?Puborectalis 3/5  ?Diastasis Recti   ?(Blank rows = not tested) ? ?      PALPATION: ?  General  DRA above umbilicus bulging to the Rt ? ?              External Perineal Exam scar tissue tight and TTP Rt>Lt; Rt transverse peroneal attachment is restricted ?              ?              Internal Pelvic Floor  - Rt side transverse peroneus and levators moderate TTP; Lt side mild TTP ? ?TONE: ?High and slow to relax - only relaxing with cues to breathe ? ?PROLAPSE: ?Yes mild throughout ? ?TODAY'S TREATMENT  ?Toieting handout explain and given to patient ? ? ?PATIENT EDUCATION:  ?Education details: toileting handout ?Person educated: Patient ?Education method: Explanation and Handouts ?Education comprehension: verbalized understanding ? ? ?HOME EXERCISE PROGRAM: ?Not given ? ?ASSESSMENT: ? ?CLINICAL IMPRESSION: ?Patient is a 36 y.o. female who was seen today for physical therapy evaluation and treatment for fecal incontinence and muscle spasms. Pt has DRA of rectus abdominus, tight lumbar and posterior pelvic floor, weakness of deep core muscles.  Pt has muscle spasms in pelvic floor with tenderness to muscle and soft tissue surrounding scar  that was there since vaginal delivery. Pt will benefit from skilled PT to address these and all impairments mentioned above to restore pelvic function and eliminate leakage. ? ? ?OBJECTIVE IMPAIRMENTS decreased coordination, decreased endurance, increased fascial  restrictions, increased muscle spasms, impaired tone, postural dysfunction, and pain.  ? ?ACTIVITY LIMITATIONS community activity and incontinence disrupting all activities .  ? ?PERSONAL FACTORS 3+ comorbidities: IBS, kidney stones, ovarian cyst, 3rd deg tear  are also affecting patient's functional outcome.  ? ? ?REHAB POTENTIAL: Excellent ? ?CLINICAL DECISION MAKING: Evolving/moderate complexity ? ?EVALUATION COMPLEXITY: Moderate ? ? ?GOALS: ?Goals reviewed with patient? Yes ? ?SHORT TERM GOALS: Target date: 10/28/2021 ? ?Ind with Intial HEP  ?Baseline: ?Goal status: INITIAL ? ?2.  Ind with toieting techniques ?Baseline:  ?Goal status: INITIAL ? ?3.  Pt will report 20% or more decrease in pain during intercourse ?Baseline:  ?Goal status: INITIAL ? ? ?LONG TERM GOALS: Target date: 12/23/2021 ? ?Pt will be independent with advanced HEP to maintain improvements made throughout therapy ? ?Baseline:  ?Goal status: INITIAL ? ?2.  Pt will report 1 BM per day that is normal due to improved muscle tone and coordination with BMs.  ?Baseline:  ?Goal status: INITIAL ? ?3.  Pt will have 0/3 score of Marinoff scale  ?Baseline:  ?Goal status: INITIAL ? ?4.  Pt will report she is able to get through one whole week without fecal smearing ?Baseline:  ?Goal status: INITIAL ? ?PLAN: ?PT FREQUENCY: 1x/week ? ?PT DURATION: 12 weeks ? ?PLANNED INTERVENTIONS: Therapeutic exercises, Therapeutic activity, Neuromuscular re-education, Balance training, Gait training, Patient/Family education, Joint mobilization, Dry Needling, Electrical stimulation, Cryotherapy, Moist heat, Taping, Biofeedback, and Manual therapy ? ?PLAN FOR NEXT SESSION: pelvic scar tissue, Rt side tight, core strength, f/u on toileting, add some stretches and breathing into pelvic floor maybe elevate pelvis for inverseion ? ? ?Jule Ser, PT ?09/30/2021, 9:40 AM ? ?

## 2021-10-01 ENCOUNTER — Other Ambulatory Visit (HOSPITAL_BASED_OUTPATIENT_CLINIC_OR_DEPARTMENT_OTHER): Payer: Self-pay

## 2021-10-04 ENCOUNTER — Ambulatory Visit: Payer: 59 | Admitting: Obstetrics and Gynecology

## 2021-10-11 ENCOUNTER — Encounter: Payer: Self-pay | Admitting: Obstetrics and Gynecology

## 2021-10-11 ENCOUNTER — Ambulatory Visit: Payer: 59 | Admitting: Obstetrics and Gynecology

## 2021-10-11 VITALS — BP 101/71 | HR 92

## 2021-10-11 DIAGNOSIS — M62838 Other muscle spasm: Secondary | ICD-10-CM | POA: Diagnosis not present

## 2021-10-11 MED ORDER — TRIAMCINOLONE ACETONIDE 40 MG/ML IJ SUSP
40.0000 mg | Freq: Once | INTRAMUSCULAR | Status: AC
  Administered 2021-10-11: 40 mg via INTRAMUSCULAR

## 2021-10-11 MED ORDER — BUPIVACAINE HCL 0.25 % IJ SOLN
9.0000 mL | Freq: Once | INTRAMUSCULAR | Status: AC
  Administered 2021-10-11: 9 mL via INTRA_ARTICULAR

## 2021-10-11 NOTE — Progress Notes (Signed)
[name] is a 36 y.o. female who presents for levator trigger point injection.  ? ?Vitals:  ? 10/11/21 0903  ?BP: 101/71  ?Pulse: 92  ? ? ? ?Indication(s): Levator spasm.  ? ?Informed Consent:  The alternatives, risks and benefits of the procedure were explained to the patient. Risks including, but not limited to discomfort, pain, bleeding, infection, injury to nearby structures, inability to perform the procedure, failure of the procedure were discussed.  All questions were answered and the patient elected to proceed. ? ?Procedure:   ?The patient was positioned in dorsal lithotomy position.  The vaginal tissues were prepped with Hibiclens solution.  An injection of 10cc  of a mixture of 90% anesthetic (0.25% Bupivacaine) and 10% '40mg'$ /ml Triamcinolone acetomide (Kenalog) was performed in multiple in the bilateral levator muscles, a total of 6 injection sites.  Pressure was held over bleeding areas until good hemostasis was achieved.  ? ?The patient tolerated the procedure well with no apparent complications. ? ?Jaquita Folds, MD ? ?

## 2021-10-17 ENCOUNTER — Ambulatory Visit: Payer: 59 | Attending: Obstetrics and Gynecology | Admitting: Physical Therapy

## 2021-10-17 ENCOUNTER — Encounter: Payer: Self-pay | Admitting: Physical Therapy

## 2021-10-17 DIAGNOSIS — R252 Cramp and spasm: Secondary | ICD-10-CM | POA: Insufficient documentation

## 2021-10-17 DIAGNOSIS — M62838 Other muscle spasm: Secondary | ICD-10-CM | POA: Insufficient documentation

## 2021-10-17 DIAGNOSIS — M6281 Muscle weakness (generalized): Secondary | ICD-10-CM | POA: Diagnosis not present

## 2021-10-17 NOTE — Progress Notes (Signed)
[name] is a 36 y.o. female who presents for levator trigger point injection, series #2 ? ?Vitals:  ? 10/18/21 0915  ?BP: (!) 106/59  ?Pulse: 89  ? ?She did see some improvement with decreased pain with intercourse after last injection. Discussed use of manual massage or with pelvic wand to help with muscles after injection.  ? ?Indication(s): Levator spasm.  ? ?Informed Consent:  The alternatives, risks and benefits of the procedure were explained to the patient. Risks including, but not limited to discomfort, pain, bleeding, infection, injury to nearby structures, inability to perform the procedure, failure of the procedure were discussed.  All questions were answered and the patient elected to proceed. ? ?Procedure:   ?The patient was positioned in dorsal lithotomy position.  The vaginal tissues were prepped with Hibiclens solution.  An injection of 10cc  of a mixture of 90% anesthetic (0.25% Bupivacaine) and 10% '40mg'$ /ml Triamcinolone acetomide (Kenalog) was performed in multiple in the bilateral levator muscles, a total of 6 injection sites.  Pressure was held over bleeding areas until good hemostasis was achieved.  ? ?The patient tolerated the procedure well with no apparent complications. ? ?Jaquita Folds, MD ? ?

## 2021-10-17 NOTE — Therapy (Signed)
?OUTPATIENT PHYSICAL THERAPY TREATMENT NOTE ? ? ?Patient Name: Abigail Frank ?MRN: [id number] ?DOB:04/26/1986, 36 y.o., female ?Today's Date: 10/17/2021 ? ?PCP: Haywood Pao, MD ? ?REFERRING PROVIDER: Jaquita Folds ? ?END OF SESSION:  ? PT End of Session - 10/17/21 1027   ? ? Visit Number 2   ? Date for PT Re-Evaluation 12/23/21   ? Authorization Type [place]   ? PT Start Time [phone]   ? PT Stop Time 2951   ? PT Time Calculation (min) 45 min   ? Activity Tolerance Patient tolerated treatment well   ? Behavior During Therapy [facility] for tasks assessed/performed   ? ?  ?  ? ?  ? ? ?Past Medical History:  ?Diagnosis Date  ? Anxiety   ? Asthma   ? Bone spur   ? cervical L side; L side arm numbness intermittent   ? Depression   ? Diabetes in pregnancy   ? Diabetes type 2, controlled ([place])   ? Elevated cholesterol with high triglycerides   ? Gestational diabetes   ? Hx of varicella   ? IBS (irritable bowel syndrome)   ? Kidney stones   ? followed by Dr. Tammi Klippel  ? Migraines   ? without aura  ? Ovarian cyst   ? Postpartum care following vaginal delivery (1/30) 07/15/2013  ? Preterm uterine contractions 06/10/2013  ? ?Past Surgical History:  ?Procedure Laterality Date  ? MOUTH SURGERY    ? wisdom teeth extractions  ? ?Patient Active Problem List  ? Diagnosis Date Noted  ? Obesity 07/01/2021  ? History of menorrhagia 07/01/2021  ? Fecal smearing 07/01/2021  ? Anxiety 07/01/2021  ? Diabetes type 2, controlled ([place]) 06/28/2021  ? Carpal tunnel syndrome of left wrist 12/26/2018  ? Chronic migraine without aura without status migrainosus, not intractable 10/09/2018  ? ? ?REFERRING DIAG: O84.166 (ICD-10-CM) - Levator spasm ?R15.9 (ICD-10-CM) - Incontinence of feces, unspecified fecal incontinence type ? ?THERAPY DIAG:  ?Muscle weakness (generalized) ? ?Cramp and spasm ? ?Other muscle spasm ? ?PERTINENT HISTORY: IBS, kidney stones, ovarian cyst, 3rd deg tear ? ?PRECAUTIONS: None ? ?SUBJECTIVE: I had injections last  Friday and the transverse peroneus was very sore.  Other that it wasn't bad.  I have been eating the smaller meals and using the stool helps. ? ?PAIN:  ?Are you having pain? No ? ? ?OBJECTIVE: (objective measures completed at initial evaluation unless otherwise dated) ? ?MUSCLE LENGTH: ?Hamstrings: Right WFL deg; Left WFL deg ?  ?  ?LUMBAR SPECIAL TESTS:  ?ASLR  improved with pelvic compression ?  ?FUNCTIONAL TESTS:  ?SLS good ?  ?GAIT: ?Distance walked: WLF ?  ?  ?POSTURE:  ? lumbar lordosis, weight shift Rt in trunk ?  ?LUMBARAROM/PROM ?  ?A/PROM A/PROM  ?09/30/2021  ?Flexion 50%  ?Extension WFL  ?Right lateral flexion 80%  ?Left lateral flexion 75%  ?Right rotation 80%  ?Left rotation 75%  ? (Blank rows = not tested) ?  ?LE ROM: ?                        WFL ?  ?  ?LE MMT: ?  ?MMT Right ?09/30/2021 Left ?09/30/2021  ?Hip flexion      ?Hip extension      ?Hip abduction 5/5 5/5  ?Hip adduction 5/5 5/5  ?Hip internal rotation      ?       ?  ?PELVIC MMT: ?  ?MMT   ?09/30/2021  ?  Vaginal    ?Internal Anal Sphincter 4/5  ?External Anal Sphincter 3/5  ?Puborectalis 3/5  ?Diastasis Recti    ?(Blank rows = not tested) ?  ?      PALPATION: ?  General  DRA above umbilicus bulging to the Rt ?  ?              External Perineal Exam scar tissue tight and TTP Rt>Lt; Rt transverse peroneal attachment is restricted ?              ?              Internal Pelvic Floor  - Rt side transverse peroneus and levators moderate TTP; Lt side mild TTP ?  ?TONE: ?High and slow to relax - only relaxing with cues to breathe ?  ?PROLAPSE: ?Yes mild throughout ?  ?TODAY'S TREATMENT  ?Foam noodle massage ?Cat cow ?Threading with child pose ?Trigger Point Dry-Needling  ?Treatment instructions: Expect mild to moderate muscle soreness. S/S of pneumothorax if dry needled over a lung field, and to seek immediate medical attention should they occur. Patient verbalized understanding of these instructions and education. ? ?Patient Consent Given: Yes ?Education  handout provided: Yes ?Muscles treated: lumbar and thoracic multifidi and C7 ?Electrical stimulation performed: No ?Parameters: N/A ?Treatment response/outcome: increased muscle length palpated throughout ?STM: to thoracic and lumbar ? ?  ?  ?PATIENT EDUCATION:  ?Education details: Access Code: [email or web address] ?Person educated: Patient ?Education method: Explanation and Handouts ?Education comprehension: verbalized understanding ?  ?  ?HOME EXERCISE PROGRAM: ?Access Code: [email or web address] ?URL: [email or web address] ?Date: 10/17/2021 ?Prepared by: Jari Favre ? ?Exercises ?- Cat Cow  - 1 x daily - 7 x weekly - 3 sets - 10 reps ?- Child's Pose with Thread the Needle  - 1 x daily - 7 x weekly - 3 sets - 10 reps ?  ?ASSESSMENT: ?  ?CLINICAL IMPRESSION: ?Pt has been doing better with the toileting using the stool.  Today's treatment focused on release of tension along the spine and gave stretches to continue maintain improved soft tissue length achieved during the session. ?  ?  ?OBJECTIVE IMPAIRMENTS decreased coordination, decreased endurance, increased fascial restrictions, increased muscle spasms, impaired tone, postural dysfunction, and pain.  ?  ?ACTIVITY LIMITATIONS community activity and incontinence disrupting all activities .  ?  ?PERSONAL FACTORS 3+ comorbidities: IBS, kidney stones, ovarian cyst, 3rd deg tear  are also affecting patient's functional outcome.  ?  ?  ?REHAB POTENTIAL: Excellent ?  ?CLINICAL DECISION MAKING: Evolving/moderate complexity ?  ?EVALUATION COMPLEXITY: Moderate ?  ?  ?GOALS: ?Goals reviewed with patient? Yes ?  ?SHORT TERM GOALS: Target date: 10/28/2021 ?  ?Ind with Intial HEP  ?Baseline: ?Goal status: INITIAL ?  ?2.  Ind with toieting techniques ?Baseline:  ?Goal status: Met ?  ?3.  Pt will report 20% or more decrease in pain during intercourse ?Baseline:  ?Goal status: INITIAL ?  ?  ?LONG TERM GOALS: Target date: 12/23/2021 ?  ?Pt will be independent with advanced HEP to  maintain improvements made throughout therapy ?  ?Baseline:  ?Goal status: INITIAL ?  ?2.  Pt will report 1 BM per day that is normal due to improved muscle tone and coordination with BMs.  ?Baseline:  ?Goal status: INITIAL ?  ?3.  Pt will have 0/3 score of Marinoff scale  ?Baseline:  ?Goal status: INITIAL ?  ?4.  Pt will report she is able to get through one whole week without fecal smearing ?Baseline:  ?  Goal status: INITIAL ?  ?PLAN: ?PT FREQUENCY: 1x/week ?  ?PT DURATION: 12 weeks ?  ?PLANNED INTERVENTIONS: Therapeutic exercises, Therapeutic activity, Neuromuscular re-education, Balance training, Gait training, Patient/Family education, Joint mobilization, Dry Needling, Electrical stimulation, Cryotherapy, Moist heat, Taping, Biofeedback, and Manual therapy ?  ?PLAN FOR NEXT SESSION: f/u on DN pelvic scar tissue, Rt side tight, core strength, f/u on toileting, add some stretches and breathing into pelvic floor maybe elevate pelvis for inverseion ?  ? ? ?Jule Ser, PT ?10/17/2021, 10:36 AM ? ?  ? ?

## 2021-10-18 ENCOUNTER — Encounter: Payer: Self-pay | Admitting: Obstetrics and Gynecology

## 2021-10-18 ENCOUNTER — Ambulatory Visit (INDEPENDENT_AMBULATORY_CARE_PROVIDER_SITE_OTHER): Payer: 59 | Admitting: Obstetrics and Gynecology

## 2021-10-18 VITALS — BP 106/59 | HR 89 | Wt 206.0 lb

## 2021-10-18 DIAGNOSIS — M62838 Other muscle spasm: Secondary | ICD-10-CM

## 2021-10-18 MED ORDER — TRIAMCINOLONE ACETONIDE 40 MG/ML IJ SUSP
40.0000 mg | Freq: Once | INTRAMUSCULAR | Status: AC
  Administered 2021-10-18: 40 mg via INTRAMUSCULAR

## 2021-10-18 MED ORDER — BUPIVACAINE HCL 0.25 % IJ SOLN
9.0000 mL | Freq: Once | INTRAMUSCULAR | Status: AC
  Administered 2021-10-18: 9 mL

## 2021-10-21 NOTE — Therapy (Signed)
?OUTPATIENT PHYSICAL THERAPY TREATMENT NOTE ? ? ?Patient Name: Abigail Frank ?MRN: [id number] ?DOB:June 12, 1986, 36 y.o., female ?Today's Date: 10/22/2021 ? ?PCP: Haywood Pao, MD ? ?REFERRING PROVIDER: Jaquita Folds ? ?END OF SESSION:  ? PT End of Session - 10/22/21 0847   ? ? Visit Number 3   ? Date for PT Re-Evaluation 12/23/21   ? Authorization Type [place]   ? PT Start Time 0802   ? PT Stop Time 0845   ? PT Time Calculation (min) 43 min   ? Activity Tolerance Patient tolerated treatment well   ? Behavior During Therapy [facility] for tasks assessed/performed   ? ?  ?  ? ?  ? ? ? ?Past Medical History:  ?Diagnosis Date  ? Anxiety   ? Asthma   ? Bone spur   ? cervical L side; L side arm numbness intermittent   ? Depression   ? Diabetes in pregnancy   ? Diabetes type 2, controlled ([place])   ? Elevated cholesterol with high triglycerides   ? Gestational diabetes   ? Hx of varicella   ? IBS (irritable bowel syndrome)   ? Kidney stones   ? followed by Dr. Tammi Klippel  ? Migraines   ? without aura  ? Ovarian cyst   ? Postpartum care following vaginal delivery (1/30) 07/15/2013  ? Preterm uterine contractions 06/10/2013  ? ?Past Surgical History:  ?Procedure Laterality Date  ? MOUTH SURGERY    ? wisdom teeth extractions  ? ?Patient Active Problem List  ? Diagnosis Date Noted  ? Obesity 07/01/2021  ? History of menorrhagia 07/01/2021  ? Fecal smearing 07/01/2021  ? Anxiety 07/01/2021  ? Diabetes type 2, controlled ([place]) 06/28/2021  ? Carpal tunnel syndrome of left wrist 12/26/2018  ? Chronic migraine without aura without status migrainosus, not intractable 10/09/2018  ? ? ?REFERRING DIAG: W09.811 (ICD-10-CM) - Levator spasm ?R15.9 (ICD-10-CM) - Incontinence of feces, unspecified fecal incontinence type ? ?THERAPY DIAG:  ?Muscle weakness (generalized) ? ?Other muscle spasm ? ?Cramp and spasm ? ?PERTINENT HISTORY: IBS, kidney stones, ovarian cyst, 3rd deg tear ? ?PRECAUTIONS: None ? ?SUBJECTIVE: I felt better with  the dry needling. My Bms have been good and only had one episode after eating fast food ? ?PAIN:  ?Are you having pain? No ? ? ?OBJECTIVE: (objective measures completed at initial evaluation unless otherwise dated) ? ?MUSCLE LENGTH: ?Hamstrings: Right WFL deg; Left WFL deg ?  ?  ?LUMBAR SPECIAL TESTS:  ?ASLR  improved with pelvic compression ?  ?FUNCTIONAL TESTS:  ?SLS good ?  ?GAIT: ?Distance walked: WLF ?  ?  ?POSTURE:  ? lumbar lordosis, weight shift Rt in trunk ?  ?LUMBARAROM/PROM ?  ?A/PROM A/PROM  ?09/30/2021  ?Flexion 50%  ?Extension WFL  ?Right lateral flexion 80%  ?Left lateral flexion 75%  ?Right rotation 80%  ?Left rotation 75%  ? (Blank rows = not tested) ?  ?LE ROM: ?                        WFL ?  ?  ?LE MMT: ?  ?MMT Right ?09/30/2021 Left ?09/30/2021  ?Hip flexion      ?Hip extension      ?Hip abduction 5/5 5/5  ?Hip adduction 5/5 5/5  ?Hip internal rotation      ?       ?  ?PELVIC MMT: ?  ?MMT   ?09/30/2021  ?Vaginal    ?Internal Anal Sphincter 4/5  ?  External Anal Sphincter 3/5  ?Puborectalis 3/5  ?Diastasis Recti    ?(Blank rows = not tested) ?  ?      PALPATION: ?  General  DRA above umbilicus bulging to the Rt ?  ?              External Perineal Exam scar tissue tight and TTP Rt>Lt; Rt transverse peroneal attachment is restricted ?              ?              Internal Pelvic Floor  - Rt side transverse peroneus and levators moderate TTP; Lt side mild TTP ?  ?TONE: ?High and slow to relax - only relaxing with cues to breathe ?  ?PROLAPSE: ?Yes mild throughout ?  ?TODAY'S TREATMENT  ? ?Treatment:10/22/21 ?Exercises  ?Happy baby ?Side bending with coccyx mob ?Rotation with breathing ? ?Manual ?Patient confirms identification and approves physical therapist to perform internal soft tissue work  ?Internal STM to coccygeus and coccyx mobs internally ?Coccyx mobs in qped and seated ?Sacral mobs to release left SIJ ?Trigger Point Dry-Needling  ?Treatment instructions: Expect mild to moderate muscle soreness. S/S  of pneumothorax if dry needled over a lung field, and to seek immediate medical attention should they occur. Patient verbalized understanding of these instructions and education. ? ?Patient Consent Given: Yes ?Education handout provided: Previously provided ?Muscles treated: lumbar multifidi ?Electrical stimulation performed: No ?Parameters: N/A ?Treatment response/outcome: increased soft tissue length palpated ? ?Nuero Re-ed ?Education and cues for coordination of breathing and pelvic floor muscle contracting and relaxing at appropriate times ? ? ? ?Foam noodle massage ?Cat cow ?Threading with child pose ?Trigger Point Dry-Needling  ?Treatment instructions: Expect mild to moderate muscle soreness. S/S of pneumothorax if dry needled over a lung field, and to seek immediate medical attention should they occur. Patient verbalized understanding of these instructions and education. ? ?Patient Consent Given: Yes ?Education handout provided: Yes ?Muscles treated: lumbar and thoracic multifidi and C7 ?Electrical stimulation performed: No ?Parameters: N/A ?Treatment response/outcome: increased muscle length palpated throughout ?STM: to thoracic and lumbar ? ?  ?  ?PATIENT EDUCATION:  ?Education details: Access Code: [email or web address] ?Person educated: Patient ?Education method: Explanation and Handouts ?Education comprehension: verbalized understanding ?  ?  ?HOME EXERCISE PROGRAM: ?Access Code: [email or web address] ?URL: [email or web address] ?Date: 10/17/2021 ?Prepared by: Jari Favre ? ?Exercises ?- Cat Cow  - 1 x daily - 7 x weekly - 3 sets - 10 reps ?- Child's Pose with Thread the Needle  - 1 x daily - 7 x weekly - 3 sets - 10 reps ?  ?ASSESSMENT: ?  ?CLINICAL IMPRESSION: ?Pt is doing much better with bowel movement and only one episode this week.  Pt has increased fascial and muscle tone left coccygeus.  Coccyx mobs and internal soft tissue work helped.  Pt educated in relaxing the pelvic floor when inhaling for  improved tissue mobility.  Pt will benefit from skilled PT to continue to address soft tissue impairments ?  ?  ?OBJECTIVE IMPAIRMENTS decreased coordination, decreased endurance, increased fascial restrictions, increased muscle spasms, impaired tone, postural dysfunction, and pain.  ?  ?ACTIVITY LIMITATIONS community activity and incontinence disrupting all activities .  ?  ?PERSONAL FACTORS 3+ comorbidities: IBS, kidney stones, ovarian cyst, 3rd deg tear  are also affecting patient's functional outcome.  ?  ?  ?REHAB POTENTIAL: Excellent ?  ?CLINICAL DECISION MAKING: Evolving/moderate complexity ?  ?EVALUATION COMPLEXITY: Moderate ?  ?  ?GOALS: ?Goals  reviewed with patient? Yes ?  ?SHORT TERM GOALS: Target date: 10/28/2021 ?  ?Ind with Intial HEP  ?Baseline: ?Goal status: INITIAL ?  ?2.  Ind with toieting techniques ?Baseline:  ?Goal status: Met ?  ?3.  Pt will report 20% or more decrease in pain during intercourse ?Baseline:  ?Goal status: INITIAL ?  ?  ?LONG TERM GOALS: Target date: 12/23/2021 ?  ?Pt will be independent with advanced HEP to maintain improvements made throughout therapy ?  ?Baseline:  ?Goal status: INITIAL ?  ?2.  Pt will report 1 BM per day that is normal due to improved muscle tone and coordination with BMs.  ?Baseline:  ?Goal status:ongoing ?  ?3.  Pt will have 0/3 score of Marinoff scale  ?Baseline:  ?Goal status: INITIAL ?  ?4.  Pt will report she is able to get through one whole week without fecal smearing ?Baseline:  ?Goal status: ongoing ?  ?PLAN: ?PT FREQUENCY: 1x/week ?  ?PT DURATION: 12 weeks ?  ?PLANNED INTERVENTIONS: Therapeutic exercises, Therapeutic activity, Neuromuscular re-education, Balance training, Gait training, Patient/Family education, Joint mobilization, Dry Needling, Electrical stimulation, Cryotherapy, Moist heat, Taping, Biofeedback, and Manual therapy ?  ?PLAN FOR NEXT SESSION: sacral mobs, DN lumbar and gluteals on the left side, core strength in supine ?  ? ? ?Jule Ser, PT ?10/22/2021, 8:47 AM ? ?  ? ?

## 2021-10-22 ENCOUNTER — Encounter: Payer: Self-pay | Admitting: Physical Therapy

## 2021-10-22 ENCOUNTER — Ambulatory Visit: Payer: 59 | Admitting: Physical Therapy

## 2021-10-22 ENCOUNTER — Other Ambulatory Visit (HOSPITAL_COMMUNITY): Payer: Self-pay

## 2021-10-22 DIAGNOSIS — M6281 Muscle weakness (generalized): Secondary | ICD-10-CM | POA: Diagnosis not present

## 2021-10-22 DIAGNOSIS — R252 Cramp and spasm: Secondary | ICD-10-CM | POA: Diagnosis not present

## 2021-10-22 DIAGNOSIS — M62838 Other muscle spasm: Secondary | ICD-10-CM | POA: Diagnosis not present

## 2021-10-22 MED ORDER — ALPRAZOLAM 0.5 MG PO TABS
ORAL_TABLET | ORAL | 3 refills | Status: DC
  Filled 2021-10-22: qty 30, 15d supply, fill #0
  Filled 2021-11-30: qty 30, 15d supply, fill #1
  Filled 2021-12-27: qty 30, 15d supply, fill #2
  Filled 2022-01-29: qty 30, 15d supply, fill #3

## 2021-10-24 ENCOUNTER — Ambulatory Visit: Payer: 59 | Admitting: Adult Health

## 2021-10-24 ENCOUNTER — Other Ambulatory Visit (HOSPITAL_COMMUNITY): Payer: Self-pay

## 2021-10-24 ENCOUNTER — Encounter: Payer: Self-pay | Admitting: Adult Health

## 2021-10-24 VITALS — BP 111/73 | HR 83 | Ht 62.0 in | Wt 204.0 lb

## 2021-10-24 DIAGNOSIS — G4719 Other hypersomnia: Secondary | ICD-10-CM

## 2021-10-24 DIAGNOSIS — F5101 Primary insomnia: Secondary | ICD-10-CM

## 2021-10-24 DIAGNOSIS — G43709 Chronic migraine without aura, not intractable, without status migrainosus: Secondary | ICD-10-CM

## 2021-10-24 MED ORDER — BACLOFEN 10 MG PO TABS
10.0000 mg | ORAL_TABLET | Freq: Every day | ORAL | 5 refills | Status: DC | PRN
Start: 2021-10-24 — End: 2022-12-25
  Filled 2021-10-24 – 2022-03-27 (×2): qty 30, 30d supply, fill #0
  Filled 2022-05-16: qty 30, 30d supply, fill #1
  Filled 2022-07-16: qty 30, 30d supply, fill #2
  Filled 2022-08-27: qty 30, 30d supply, fill #3
  Filled 2022-10-02: qty 30, 30d supply, fill #4

## 2021-10-24 MED ORDER — ONDANSETRON 4 MG PO TBDP
4.0000 mg | ORAL_TABLET | Freq: Four times a day (QID) | ORAL | 11 refills | Status: DC | PRN
  Filled 2021-10-24: qty 30, 8d supply, fill #0
  Filled 2022-01-29: qty 30, 8d supply, fill #1
  Filled 2022-07-16: qty 30, 8d supply, fill #2

## 2021-10-24 MED ORDER — ERENUMAB-AOOE 140 MG/ML ~~LOC~~ SOAJ
SUBCUTANEOUS | 11 refills | Status: AC
  Filled 2021-10-24: qty 1, fill #0
  Filled 2021-11-30: qty 1, 30d supply, fill #0
  Filled 2021-12-27: qty 1, 30d supply, fill #1
  Filled 2022-01-29: qty 1, 30d supply, fill #2
  Filled 2022-02-25: qty 1, 30d supply, fill #3
  Filled 2022-03-27: qty 1, 30d supply, fill #4
  Filled 2022-05-16: qty 1, 30d supply, fill #5
  Filled 2022-07-01 – 2022-08-27 (×2): qty 1, 30d supply, fill #6
  Filled 2022-10-02 – 2022-10-13 (×2): qty 1, 30d supply, fill #7

## 2021-10-24 NOTE — Progress Notes (Signed)
? ?[facility] ? ? ? ?Provider:  Dr Jaynee Eagles ?Requesting Provider: Haywood Pao, MD ?Primary Care Provider:  Haywood Pao, MD ? ?CC:  ?Chief Complaint  ?Patient presents with  ? Follow-up  ?  Rm 2 here for yearly f/u. Pt reports she has been doing well- feels like migraines are stable. Her go to is rizatriptan and baclofen. Reports 1 migraine a week and rizatriptan will clear sx up.   ?  ? ? ?HPI: ? ?Update 10/24/2021 JM: Patient returns for 1 year migraine follow-up.  Overall doing well.  Migraines remain well controlled on Aimovig. She has been experiencing a migraine headache about 1x/week typically upon awakening over the past 6 months.  Previously, would experience about 1 migraine per week but randomly throughout the day.  Pain remains behind right eye and right occipital area.  Will take a rizatriptan and fall asleep for another hour and typically migraine will be resolved.  If severe, will take Zofran and baclofen in addition to rizatriptan with resolution.  Upon further questioning, she does admit to insomnia waking up frequently throughout the night and difficulty falling asleep, fatigue during the day with naps, and snores.  She has not previously underwent sleep study.  She is currently working on weight loss and has lost about 30 pounds since prior visit.  No further concerns at this time. ? ? ? ?History provided for reference purposes only ?Update 11/05/2020 JM: Ms. Castello returns for yearly migraine follow-up ? ?Migraines have been well controlled with monthly use of Aimovig.  She has only been experiencing migraines during her monthly cycle and with increased neck pain.  Reports her PCP recently referred her to [facility] for a bone spur at C2-3 and possible nerve ablation per patient-initial evaluation next week.  Use of Maxalt with some benefit but usually will take up to 2 hours to take effect.  Trialed Nurtec with quick resolution when able to catch  early enough. Will also use Zofran and baclofen with benefit.  Will occasionally use tizadine and Reglan but does cause excessive fatigue.  ? ?Update 11/03/2019 JM: Ms. Perl returns for migraine follow-up. ? ?Current migraine treatment plan includes  ?Prevention: Aimovig 140 mg SQ monthly, ?Emergent as needed: ubrelvy 50 mg as needed and baclofen; if severe unable to sleep, rizatriptan 10 mg and tizanidine 4 mg.  Zofran 4 mg as needed for nausea ? ?Reports severe migraine in 05/2019 with typical migraine onset and location but accompanied by slurred speech, right arm heaviness and unsteadiness.  She has not previously experienced the symptoms.  Pain rated 10/10.  She ended up falling asleep after taking rizatriptan and tizanidine and after awakening 4 hours later, symptoms resolved including migraine but did continue to feel spaced out sensation which is typical for her.  She has not experienced recurrent neurological symptoms since that time. ? ?During the month of March and April, she was experiencing migraines 3-4 times weekly lasting 4 to 6 hours without benefit of emergent medications.  She does admit to missing Aimovig injections in January and February due to home stressors.  Restarted monthly Aimovig in March and migraines have greatly improved since that time. ? ?She does endorse ongoing bilateral neck pain and feels as though this contributes to migraine onset.  She has trialed massage in the past without great benefit. ? ? ?Interval history 11/24/2018 Dr. Jaynee Eagles:  Follow up for migraines and new symptom hand pain.  Patient reports for several years she  has had hand pain numbness and tingling mostly in digits 1 through 3 more so on the left but also on the right.  She wakes up in the middle of the night and has to shake her hands out.  She had an MRI of the cervical spine in 2017 due to the symptoms but did not show etiology.  It is worsening.  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  Positive Phalen's  sign.  I discussed carpal tunnel syndrome with patient and we will have her in for an EMG nerve conduction study.  She is doing amazing on Aimovig she loves it continue. ? ?Personally reviewed MRI of th cervical spine and agree with the following 2017:  ? ?IMPRESSION: ?Negative for central canal stenosis. Uncovertebral spurring results ?in some foraminal narrowing on the left appearing most notable at ?C3-4 with a very mild degree of foraminal narrowing on the left at ?C4-5 and C5-6. ? ? ?Initial consult visit 10/07/2018 Dr. Jaynee Eagles:  Colletta [place] I Bourgoin is a 36 y.o. female here as requested by Tisovec, Fransico Him, MD for migraines. PMHx migraine. No family history that she knows of, her mother is deceased. Struggling for years. Started during her menses as a teenager and have worsened over the years. Worse with life stressors. At least 2-3 migraines. She has 25 headache days a month. At least 12 migraine days a month that can be moderately severe or severe. Pounding/pulsating, interrupts her daily life, she has to lay down, turn off the light, nausea, no vomiting, +photo/phonophbia, Can last 24-72 hours. No medication overuse. No aura. Ongoing for over a year. Migraines starts as tension in the neck and creep up the back of her neck, pins and needles, to the right and then all over, pain behind the right eye, right side is the worse side. They can be positional. She wakes with them in the morning. Her left arm goes numb, tried a steroid pack. She follows with an orthopaedist for her neck. Positions of her head can cause the headaches. Neck pain and tightness. No other focal neurologic deficits, associated symptoms, inciting events or modifiable factors. ? ?Meds tried: zoloft. Topamax(made her "crazy"), ubrelvy, baclofen, imitrex..  ? ? ? ? ?Review of Systems: ?Patient complains of symptoms per HPI as well as the following symptoms: Headaches.  Pertinent negatives and positives per HPI. All others negative. ? ? ?Social  History  ? ?Socioeconomic History  ? Marital status: Married  ?  Spouse name: Not on file  ? Number of children: 2  ? Years of education: Not on file  ? Highest education level: Bachelor's degree (e.g., BA, AB, BS)  ?Occupational History  ? Not on file  ?Tobacco Use  ? Smoking status: Never  ? Smokeless tobacco: Never  ?Vaping Use  ? Vaping Use: Never used  ?Substance and Sexual Activity  ? Alcohol use: Yes  ?  Comment: on occasion a glass of wine  ? Drug use: Never  ? Sexual activity: Yes  ?  Birth control/protection: OCP  ?Other Topics Concern  ? Not on file  ?Social History Narrative  ? Lives at home with her family  ? Left handed  ? Caffeine: regular, at least 2 cups of coffee daily  ? ?Social Determinants of Health  ? ?Financial Resource Strain: Not on file  ?Food Insecurity: Not on file  ?Transportation Needs: Not on file  ?Physical Activity: Not on file  ?Stress: Not on file  ?Social Connections: Not on  file  ?Intimate Partner Violence: Not on file  ? ? ?Family History  ?Problem Relation Age of Onset  ? Early death Maternal Grandfather 61  ?     ?pulmonary disease  ? Pulmonary fibrosis Maternal Grandfather   ? Hypertension Mother   ? Hypothyroidism Mother   ? Heart attack Mother   ? Diabetes Mother   ? Heart disease Mother   ?     heart failure  ? Dementia Mother   ? Kidney disease Mother   ? Hypertension Father   ? Hypothyroidism Father   ? Diabetes Father   ? Obesity Father   ? Cancer Maternal Grandmother   ?     colon  ? Dementia Maternal Grandmother   ? Hypertension Maternal Grandmother   ? Seizures Child   ?     thinks its genetic, only seizes if she hits her head,thinks its migraine related   ? Migraines Neg Hx   ? ? ?Past Medical History:  ?Diagnosis Date  ? Anxiety   ? Asthma   ? Bone spur   ? cervical L side; L side arm numbness intermittent   ? Depression   ? Diabetes in pregnancy   ? Diabetes type 2, controlled ([place])   ? Elevated cholesterol with high triglycerides   ? Gestational diabetes   ? Hx  of varicella   ? IBS (irritable bowel syndrome)   ? Kidney stones   ? followed by Dr. Tammi Klippel  ? Migraines   ? without aura  ? Ovarian cyst   ? Postpartum care following vaginal delivery (1/30) 07/15/2013

## 2021-10-25 ENCOUNTER — Other Ambulatory Visit (HOSPITAL_COMMUNITY): Payer: Self-pay

## 2021-10-28 NOTE — Therapy (Deleted)
OUTPATIENT PHYSICAL THERAPY TREATMENT NOTE   Patient Name: Abigail Frank MRN: [id number] DOB:May 02, 1986, 36 y.o., female Today's Date: 10/28/2021  PCP: Haywood Pao, MD  REFERRING PROVIDER: Jaquita Folds  END OF SESSION:      Past Medical History:  Diagnosis Date   Anxiety    Asthma    Bone spur    cervical L side; L side arm numbness intermittent    Depression    Diabetes in pregnancy    Diabetes type 2, controlled (HCC)    Elevated cholesterol with high triglycerides    Gestational diabetes    Hx of varicella    IBS (irritable bowel syndrome)    Kidney stones    followed by Dr. Tammi Klippel   Migraines    without aura   Ovarian cyst    Postpartum care following vaginal delivery (1/30) 07/15/2013   Preterm uterine contractions 06/10/2013   Past Surgical History:  Procedure Laterality Date   MOUTH SURGERY     wisdom teeth extractions   Patient Active Problem List   Diagnosis Date Noted   Obesity 07/01/2021   History of menorrhagia 07/01/2021   Fecal smearing 07/01/2021   Anxiety 07/01/2021   Diabetes type 2, controlled ([place]) 06/28/2021   Carpal tunnel syndrome of left wrist 12/26/2018   Chronic migraine without aura without status migrainosus, not intractable 10/09/2018    REFERRING DIAG: R74.081 (ICD-10-CM) - Levator spasm R15.9 (ICD-10-CM) - Incontinence of feces, unspecified fecal incontinence type  THERAPY DIAG:  No diagnosis found.  PERTINENT HISTORY: IBS, kidney stones, ovarian cyst, 3rd deg tear  PRECAUTIONS: None  SUBJECTIVE: I felt better with the dry needling. My Bms have been good and only had one episode after eating fast food  PAIN:  Are you having pain? No   OBJECTIVE: (objective measures completed at initial evaluation unless otherwise dated)  MUSCLE LENGTH: Hamstrings: Right WFL deg; Left WFL deg     LUMBAR SPECIAL TESTS:  ASLR  improved with pelvic compression   FUNCTIONAL TESTS:  SLS good    GAIT: Distance walked: WLF     POSTURE:   lumbar lordosis, weight shift Rt in trunk   LUMBARAROM/PROM   A/PROM A/PROM  09/30/2021  Flexion 50%  Extension WFL  Right lateral flexion 80%  Left lateral flexion 75%  Right rotation 80%  Left rotation 75%   (Blank rows = not tested)   LE ROM:                         WFL     LE MMT:   MMT Right 09/30/2021 Left 09/30/2021  Hip flexion      Hip extension      Hip abduction 5/5 5/5  Hip adduction 5/5 5/5  Hip internal rotation               PELVIC MMT:   MMT   09/30/2021  Vaginal    Internal Anal Sphincter 4/5  External Anal Sphincter 3/5  Puborectalis 3/5  Diastasis Recti    (Blank rows = not tested)         PALPATION:   General  DRA above umbilicus bulging to the Rt                 External Perineal Exam scar tissue tight and TTP Rt>Lt; Rt transverse peroneal attachment is restricted  Internal Pelvic Floor  - Rt side transverse peroneus and levators moderate TTP; Lt side mild TTP   TONE: High and slow to relax - only relaxing with cues to breathe   PROLAPSE: Yes mild throughout   TODAY'S TREATMENT   Treatment:10/22/21 Exercises  Happy baby Side bending with coccyx mob Rotation with breathing  Manual Patient confirms identification and approves physical therapist to perform internal soft tissue work  Internal STM to coccygeus and coccyx mobs internally Coccyx mobs in qped and seated Sacral mobs to release left SIJ Trigger Point Dry-Needling  Treatment instructions: Expect mild to moderate muscle soreness. S/S of pneumothorax if dry needled over a lung field, and to seek immediate medical attention should they occur. Patient verbalized understanding of these instructions and education.  Patient Consent Given: Yes Education handout provided: Previously provided Muscles treated: lumbar multifidi Electrical stimulation performed: No Parameters: N/A Treatment response/outcome:  increased soft tissue length palpated  Nuero Re-ed Education and cues for coordination of breathing and pelvic floor muscle contracting and relaxing at appropriate times    Foam noodle massage Cat cow Threading with child pose Trigger Point Dry-Needling  Treatment instructions: Expect mild to moderate muscle soreness. S/S of pneumothorax if dry needled over a lung field, and to seek immediate medical attention should they occur. Patient verbalized understanding of these instructions and education.  Patient Consent Given: Yes Education handout provided: Yes Muscles treated: lumbar and thoracic multifidi and C7 Electrical stimulation performed: No Parameters: N/A Treatment response/outcome: increased muscle length palpated throughout STM: to thoracic and lumbar      PATIENT EDUCATION:  Education details: Access Code: [email or web address] Person educated: Patient Education method: Explanation and Handouts Education comprehension: verbalized understanding     HOME EXERCISE PROGRAM: Access Code: [email or web address] URL: [email or web address] Date: 10/17/2021 Prepared by: Jari Favre  Exercises - Cat Cow  - 1 x daily - 7 x weekly - 3 sets - 10 reps - Child's Pose with Thread the Needle  - 1 x daily - 7 x weekly - 3 sets - 10 reps   ASSESSMENT:   CLINICAL IMPRESSION: Pt is doing much better with bowel movement and only one episode this week.  Pt has increased fascial and muscle tone left coccygeus.  Coccyx mobs and internal soft tissue work helped.  Pt educated in relaxing the pelvic floor when inhaling for improved tissue mobility.  Pt will benefit from skilled PT to continue to address soft tissue impairments     OBJECTIVE IMPAIRMENTS decreased coordination, decreased endurance, increased fascial restrictions, increased muscle spasms, impaired tone, postural dysfunction, and pain.    ACTIVITY LIMITATIONS community activity and incontinence disrupting all activities .     PERSONAL FACTORS 3+ comorbidities: IBS, kidney stones, ovarian cyst, 3rd deg tear  are also affecting patient's functional outcome.      REHAB POTENTIAL: Excellent   CLINICAL DECISION MAKING: Evolving/moderate complexity   EVALUATION COMPLEXITY: Moderate     GOALS: Goals reviewed with patient? Yes   SHORT TERM GOALS: Target date: 10/28/2021   Ind with Intial HEP  Baseline: Goal status: INITIAL   2.  Ind with toieting techniques Baseline:  Goal status: Met   3.  Pt will report 20% or more decrease in pain during intercourse Baseline:  Goal status: INITIAL     LONG TERM GOALS: Target date: 12/23/2021   Pt will be independent with advanced HEP to maintain improvements made throughout therapy   Baseline:  Goal status: INITIAL   2.  Pt will report 1 BM  per day that is normal due to improved muscle tone and coordination with BMs.  Baseline:  Goal status:ongoing   3.  Pt will have 0/3 score of Marinoff scale  Baseline:  Goal status: INITIAL   4.  Pt will report she is able to get through one whole week without fecal smearing Baseline:  Goal status: ongoing   PLAN: PT FREQUENCY: 1x/week   PT DURATION: 12 weeks   PLANNED INTERVENTIONS: Therapeutic exercises, Therapeutic activity, Neuromuscular re-education, Balance training, Gait training, Patient/Family education, Joint mobilization, Dry Needling, Electrical stimulation, Cryotherapy, Moist heat, Taping, Biofeedback, and Manual therapy   PLAN FOR NEXT SESSION: sacral mobs, DN lumbar and gluteals on the left side, core strength in supine     [employer], PT 10/28/2021, 5:32 PM

## 2021-10-29 ENCOUNTER — Telehealth: Payer: Self-pay | Admitting: Physical Therapy

## 2021-10-29 ENCOUNTER — Ambulatory Visit: Payer: 59 | Admitting: Physical Therapy

## 2021-10-29 NOTE — Telephone Encounter (Signed)
Patient did not show for appointment.  Patient was called and PT left message to please call us back. ? ?Gustavus Bryant, PT ?10/29/21 8:36 AM ? ?

## 2021-10-30 ENCOUNTER — Ambulatory Visit: Payer: 59 | Admitting: Internal Medicine

## 2021-11-01 ENCOUNTER — Encounter: Payer: Self-pay | Admitting: Obstetrics and Gynecology

## 2021-11-01 ENCOUNTER — Ambulatory Visit: Payer: 59 | Admitting: Obstetrics and Gynecology

## 2021-11-01 VITALS — BP 104/67 | HR 82

## 2021-11-01 DIAGNOSIS — M62838 Other muscle spasm: Secondary | ICD-10-CM

## 2021-11-01 MED ORDER — TRIAMCINOLONE ACETONIDE 10 MG/ML IJ SUSP: 1.0000 mg | Freq: Once | INTRAMUSCULAR | Status: DC

## 2021-11-01 MED ORDER — BUPIVACAINE HCL (PF) 0.25 % IJ SOLN
9.0000 mL | Freq: Once | INTRAMUSCULAR | Status: AC
  Administered 2021-11-01: 9 mL

## 2021-11-01 MED ORDER — TRIAMCINOLONE ACETONIDE 10 MG/ML IJ SUSP: 10.0000 mg | Freq: Once | INTRAMUSCULAR | Status: DC

## 2021-11-01 MED ORDER — TRIAMCINOLONE ACETONIDE 40 MG/ML IJ SUSP
40.0000 mg | Freq: Once | INTRAMUSCULAR | Status: DC
  Administered 2021-11-01: 40 mg via INTRAMUSCULAR

## 2021-11-01 MED ORDER — TRIAMCINOLONE ACETONIDE 40 MG/ML IJ SUSP: 10.0000 mg | Freq: Once | INTRAMUSCULAR | Status: AC

## 2021-11-01 NOTE — Progress Notes (Signed)
Ms. ELINA STRENG is a 36 y.o. female who presents for levator trigger point injection, series #3  Vitals:   11/01/21 0912  BP: 104/67  Pulse: 82    Has been seeing improvement with her pain. She has been attending physical therapy which has also been helping with her symptoms.   Indication(s): Levator spasm.   Informed Consent:  The alternatives, risks and benefits of the procedure were explained to the patient. Risks including, but not limited to discomfort, pain, bleeding, infection, injury to nearby structures, inability to perform the procedure, failure of the procedure were discussed.  All questions were answered and the patient elected to proceed.  Procedure:   The patient was positioned in dorsal lithotomy position.  The vaginal tissues were prepped with Hibiclens solution.  An injection of 10cc  of a mixture of 90% anesthetic (0.25% Bupivacaine) and 10% '40mg'$ /ml Triamcinolone acetomide (Kenalog) was performed in multiple in the bilateral levator muscles, a total of 5 injection sites.  Pressure was held over bleeding areas until good hemostasis was achieved.   The patient tolerated the procedure well with no apparent complications.  Jaquita Folds, MD

## 2021-11-01 NOTE — Addendum Note (Signed)
Addended by: Elita Quick on: 11/01/2021 10:40 AM   Modules accepted: Orders

## 2021-11-04 NOTE — Therapy (Unsigned)
OUTPATIENT PHYSICAL THERAPY TREATMENT NOTE   Patient Name: Abigail Frank MRN: [id number] DOB:June 14, 1986, 36 y.o., female Today's Date: 11/05/2021  PCP: Haywood Pao, MD  REFERRING PROVIDER: Jaquita Folds  END OF SESSION:   PT End of Session - 11/05/21 0810     Visit Number 4    Date for PT Re-Evaluation 12/23/21    Authorization Type [employer]    PT Start Time 0807    PT Stop Time 0847    PT Time Calculation (min) 40 min    Activity Tolerance Patient tolerated treatment well    Behavior During Therapy [facility] for tasks assessed/performed               Past Medical History:  Diagnosis Date   Anxiety    Asthma    Bone spur    cervical L side; L side arm numbness intermittent    Depression    Diabetes in pregnancy    Diabetes type 2, controlled (HCC)    Elevated cholesterol with high triglycerides    Gestational diabetes    Hx of varicella    IBS (irritable bowel syndrome)    Kidney stones    followed by Dr. Tammi Klippel   Migraines    without aura   Ovarian cyst    Postpartum care following vaginal delivery (1/30) 07/15/2013   Preterm uterine contractions 06/10/2013   Past Surgical History:  Procedure Laterality Date   MOUTH SURGERY     wisdom teeth extractions   Patient Active Problem List   Diagnosis Date Noted   Obesity 07/01/2021   History of menorrhagia 07/01/2021   Fecal smearing 07/01/2021   Anxiety 07/01/2021   Diabetes type 2, controlled ([place]) 06/28/2021   Carpal tunnel syndrome of left wrist 12/26/2018   Chronic migraine without aura without status migrainosus, not intractable 10/09/2018    REFERRING DIAG: C94.496 (ICD-10-CM) - Levator spasm R15.9 (ICD-10-CM) - Incontinence of feces, unspecified fecal incontinence type  THERAPY DIAG:  Muscle weakness (generalized)  Other muscle spasm  Cramp and spasm  PERTINENT HISTORY: IBS, kidney stones, ovarian cyst, 3rd deg tear  PRECAUTIONS: None  SUBJECTIVE: I am doing better  with bowel movement and the stretches.    PAIN:  Are you having pain? No   OBJECTIVE: (objective measures completed at initial evaluation unless otherwise dated)  MUSCLE LENGTH: Hamstrings: Right WFL deg; Left WFL deg     LUMBAR SPECIAL TESTS:  ASLR  improved with pelvic compression   FUNCTIONAL TESTS:  SLS good   GAIT: Distance walked: WLF     POSTURE:   lumbar lordosis, weight shift Rt in trunk   LUMBARAROM/PROM   A/PROM A/PROM  09/30/2021  Flexion 50%  Extension WFL  Right lateral flexion 80%  Left lateral flexion 75%  Right rotation 80%  Left rotation 75%   (Blank rows = not tested)   LE ROM:                         WFL     LE MMT:   MMT Right 09/30/2021 Left 09/30/2021  Hip flexion      Hip extension      Hip abduction 5/5 5/5  Hip adduction 5/5 5/5  Hip internal rotation               PELVIC MMT:   MMT   09/30/2021  Vaginal    Internal Anal Sphincter 4/5  External Anal Sphincter 3/5  Puborectalis 3/5  Diastasis Recti    (Blank rows = not tested)         PALPATION:   General  DRA above umbilicus bulging to the Rt                 External Perineal Exam scar tissue tight and TTP Rt>Lt; Rt transverse peroneal attachment is restricted                             Internal Pelvic Floor  - Rt side transverse peroneus and levators moderate TTP; Lt side mild TTP   TONE: High and slow to relax - only relaxing with cues to breathe   PROLAPSE: Yes mild throughout   TODAY'S TREATMENT  Treatment:11/05/21 Exercises  SLR with core engaged Rotation with breathing qped and one knee in front  Manual STM to gluteals and lumbar Sacral mobs to release left SIJ Trigger Point Dry-Needling  Treatment instructions: Expect mild to moderate muscle soreness. S/S of pneumothorax if dry needled over a lung field, and to seek immediate medical attention should they occur. Patient verbalized understanding of these instructions and education.  Patient Consent  Given: Yes Education handout provided: Previously provided Muscles treated: lumbar and gluteals bil Electrical stimulation performed: No Parameters: N/A Treatment response/outcome: increased soft tissue length and twitch   Treatment:10/22/21 Exercises  Happy baby Side bending with coccyx mob Rotation with breathing  Manual Patient confirms identification and approves physical therapist to perform internal soft tissue work  Internal STM to coccygeus and coccyx mobs internally Coccyx mobs in qped and seated Sacral mobs to release left SIJ Trigger Point Dry-Needling  Treatment instructions: Expect mild to moderate muscle soreness. S/S of pneumothorax if dry needled over a lung field, and to seek immediate medical attention should they occur. Patient verbalized understanding of these instructions and education.  Patient Consent Given: Yes Education handout provided: Previously provided Muscles treated: lumbar multifidi Electrical stimulation performed: No Parameters: N/A Treatment response/outcome: increased soft tissue length palpated  Nuero Re-ed Education and cues for coordination of breathing and pelvic floor muscle contracting and relaxing at appropriate times    Foam noodle massage Cat cow Threading with child pose Trigger Point Dry-Needling  Treatment instructions: Expect mild to moderate muscle soreness. S/S of pneumothorax if dry needled over a lung field, and to seek immediate medical attention should they occur. Patient verbalized understanding of these instructions and education.  Patient Consent Given: Yes Education handout provided: Yes Muscles treated: lumbar and thoracic multifidi and C7 Electrical stimulation performed: No Parameters: N/A Treatment response/outcome: increased muscle length palpated throughout STM: to thoracic and lumbar      PATIENT EDUCATION:  Education details: Access Code: [email or web address] Person educated: Patient Education method:  Explanation and Handouts Education comprehension: verbalized understanding     HOME EXERCISE PROGRAM: Access Code: [email or web address] URL: [email or web address] Date: 10/17/2021 Prepared by: Jari Favre  Exercises - Cat Cow  - 1 x daily - 7 x weekly - 3 sets - 10 reps - Child's Pose with Thread the Needle  - 1 x daily - 7 x weekly - 3 sets - 10 reps   ASSESSMENT:   CLINICAL IMPRESSION: Pt is making progress with bowel movement.  Pt still has a lot of tension in bil hip and core weakness.  Addressing this will benefit long term to maintain improvements with bowel movement.  Continue to address per POC.     OBJECTIVE IMPAIRMENTS decreased coordination, decreased endurance, increased fascial restrictions,  increased muscle spasms, impaired tone, postural dysfunction, and pain.    ACTIVITY LIMITATIONS community activity and incontinence disrupting all activities .    PERSONAL FACTORS 3+ comorbidities: IBS, kidney stones, ovarian cyst, 3rd deg tear  are also affecting patient's functional outcome.      REHAB POTENTIAL: Excellent   CLINICAL DECISION MAKING: Evolving/moderate complexity   EVALUATION COMPLEXITY: Moderate     GOALS: Goals reviewed with patient? Yes   SHORT TERM GOALS: Target date: 10/28/2021   Ind with Intial HEP  Baseline: Goal status: Met 11/05/21    2.  Ind with toieting techniques Baseline:  Goal status: Met   3.  Pt will report 20% or more decrease in pain during intercourse Baseline:  Goal status: Ongoing (has not done lately)     LONG TERM GOALS: Target date: 12/23/2021   Pt will be independent with advanced HEP to maintain improvements made throughout therapy   Baseline:  Goal status: INITIAL   2.  Pt will report 1 BM per day that is normal due to improved muscle tone and coordination with BMs.  Baseline:  Goal status:ongoing   3.  Pt will have 0/3 score of Marinoff scale  Baseline:  Goal status: INITIAL   4.  Pt will  report she is able to get through one whole week without fecal smearing Baseline:  Goal status: ongoing   PLAN: PT FREQUENCY: 1x/week   PT DURATION: 12 weeks   PLANNED INTERVENTIONS: Therapeutic exercises, Therapeutic activity, Neuromuscular re-education, Balance training, Gait training, Patient/Family education, Joint mobilization, Dry Needling, Electrical stimulation, Cryotherapy, Moist heat, Taping, Biofeedback, and Manual therapy   PLAN FOR NEXT SESSION: sacral mobs, DN lumbar and gluteals on the left side, core strength in supine - progress core strength as able     [employer], PT 11/05/2021, 8:53 AM

## 2021-11-05 ENCOUNTER — Ambulatory Visit: Payer: 59 | Admitting: Adult Health

## 2021-11-05 ENCOUNTER — Encounter: Payer: Self-pay | Admitting: Physical Therapy

## 2021-11-05 ENCOUNTER — Ambulatory Visit: Payer: 59 | Admitting: Physical Therapy

## 2021-11-05 DIAGNOSIS — M6281 Muscle weakness (generalized): Secondary | ICD-10-CM | POA: Diagnosis not present

## 2021-11-05 DIAGNOSIS — R252 Cramp and spasm: Secondary | ICD-10-CM

## 2021-11-05 DIAGNOSIS — M62838 Other muscle spasm: Secondary | ICD-10-CM

## 2021-11-15 ENCOUNTER — Ambulatory Visit (INDEPENDENT_AMBULATORY_CARE_PROVIDER_SITE_OTHER): Payer: 59 | Admitting: Obstetrics and Gynecology

## 2021-11-15 ENCOUNTER — Ambulatory Visit: Payer: 59 | Admitting: Obstetrics and Gynecology

## 2021-11-15 ENCOUNTER — Encounter: Payer: Self-pay | Admitting: Obstetrics and Gynecology

## 2021-11-15 VITALS — BP 108/73 | HR 85

## 2021-11-15 DIAGNOSIS — M62838 Other muscle spasm: Secondary | ICD-10-CM | POA: Diagnosis not present

## 2021-11-15 MED ORDER — TRIAMCINOLONE ACETONIDE 40 MG/ML IJ SUSP
40.0000 mg | Freq: Once | INTRAMUSCULAR | Status: AC
  Administered 2021-11-15: 40 mg via INTRAMUSCULAR

## 2021-11-15 MED ORDER — BUPIVACAINE HCL (PF) 0.25 % IJ SOLN
20.0000 mL | Freq: Once | INTRAMUSCULAR | Status: AC
  Administered 2021-11-15: 20 mL

## 2021-11-15 NOTE — Progress Notes (Signed)
Ms. ADALEENA MOOERS is a 36 y.o. female who presents for levator trigger point injection, series #4  Vitals:   11/15/21 0924  BP: 108/73  Pulse: 85    Pain has improved. She is continuing with physical therapy.   Indication(s): Levator spasm.   Informed Consent:  The alternatives, risks and benefits of the procedure were explained to the patient. Risks including, but not limited to discomfort, pain, bleeding, infection, injury to nearby structures, inability to perform the procedure, failure of the procedure were discussed.  All questions were answered and the patient elected to proceed.  Procedure:   The patient was positioned in dorsal lithotomy position.  The vaginal tissues were prepped with Hibiclens solution.  An injection of 10cc  of a mixture of 90% anesthetic (0.25% Bupivacaine) and 10% '40mg'$ /ml Triamcinolone acetomide (Kenalog) was performed in multiple in the bilateral levator muscles, a total of 5 injection sites.  Pressure was held over bleeding areas until good hemostasis was achieved.   The patient tolerated the procedure well with no apparent complications.  Plan:  - Since she has seen good improvement, can stop injections at this point unless pain returns.  - She will continue with PT  Return after PT for follow up  Jaquita Folds, MD

## 2021-11-18 NOTE — Therapy (Unsigned)
OUTPATIENT PHYSICAL THERAPY TREATMENT NOTE   Patient Name: Abigail Frank MRN: [id number] DOB:Nov 13, 1985, 36 y.o., female Today's Date: 11/19/2021  PCP: Haywood Pao, MD  REFERRING PROVIDER: Jaquita Folds  END OF SESSION:   PT End of Session - 11/19/21 0824     Visit Number 5    Date for PT Re-Evaluation 12/23/21    Authorization Type [employer]    PT Start Time 0802    PT Stop Time 0842    PT Time Calculation (min) 40 min    Activity Tolerance Patient tolerated treatment well    Behavior During Therapy [facility] for tasks assessed/performed                Past Medical History:  Diagnosis Date   Anxiety    Asthma    Bone spur    cervical L side; L side arm numbness intermittent    Depression    Diabetes in pregnancy    Diabetes type 2, controlled (HCC)    Elevated cholesterol with high triglycerides    Gestational diabetes    Hx of varicella    IBS (irritable bowel syndrome)    Kidney stones    followed by Dr. Tammi Klippel   Migraines    without aura   Ovarian cyst    Postpartum care following vaginal delivery (1/30) 07/15/2013   Preterm uterine contractions 06/10/2013   Past Surgical History:  Procedure Laterality Date   MOUTH SURGERY     wisdom teeth extractions   Patient Active Problem List   Diagnosis Date Noted   Obesity 07/01/2021   History of menorrhagia 07/01/2021   Fecal smearing 07/01/2021   Anxiety 07/01/2021   Diabetes type 2, controlled ([place]) 06/28/2021   Carpal tunnel syndrome of left wrist 12/26/2018   Chronic migraine without aura without status migrainosus, not intractable 10/09/2018    REFERRING DIAG: M38.177 (ICD-10-CM) - Levator spasm R15.9 (ICD-10-CM) - Incontinence of feces, unspecified fecal incontinence type  THERAPY DIAG:  Muscle weakness (generalized)  Other muscle spasm  PERTINENT HISTORY: IBS, kidney stones, ovarian cyst, 3rd deg tear  PRECAUTIONS: None  SUBJECTIVE: I have been feeling good overall.   Dr. Windy Canny said the muscle felt much better.    PAIN:  Are you having pain? No   OBJECTIVE: (objective measures completed at initial evaluation unless otherwise dated)  MUSCLE LENGTH: Hamstrings: Right WFL deg; Left WFL deg     LUMBAR SPECIAL TESTS:  ASLR  improved with pelvic compression   FUNCTIONAL TESTS:  SLS good   GAIT: Distance walked: WLF     POSTURE:   lumbar lordosis, weight shift Rt in trunk   LUMBARAROM/PROM   A/PROM A/PROM  09/30/2021  Flexion 50%  Extension WFL  Right lateral flexion 80%  Left lateral flexion 75%  Right rotation 80%  Left rotation 75%   (Blank rows = not tested)   LE ROM:                         WFL     LE MMT:   MMT Right 09/30/2021 Left 09/30/2021  Hip flexion      Hip extension      Hip abduction 5/5 5/5  Hip adduction 5/5 5/5  Hip internal rotation               PELVIC MMT:   MMT   09/30/2021  Vaginal    Internal Anal Sphincter 4/5  External Anal Sphincter 3/5  Puborectalis 3/5  Diastasis Recti    (Blank rows = not tested)         PALPATION:   General  DRA above umbilicus bulging to the Rt                 External Perineal Exam scar tissue tight and TTP Rt>Lt; Rt transverse peroneal attachment is restricted                             Internal Pelvic Floor  - Rt side transverse peroneus and levators moderate TTP; Lt side mild TTP   TONE: High and slow to relax - only relaxing with cues to breathe   PROLAPSE: Yes mild throughout   TODAY'S TREATMENT  Treatment:11/19/21 Exercises  SLR with core engaged Alt LE with UE overhead - 15x Alt LE with UE rotation presses - 15x Child pose with rotation; cat cow - 5x Wall slides - 10x 5 sec with transversus abdominus activation  Manual STM to lumbar and thoracic with cupping  Trigger Point Dry-Needling  Treatment instructions: Expect mild to moderate muscle soreness. S/S of pneumothorax if dry needled over a lung field, and to seek immediate medical attention  should they occur. Patient verbalized understanding of these instructions and education.  Patient Consent Given: Yes Education handout provided: Previously provided Muscles treated: lumbar and thoracic paraspinals Electrical stimulation performed: No Parameters: N/A Treatment response/outcome: increased soft tissue length and twitch  Treatment:11/05/21 Exercises  SLR with core engaged Rotation with breathing qped and one knee in front  Manual STM to gluteals and lumbar Sacral mobs to release left SIJ Trigger Point Dry-Needling  Treatment instructions: Expect mild to moderate muscle soreness. S/S of pneumothorax if dry needled over a lung field, and to seek immediate medical attention should they occur. Patient verbalized understanding of these instructions and education.  Patient Consent Given: Yes Education handout provided: Previously provided Muscles treated: lumbar and gluteals bil Electrical stimulation performed: No Parameters: N/A Treatment response/outcome: increased soft tissue length and twitch   Treatment:10/22/21 Exercises  Happy baby Side bending with coccyx mob Rotation with breathing  Manual Patient confirms identification and approves physical therapist to perform internal soft tissue work  Internal STM to coccygeus and coccyx mobs internally Coccyx mobs in qped and seated Sacral mobs to release left SIJ Trigger Point Dry-Needling  Treatment instructions: Expect mild to moderate muscle soreness. S/S of pneumothorax if dry needled over a lung field, and to seek immediate medical attention should they occur. Patient verbalized understanding of these instructions and education.  Patient Consent Given: Yes Education handout provided: Previously provided Muscles treated: lumbar multifidi Electrical stimulation performed: No Parameters: N/A Treatment response/outcome: increased soft tissue length palpated  Nuero Re-ed Education and cues for coordination of  breathing and pelvic floor muscle contracting and relaxing at appropriate times    Foam noodle massage Cat cow Threading with child pose Trigger Point Dry-Needling  Treatment instructions: Expect mild to moderate muscle soreness. S/S of pneumothorax if dry needled over a lung field, and to seek immediate medical attention should they occur. Patient verbalized understanding of these instructions and education.  Patient Consent Given: Yes Education handout provided: Yes Muscles treated: lumbar and thoracic multifidi and C7 Electrical stimulation performed: No Parameters: N/A Treatment response/outcome: increased muscle length palpated throughout STM: to thoracic and lumbar      PATIENT EDUCATION:  Education details: Access Code: [email or web address] Person educated: Patient Education method: Press photographer  comprehension: verbalized understanding     HOME EXERCISE PROGRAM: Access Code: [email or web address] URL: [email or web address] Date: 11/19/2021 Prepared by: Jari Favre  Exercises - Cat Cow  - 1 x daily - 7 x weekly - 3 sets - 10 reps - Child's Pose with Thread the Needle  - 1 x daily - 7 x weekly - 3 sets - 10 reps - Happy Baby with Pelvic Floor Lengthening  - 1 x daily - 7 x weekly - 1 sets - 3 reps - 30 hold - Quadruped Full Range Thoracic Rotation with Reach  - 1 x daily - 7 x weekly - 3 sets - 10 reps - Supine Pelvic Tilt with Straight Leg Raise  - 1 x daily - 7 x weekly - 3 sets - 10 reps - Standing Anti-Rotation Press with Anchored Resistance  - 1 x daily - 7 x weekly - 3 sets - 10 reps ASSESSMENT:   CLINICAL IMPRESSION: Pt is making progress with improved bowel movements and having less pain today.  Pt was able to progress the core exercises.  Added to HEP as seen in chart.  Pt needed cues for posture to activate her core in standing.  Pt will benefit from skilled PT to continue to work towards functional goals.     OBJECTIVE IMPAIRMENTS  decreased coordination, decreased endurance, increased fascial restrictions, increased muscle spasms, impaired tone, postural dysfunction, and pain.    ACTIVITY LIMITATIONS community activity and incontinence disrupting all activities .    PERSONAL FACTORS 3+ comorbidities: IBS, kidney stones, ovarian cyst, 3rd deg tear  are also affecting patient's functional outcome.      REHAB POTENTIAL: Excellent   CLINICAL DECISION MAKING: Evolving/moderate complexity   EVALUATION COMPLEXITY: Moderate     GOALS: Goals reviewed with patient? Yes   SHORT TERM GOALS: Target date: 10/28/2021   Ind with Intial HEP  Baseline: Goal status: Met 11/05/21    2.  Ind with toieting techniques Baseline:  Goal status: Met   3.  Pt will report 20% or more decrease in pain during intercourse Baseline:  Goal status: Ongoing (has not done lately)     LONG TERM GOALS: Target date: 12/23/2021   Pt will be independent with advanced HEP to maintain improvements made throughout therapy   Baseline:  Goal status: ongoing (11/19/21)    2.  Pt will report 1 BM per day that is normal due to improved muscle tone and coordination with BMs.  Baseline:  Goal status:ongoing   3.  Pt will have 0/3 score of Marinoff scale  Baseline: currently husband just had surgery (11/19/21)  Goal status: ongoing   4.  Pt will report she is able to get through one whole week without fecal smearing Baseline:  Goal status: ongoing   PLAN: PT FREQUENCY: 1x/week   PT DURATION: 12 weeks   PLANNED INTERVENTIONS: Therapeutic exercises, Therapeutic activity, Neuromuscular re-education, Balance training, Gait training, Patient/Family education, Joint mobilization, Dry Needling, Electrical stimulation, Cryotherapy, Moist heat, Taping, Biofeedback, and Manual therapy   PLAN FOR NEXT SESSION: sacral mobs sitting, DN lumbar and gluteals on the left side, core strength in half kneel and standing - progress core strength as able      [employer], PT 11/19/2021, 8:24 AM

## 2021-11-19 ENCOUNTER — Ambulatory Visit: Payer: 59 | Attending: Obstetrics and Gynecology | Admitting: Physical Therapy

## 2021-11-19 DIAGNOSIS — M6281 Muscle weakness (generalized): Secondary | ICD-10-CM | POA: Insufficient documentation

## 2021-11-19 DIAGNOSIS — M62838 Other muscle spasm: Secondary | ICD-10-CM | POA: Diagnosis not present

## 2021-11-22 ENCOUNTER — Ambulatory Visit: Payer: 59 | Admitting: Obstetrics and Gynecology

## 2021-11-29 ENCOUNTER — Ambulatory Visit: Payer: 59 | Admitting: Obstetrics and Gynecology

## 2021-11-30 ENCOUNTER — Other Ambulatory Visit (HOSPITAL_COMMUNITY): Payer: Self-pay

## 2021-12-02 ENCOUNTER — Other Ambulatory Visit (HOSPITAL_COMMUNITY): Payer: Self-pay

## 2021-12-02 NOTE — Therapy (Unsigned)
OUTPATIENT PHYSICAL THERAPY TREATMENT NOTE   Patient Name: Abigail Frank MRN: [id number] DOB:[birth year], 36 y.o., female Today's Date: 12/03/2021  PCP: Haywood Pao, MD  REFERRING PROVIDER: Jaquita Folds  END OF SESSION:   PT End of Session - 12/03/21 0814     Visit Number 6    Date for PT Re-Evaluation 12/23/21    Authorization Type [employer]    PT Start Time [phone]   late arrival   PT Stop Time 0846    PT Time Calculation (min) 38 min    Activity Tolerance Patient tolerated treatment well    Behavior During Therapy [facility] for tasks assessed/performed                 Past Medical History:  Diagnosis Date   Anxiety    Asthma    Bone spur    cervical L side; L side arm numbness intermittent    Depression    Diabetes in pregnancy    Diabetes type 2, controlled (HCC)    Elevated cholesterol with high triglycerides    Gestational diabetes    Hx of varicella    IBS (irritable bowel syndrome)    Kidney stones    followed by Dr. Tammi Klippel   Migraines    without aura   Ovarian cyst    Postpartum care following vaginal delivery (1/30) 07/15/2013   Preterm uterine contractions 06/10/2013   Past Surgical History:  Procedure Laterality Date   MOUTH SURGERY     wisdom teeth extractions   Patient Active Problem List   Diagnosis Date Noted   Obesity 07/01/2021   History of menorrhagia 07/01/2021   Fecal smearing 07/01/2021   Anxiety 07/01/2021   Diabetes type 2, controlled ([place]) 06/28/2021   Carpal tunnel syndrome of left wrist 12/26/2018   Chronic migraine without aura without status migrainosus, not intractable 10/09/2018    REFERRING DIAG: D22.025 (ICD-10-CM) - Levator spasm R15.9 (ICD-10-CM) - Incontinence of feces, unspecified fecal incontinence type  THERAPY DIAG:  Muscle weakness (generalized)  Other muscle spasm  PERTINENT HISTORY: IBS, kidney stones, ovarian cyst, 3rd deg tear  PRECAUTIONS: None  SUBJECTIVE: I am just having a  little difficulty emptying all the way.  Other than that I haven't had any pain or leakage.  My stool feels formed and no diarrhea  PAIN:  Are you having pain? No   OBJECTIVE: (objective measures completed at initial evaluation unless otherwise dated)  MUSCLE LENGTH: Hamstrings: Right WFL deg; Left WFL deg     LUMBAR SPECIAL TESTS:  ASLR  improved with pelvic compression   FUNCTIONAL TESTS:  SLS good   GAIT: Distance walked: WLF     POSTURE:   lumbar lordosis, weight shift Rt in trunk   LUMBARAROM/PROM   A/PROM A/PROM  09/30/2021  Flexion 50%  Extension WFL  Right lateral flexion 80%  Left lateral flexion 75%  Right rotation 80%  Left rotation 75%   (Blank rows = not tested)   LE ROM:                         WFL     LE MMT:   MMT Right 09/30/2021 Left 09/30/2021  Hip flexion      Hip extension      Hip abduction 5/5 5/5  Hip adduction 5/5 5/5  Hip internal rotation               PELVIC MMT:   MMT  09/30/2021  Vaginal    Internal Anal Sphincter 4/5  External Anal Sphincter 3/5  Puborectalis 3/5  Diastasis Recti    (Blank rows = not tested)         PALPATION:   General  DRA above umbilicus bulging to the Rt                 External Perineal Exam scar tissue tight and TTP Rt>Lt; Rt transverse peroneal attachment is restricted                             Internal Pelvic Floor  - Rt side transverse peroneus and levators moderate TTP; Lt side mild TTP   TONE: High and slow to relax - only relaxing with cues to breathe   PROLAPSE: Yes mild throughout   TODAY'S TREATMENT  Treatment:12/03/21 Exercises  Child pose - 1 min Side lying clam - red band - 20x Seated breathing and bulging Qped - kegel  Qped UE reach and LE reach - kegel   Neuro re-ed Did in seated - TC with breathing and bulging.  Needed cues to breathe and bulge  Treatment:11/19/21 Exercises  SLR with core engaged Alt LE with UE overhead - 15x Alt LE with UE rotation presses -  15x Child pose with rotation; cat cow - 5x Wall slides - 10x 5 sec with transversus abdominus activation  Manual STM to lumbar and thoracic with cupping  Trigger Point Dry-Needling  Treatment instructions: Expect mild to moderate muscle soreness. S/S of pneumothorax if dry needled over a lung field, and to seek immediate medical attention should they occur. Patient verbalized understanding of these instructions and education.  Patient Consent Given: Yes Education handout provided: Previously provided Muscles treated: lumbar and thoracic paraspinals Electrical stimulation performed: No Parameters: N/A Treatment response/outcome: increased soft tissue length and twitch  Treatment:11/05/21 Exercises  SLR with core engaged Rotation with breathing qped and one knee in front  Manual STM to gluteals and lumbar Sacral mobs to release left SIJ Trigger Point Dry-Needling  Treatment instructions: Expect mild to moderate muscle soreness. S/S of pneumothorax if dry needled over a lung field, and to seek immediate medical attention should they occur. Patient verbalized understanding of these instructions and education.  Patient Consent Given: Yes Education handout provided: Previously provided Muscles treated: lumbar and gluteals bil Electrical stimulation performed: No Parameters: N/A Treatment response/outcome: increased soft tissue length and twitch          PATIENT EDUCATION:  Education details: Access Code: [email or web address] Person educated: Patient Education method: Explanation and Handouts Education comprehension: verbalized understanding     HOME EXERCISE PROGRAM: Access Code: [email or web address] URL: [email or web address] Date: 12/03/2021 Prepared by: Jari Favre  Exercises - Cat Cow  - 1 x daily - 7 x weekly - 3 sets - 10 reps - Child's Pose with Thread the Needle  - 1 x daily - 7 x weekly - 3 sets - 10 reps - Happy Baby with Pelvic Floor Lengthening  - 1 x  daily - 7 x weekly - 1 sets - 3 reps - 30 hold - Quadruped Full Range Thoracic Rotation with Reach  - 1 x daily - 7 x weekly - 3 sets - 10 reps - Supine Pelvic Tilt with Straight Leg Raise  - 1 x daily - 7 x weekly - 3 sets - 10 reps - Standing Anti-Rotation Press with Anchored Resistance  - 1 x daily - 7 x weekly -  3 sets - 10 reps - Sidelying Hip Abduction  - 1 x daily - 7 x weekly - 3 sets - 8-10 reps - Clam with Resistance  - 1 x daily - 7 x weekly - 3 sets - 8-10 reps - Prone Pelvic Floor Contraction on Swiss Ball  - 1 x daily - 7 x weekly - 3 sets - 10 reps - 3-5 hold ASSESSMENT:   CLINICAL IMPRESSION: Pt is making progress with improved bowel movements and having no pain today.  Pt was able to progress the core exercises and add hip strengthening as well as anterior wall strength.  Exercises were added to HEP as seen in chart.  Pt needed cues for breathing with bulging initially.  Then she was also feeling the contract and relax correctly at the appropriate times and was able to do correctly on her own. Pt will benefit from skilled PT to continue to work towards functional goals.     OBJECTIVE IMPAIRMENTS decreased coordination, decreased endurance, increased fascial restrictions, increased muscle spasms, impaired tone, postural dysfunction, and pain.    ACTIVITY LIMITATIONS community activity and incontinence disrupting all activities .    PERSONAL FACTORS 3+ comorbidities: IBS, kidney stones, ovarian cyst, 3rd deg tear  are also affecting patient's functional outcome.      REHAB POTENTIAL: Excellent   CLINICAL DECISION MAKING: Evolving/moderate complexity   EVALUATION COMPLEXITY: Moderate     GOALS: Goals reviewed with patient? Yes   SHORT TERM GOALS: Target date: 10/28/2021   Ind with Intial HEP  Baseline: Goal status: Met 11/05/21    2.  Ind with toieting techniques Baseline:  Goal status: Met   3.  Pt will report 20% or more decrease in pain during  intercourse Baseline:  Goal status: Ongoing (has not done lately)     LONG TERM GOALS: Target date: 12/23/2021   Pt will be independent with advanced HEP to maintain improvements made throughout therapy   Baseline:  Goal status: ongoing (11/19/21)    2.  Pt will report 1 BM per day that is normal due to improved muscle tone and coordination with BMs.  Baseline:  Goal status:ongoing   3.  Pt will have 0/3 score of Marinoff scale  Baseline: currently husband just had surgery (11/19/21)  Goal status: ongoing   4.  Pt will report she is able to get through one whole week without fecal smearing Baseline: happened one time 12/03/21  Goal status: ongoing   PLAN: PT FREQUENCY: 1x/week   PT DURATION: 12 weeks   PLANNED INTERVENTIONS: Therapeutic exercises, Therapeutic activity, Neuromuscular re-education, Balance training, Gait training, Patient/Family education, Joint mobilization, Dry Needling, Electrical stimulation, Cryotherapy, Moist heat, Taping, Biofeedback, and Manual therapy   PLAN FOR NEXT SESSION: core strength in half kneel and standing - progress core and hip strength as able, anterior pelvic floor strength with core strength     Camillo Flaming Hadley [place], PT 12/03/2021, 8:14 AM

## 2021-12-03 ENCOUNTER — Encounter: Payer: Self-pay | Admitting: Physical Therapy

## 2021-12-03 ENCOUNTER — Ambulatory Visit: Payer: 59 | Admitting: Physical Therapy

## 2021-12-03 DIAGNOSIS — M6281 Muscle weakness (generalized): Secondary | ICD-10-CM | POA: Diagnosis not present

## 2021-12-03 DIAGNOSIS — M62838 Other muscle spasm: Secondary | ICD-10-CM

## 2021-12-06 ENCOUNTER — Ambulatory Visit: Payer: 59 | Admitting: Obstetrics and Gynecology

## 2021-12-10 ENCOUNTER — Ambulatory Visit: Payer: 59 | Admitting: Physical Therapy

## 2021-12-23 ENCOUNTER — Institutional Professional Consult (permissible substitution): Payer: 59 | Admitting: Neurology

## 2021-12-23 NOTE — Therapy (Unsigned)
OUTPATIENT PHYSICAL THERAPY TREATMENT NOTE   Patient Name: Abigail Frank MRN: [id number] DOB:Dec 22, 1985, 36 y.o., female Today's Date: 12/24/2021  PCP: Haywood Pao, MD  REFERRING PROVIDER: Jaquita Folds  END OF SESSION:   PT End of Session - 12/24/21 0855     Visit Number 7    Date for PT Re-Evaluation 12/23/21    Authorization Type [employer]    PT Start Time 0805    PT Stop Time 0845    PT Time Calculation (min) 40 min    Activity Tolerance Patient tolerated treatment well    Behavior During Therapy [facility] for tasks assessed/performed                  Past Medical History:  Diagnosis Date   Anxiety    Asthma    Bone spur    cervical L side; L side arm numbness intermittent    Depression    Diabetes in pregnancy    Diabetes type 2, controlled (HCC)    Elevated cholesterol with high triglycerides    Gestational diabetes    Hx of varicella    IBS (irritable bowel syndrome)    Kidney stones    followed by Dr. Tammi Klippel   Migraines    without aura   Ovarian cyst    Postpartum care following vaginal delivery (1/30) 07/15/2013   Preterm uterine contractions 06/10/2013   Past Surgical History:  Procedure Laterality Date   MOUTH SURGERY     wisdom teeth extractions   Patient Active Problem List   Diagnosis Date Noted   Obesity 07/01/2021   History of menorrhagia 07/01/2021   Fecal smearing 07/01/2021   Anxiety 07/01/2021   Diabetes type 2, controlled ([place]) 06/28/2021   Carpal tunnel syndrome of left wrist 12/26/2018   Chronic migraine without aura without status migrainosus, not intractable 10/09/2018    REFERRING DIAG: N02.725 (ICD-10-CM) - Levator spasm R15.9 (ICD-10-CM) - Incontinence of feces, unspecified fecal incontinence type  THERAPY DIAG:  No diagnosis found.  PERTINENT HISTORY: IBS, kidney stones, ovarian cyst, 3rd deg tear  PRECAUTIONS: None  SUBJECTIVE: I am still having difficulty emptying my bowels now.  I have  been taking stool softener.  No leakage, but the stool starts hard and then is softer.  PAIN:  Are you having pain? No   OBJECTIVE: (objective measures completed at initial evaluation unless otherwise dated)  MUSCLE LENGTH: Hamstrings: Right WFL deg; Left WFL deg     LUMBAR SPECIAL TESTS:  ASLR  improved with pelvic compression   FUNCTIONAL TESTS:  SLS good   GAIT: Distance walked: WLF     POSTURE:   lumbar lordosis, weight shift Rt in trunk   LUMBARAROM/PROM   A/PROM A/PROM  09/30/2021  Flexion 50%  Extension WFL  Right lateral flexion 80%  Left lateral flexion 75%  Right rotation 80%  Left rotation 75%   (Blank rows = not tested)   LE ROM:                         WFL     LE MMT:   MMT Right 09/30/2021 Left 09/30/2021  Hip flexion      Hip extension      Hip abduction 5/5 5/5  Hip adduction 5/5 5/5  Hip internal rotation               PELVIC MMT:   MMT   09/30/2021  Vaginal    Internal  Anal Sphincter 4/5  External Anal Sphincter 3/5  Puborectalis 3/5  Diastasis Recti    (Blank rows = not tested)         PALPATION:   General  DRA above umbilicus bulging to the Rt                 External Perineal Exam scar tissue tight and TTP Rt>Lt; Rt transverse peroneal attachment is restricted                             Internal Pelvic Floor  - Rt side transverse peroneus and levators moderate TTP; Lt side mild TTP   TONE: High and slow to relax - only relaxing with cues to breathe   PROLAPSE: Yes mild throughout   TODAY'S TREATMENT  Treatment:12/24/21 Exercises  Child pose - 1 min Rock pose Elevated on pillow doing happy baby Standing single leg pallof press and rotation Thoracic rotation standing at the wall Qped UE reach and LE reach - kegel Self care: toileting techniques with pressingon lumbar region to lengthen low back   Treatment:12/03/21 Exercises  Child pose - 1 min Side lying clam - red band - 20x Seated breathing and bulging Qped  - kegel  Qped UE reach and LE reach - kegel   Neuro re-ed Did in seated - TC with breathing and bulging.  Needed cues to breathe and bulge      PATIENT EDUCATION:  Education details: Access Code: [email or web address] Person educated: Patient Education method: Explanation and Handouts Education comprehension: verbalized understanding     HOME EXERCISE PROGRAM: Access Code: [email or web address] URL: [email or web address] Date: 12/03/2021 Prepared by: Jari Favre  Exercises - Cat Cow  - 1 x daily - 7 x weekly - 3 sets - 10 reps - Child's Pose with Thread the Needle  - 1 x daily - 7 x weekly - 3 sets - 10 reps - Happy Baby with Pelvic Floor Lengthening  - 1 x daily - 7 x weekly - 1 sets - 3 reps - 30 hold - Quadruped Full Range Thoracic Rotation with Reach  - 1 x daily - 7 x weekly - 3 sets - 10 reps - Supine Pelvic Tilt with Straight Leg Raise  - 1 x daily - 7 x weekly - 3 sets - 10 reps - Standing Anti-Rotation Press with Anchored Resistance  - 1 x daily - 7 x weekly - 3 sets - 10 reps - Sidelying Hip Abduction  - 1 x daily - 7 x weekly - 3 sets - 8-10 reps - Clam with Resistance  - 1 x daily - 7 x weekly - 3 sets - 8-10 reps - Prone Pelvic Floor Contraction on Swiss Ball  - 1 x daily - 7 x weekly - 3 sets - 10 reps - 3-5 hold ASSESSMENT:   CLINICAL IMPRESSION: Pt has met goals.  Today's session focused on adjustments to HEP and just continued strengthening . Pt is expected to continue to make progress with HEP.     OBJECTIVE IMPAIRMENTS decreased coordination, decreased endurance, increased fascial restrictions, increased muscle spasms, impaired tone, postural dysfunction, and pain.    ACTIVITY LIMITATIONS community activity and incontinence disrupting all activities .    PERSONAL FACTORS 3+ comorbidities: IBS, kidney stones, ovarian cyst, 3rd deg tear  are also affecting patient's functional outcome.      REHAB POTENTIAL: Excellent   CLINICAL DECISION MAKING:  Evolving/moderate complexity   EVALUATION COMPLEXITY: Moderate  GOALS: Goals reviewed with patient? Yes   SHORT TERM GOALS: Target date: 10/28/2021   Ind with Intial HEP  Baseline: Goal status: Met 11/05/21    2.  Ind with toieting techniques Baseline:  Goal status: Met   3.  Pt will report 20% or more decrease in pain during intercourse Baseline:  Goal status: Ongoing (has not done lately)     LONG TERM GOALS: Target date: 12/23/2021   Pt will be independent with advanced HEP to maintain improvements made throughout therapy   Baseline:  Goal status: Met    2.  Pt will report 1 BM per day that is normal due to improved muscle tone and coordination with BMs.  Baseline: I have one every day that is formed Goal status:Met   3.  Pt will have 0/3 score of Marinoff scale  Baseline: haven't had pain, but hard time reaching climax  Goal status: Met   4.  Pt will report she is able to get through one whole week without fecal smearing Baseline: not having leaking  Goal status: Met   PLAN: PT FREQUENCY: 1x/week   PT DURATION: 12 weeks   PLANNED INTERVENTIONS: Therapeutic exercises, Therapeutic activity, Neuromuscular re-education, Balance training, Gait training, Patient/Family education, Joint mobilization, Dry Needling, Electrical stimulation, Cryotherapy, Moist heat, Taping, Biofeedback, and Manual therapy   PLAN FOR NEXT SESSION: d/c today     Jule Ser, PT 12/24/2021, 8:55 AM   PHYSICAL THERAPY DISCHARGE SUMMARY  Visits from Start of Care: 7  Current functional level related to goals / functional outcomes:   See above Remaining deficits: See above   Education / Equipment: HEP   Patient agrees to discharge. Patient goals were met. Patient is being discharged due to meeting the stated rehab goals.  Gustavus Bryant, PT 12/24/21 8:55 AM

## 2021-12-24 ENCOUNTER — Ambulatory Visit: Payer: 59 | Attending: Obstetrics and Gynecology | Admitting: Physical Therapy

## 2021-12-24 DIAGNOSIS — M6281 Muscle weakness (generalized): Secondary | ICD-10-CM | POA: Diagnosis not present

## 2021-12-24 DIAGNOSIS — M62838 Other muscle spasm: Secondary | ICD-10-CM | POA: Insufficient documentation

## 2021-12-27 ENCOUNTER — Other Ambulatory Visit (HOSPITAL_COMMUNITY): Payer: Self-pay

## 2021-12-27 ENCOUNTER — Other Ambulatory Visit (HOSPITAL_BASED_OUTPATIENT_CLINIC_OR_DEPARTMENT_OTHER): Payer: Self-pay | Admitting: Obstetrics & Gynecology

## 2021-12-30 ENCOUNTER — Ambulatory Visit: Payer: 59 | Admitting: Neurology

## 2021-12-30 ENCOUNTER — Encounter: Payer: Self-pay | Admitting: Neurology

## 2021-12-30 VITALS — BP 109/71 | HR 81 | Ht 62.0 in | Wt 207.6 lb

## 2021-12-30 DIAGNOSIS — G4730 Sleep apnea, unspecified: Secondary | ICD-10-CM

## 2021-12-30 DIAGNOSIS — R0683 Snoring: Secondary | ICD-10-CM

## 2021-12-30 DIAGNOSIS — Z820 Family history of epilepsy and other diseases of the nervous system: Secondary | ICD-10-CM

## 2021-12-30 DIAGNOSIS — R519 Headache, unspecified: Secondary | ICD-10-CM | POA: Diagnosis not present

## 2021-12-30 DIAGNOSIS — R351 Nocturia: Secondary | ICD-10-CM

## 2021-12-30 DIAGNOSIS — G4719 Other hypersomnia: Secondary | ICD-10-CM | POA: Diagnosis not present

## 2021-12-30 DIAGNOSIS — E669 Obesity, unspecified: Secondary | ICD-10-CM | POA: Diagnosis not present

## 2021-12-30 NOTE — Progress Notes (Signed)
Subjective:    Patient ID: Abigail Frank is a 36 y.o. female.  HPI    Star Age, MD, PhD [facility] [address], [address] [place], [place] [address]  Dear Janett Billow and Berta Minor,   I saw your patient, Abigail Frank, upon your kind request in my sleep clinic today for initial consultation of her sleep disorder, in particular, concern for underlying obstructive sleep apnea.  The patient is unaccompanied today.  As you know, Abigail Frank is a 36 year old right-handed woman with an underlying medical history of migraine headaches, diabetes, hyperlipidemia, irritable bowel syndrome, history of kidney stones, anxiety, depression, asthma, and obesity, who reports snoring and excessive daytime somnolence as well as difficulty maintaining sleep at night.  When she cannot sleep at night she often eats a snack.  She lives with her family including husband and 2 children, ages 6 and 45.  Her 82-year-old daughter has epilepsy and also sleep disruption.  Patient has a white noise machine in her bedroom, she does have a TV in there but does not use it at night.  Bedtime is generally between 9 and 10 PM and rise time around 7 AM.  She has been working on weight loss and has lost altogether about 30 pounds but had some recent weight gain.  She works at [employer] as a Therapist, sports.  She works typically 2 shifts a week, from 9 AM to 9 PM.  She drinks caffeine in the form of coffee, 1 large cup per day, alcohol rarely, she is a non-smoker.  They have currently 5 indoor cats which they foster, they have 2 outdoor dogs.  She reports a family history of sleep apnea, her father has a CPAP machine and mom had a machine, mom passed away at age 36 from congestive heart failure.  Patient has woken up often with a headache, she does have degenerative neck disease and neck pain but often her morning headaches are migraine-like.  For as needed use, she takes Maxalt, baclofen, and  ibuprofen.  She takes Aimovig injections monthly for migraine prevention.  For sleep, she takes alprazolam low-dose at bedtime and sometimes she takes hydroxyzine for anxiety but it does make her drowsy.  I reviewed your office note from 10/24/2021.  Her Epworth sleepiness score is 9 out of 24, fatigue severity score is 34 out of 63.  Her Past Medical History Is Significant For: Past Medical History:  Diagnosis Date   Anxiety    Asthma    Bone spur    cervical L side; L side arm numbness intermittent    Depression    Diabetes in pregnancy    Diabetes type 2, controlled (HCC)    Elevated cholesterol with high triglycerides    Gestational diabetes    Hx of varicella    IBS (irritable bowel syndrome)    Kidney stones    followed by Dr. Tammi Klippel   Migraines    without aura   Ovarian cyst    Postpartum care following vaginal delivery (1/30) 07/15/2013   Preterm uterine contractions 06/10/2013    Her Past Surgical History Is Significant For: Past Surgical History:  Procedure Laterality Date   MOUTH SURGERY     wisdom teeth extractions    Her Family History Is Significant For: Family History  Problem Relation Age of Onset   Hypertension Mother    Hypothyroidism Mother    Heart attack Mother    Diabetes Mother    Heart disease  Mother        heart failure   Dementia Mother    Kidney disease Mother    Sleep apnea Mother    Hypertension Father    Hypothyroidism Father    Diabetes Father    Obesity Father    Sleep apnea Father    Cancer Maternal Grandmother        colon   Dementia Maternal Grandmother    Hypertension Maternal Grandmother    Early death Maternal Grandfather 36       ?pulmonary disease   Pulmonary fibrosis Maternal Grandfather    Seizures Child        thinks its genetic, only seizes if she hits her head,thinks its migraine related    Migraines Neg Hx     Her Social History Is Significant For: Social History   Socioeconomic History   Marital status:  Married    Spouse name: Not on file   Number of children: 2   Years of education: Not on file   Highest education level: Bachelor's degree (e.g., BA, AB, BS)  Occupational History   Not on file  Tobacco Use   Smoking status: Never   Smokeless tobacco: Never  Vaping Use   Vaping Use: Never used  Substance and Sexual Activity   Alcohol use: Yes    Comment: on occasion a glass of wine   Drug use: Never   Sexual activity: Yes    Birth control/protection: OCP  Other Topics Concern   Not on file  Social History Narrative   Lives at home with her family   Left handed   Caffeine: regular, at least 2 cups of coffee daily   Social Determinants of Health   Financial Resource Strain: Not on file  Food Insecurity: Not on file  Transportation Needs: Not on file  Physical Activity: Not on file  Stress: Not on file  Social Connections: Not on file    Her Allergies Are:  Allergies  Allergen Reactions   Bactrim [Sulfamethoxazole-Trimethoprim] Hives   Sulfa Antibiotics    Vicodin [Hydrocodone-Acetaminophen] Nausea And Vomiting  :   Her Current Medications Are:  Outpatient Encounter Medications as of 12/30/2021  Medication Sig   albuterol (PROVENTIL HFA;VENTOLIN HFA) 108 (90 BASE) MCG/ACT inhaler Inhale 2 puffs into the lungs every 6 (six) hours as needed for wheezing or shortness of breath.   ALPRAZolam (XANAX) 0.5 MG tablet Take 1 tablet by mouth twice a day as needed for anxiety   baclofen (LIORESAL) 10 MG tablet Take 1 tablet by mouth daily as needed for muscle spasms (acute migraine onset).   Blood Glucose Monitoring Suppl (FREESTYLE LITE) w/Device KIT Use to test blood sugars daily   citalopram (CELEXA) 40 MG tablet TAKE 1 TABLET BY MOUTH DAILY   Erenumab-aooe 140 MG/ML SOAJ INJECT 140 MG INTO THE SKIN EVERY 30 DAYS.   fluticasone furoate-vilanterol (BREO ELLIPTA) 200-25 MCG/ACT AEPB Inhale 1 puff into the lungs once a day.   gabapentin (NEURONTIN) 300 MG capsule TAKE 1 CAPSULE  BY MOUTH 2 TIMES DAILY.   glucose blood (FREESTYLE LITE) test strip Use to test blood sugars daily   hydrOXYzine (ATARAX) 25 MG tablet Take 1 tablet (25 mg total) by mouth at bedtime as needed.   ibuprofen (ADVIL) 600 MG tablet Take 600 mg by mouth every 6 (six) hours as needed.   Lancets (FREESTYLE) lancets Use to test blood sugars daily   MELATONIN PO Take 3 mg by mouth as needed.   metoCLOPramide (REGLAN)  10 MG tablet Take 1 tablet by mouth three times a day while having migraine or nausea.   norethindrone (MICRONOR) 0.35 MG tablet Take 1 tablet by mouth daily.   ondansetron (ZOFRAN-ODT) 4 MG disintegrating tablet Dissolve 1 tablet by mouth every 6 hours as needed for nausea or for headache.   rizatriptan (MAXALT-MLT) 10 MG disintegrating tablet DISSOLVE 1 TABLET BY MOUTH AS NEEDED FOR MIGRAINE, MAY REPEAT IN 2 HOURS IF NEEDED   Semaglutide,0.25 or 0.5MG/DOS, (OZEMPIC, 0.25 OR 0.5 MG/DOSE,) 2 MG/3ML SOPN Inject 0.60m under the skin once a week.   tiZANidine (ZANAFLEX) 2 MG tablet Take 1 tablet (2 mg total) by mouth every 6 (six) hours as needed for muscle spasms (migraine relief).   COVID-19 At Home Antigen Test ([facility]) KIT Use as directed within package instructions.   fluticasone (FLOVENT HFA) 110 MCG/ACT inhaler Inhale 1 puff into the lungs 2 (two) times daily.   indapamide (LOZOL) 1.25 MG tablet TAKE 1 TABLET BY MOUTH ONCE A DAY.   Facility-Administered Encounter Medications as of 12/30/2021  Medication   triamcinolone acetonide (KENALOG-40) injection 10 mg  :   Review of Systems:  Out of a complete 14 point review of systems, all are reviewed and negative with the exception of these symptoms as listed below:  Review of Systems  Neurological:        Pt here for sleep consult Pt snores,headaches,some fatigue. Pt states she doesn't sleep well at night pt states she wakes up every 2 to 3 hours nightly Pt denies hypertension, sleep study,and CPAP     ESS:9 FSS:34    Objective:  Neurological Exam  Physical Exam Physical Examination:   Vitals:   12/30/21 0827  BP: 109/71  Pulse: 81    General Examination: The patient is a very pleasant 36y.o. female in no acute distress. She appears well-developed and well-nourished and well groomed.   HEENT: Normocephalic, atraumatic, pupils are equal, round and reactive to light, extraocular tracking is good without limitation to gaze excursion or nystagmus noted. Hearing is grossly intact. Face is symmetric with normal facial animation. Speech is clear with no dysarthria noted. There is no hypophonia. There is no lip, neck/head, jaw or voice tremor. Neck is supple with full range of passive and active motion. There are no carotid bruits on auscultation. Oropharynx exam reveals: No significant mouth dryness, normal tongue movements, minimal overbite.  Neck circumference of 15 1/8 inches.    Chest: Clear to auscultation without wheezing, rhonchi or crackles noted.  Heart: S1+S2+0, regular and normal without murmurs, rubs or gallops noted.   Abdomen: Soft, non-tender and non-distended.  Extremities: There is no pitting edema in the distal lower extremities bilaterally.   Skin: Warm and dry without trophic changes noted.   Musculoskeletal: exam reveals no obvious joint deformities.   Neurologically:  Mental status: The patient is awake, alert and oriented in all 4 spheres. Her immediate and remote memory, attention, language skills and fund of knowledge are appropriate. There is no evidence of aphasia, agnosia, apraxia or anomia. Speech is clear with normal prosody and enunciation. Thought process is linear. Mood is normal and affect is normal.  Cranial nerves II - XII are as described above under HEENT exam.  Motor exam: Normal bulk, strength and tone is noted. There is no obvious tremor. Fine motor skills and coordination: grossly intact.  Cerebellar testing: No dysmetria or intention  tremor. There is no truncal or gait ataxia.  Sensory exam: intact to  light touch in the upper and lower extremities.  Gait, station and balance: She stands easily. No veering to one side is noted. No leaning to one side is noted. Posture is age-appropriate and stance is narrow based. Gait shows normal stride length and normal pace. No problems turning are noted.   Assessment and Plan:  In summary, Abigail Frank is a very pleasant 36 y.o.-year old female with an underlying medical history of migraine headaches, diabetes, hyperlipidemia, irritable bowel syndrome, history of kidney stones, anxiety, depression, asthma, and obesity, whose history and physical exam are concerning for sleep disordered breathing, supporting a current working diagnosis of unspecified sleep apnea, with the main differential diagnoses of obstructive sleep apnea (OSA) versus upper airway resistance syndrome (UARS) versus central sleep apnea (CSA), or mixed sleep apnea. A laboratory attended sleep study is considered gold standard for evaluation of sleep disordered breathing and is recommended at this time and clinically justified.   I had a long chat with the patient about my findings and the diagnosis of sleep apnea, particularly OSA, its prognosis and treatment options. We talked about medical/conservative treatments, surgical interventions and non-pharmacological approaches for symptom control. I explained, in particular, the risks and ramifications of untreated moderate to severe OSA, especially with respect to developing cardiovascular disease down the road, including congestive heart failure (CHF), difficult to treat hypertension, cardiac arrhythmias (particularly A-fib), neurovascular complications including TIA, stroke and dementia. Even type 2 diabetes has, in part, been linked to untreated OSA. Symptoms of untreated OSA may include (but may not be limited to) daytime sleepiness, nocturia (i.e. frequent nighttime  urination), memory problems, mood irritability and suboptimally controlled or worsening mood disorder such as depression and/or anxiety, lack of energy, lack of motivation, physical discomfort, as well as recurrent headaches, especially morning or nocturnal headaches. We talked about the importance of maintaining a healthy lifestyle and striving for healthy weight.  In addition, we talked about the importance of striving for and maintaining good sleep hygiene. I recommended the following at this time: sleep study.  I outlined the differences between a laboratory attended sleep study which is considered more comprehensive and accurate over the option of a home sleep test (HST); the latter may lead to underestimation of sleep disordered breathing in some instances and does not help with diagnosing upper airway resistance syndrome and is not accurate enough to diagnose primary central sleep apnea typically. I explained the different sleep test procedures to the patient in detail and also outlined possible surgical and non-surgical treatment options of OSA, including the use of a pressure airway pressure (PAP) device (ie CPAP, AutoPAP/APAP or BiPAP in certain circumstances), a custom-made dental device (aka oral appliance, which would require a referral to a specialist dentist or orthodontist typically, and is generally speaking not considered a good choice for patients with full dentures or edentulous state), upper airway surgical options, such as traditional UPPP (which is not considered a first-line treatment) or the Inspire device (hypoglossal nerve stimulator, which would involve a referral for consultation with an ENT surgeon, after careful selection, following inclusion criteria). I explained the PAP treatment option to the patient in detail, as this is generally considered first-line treatment.  The patient indicated that she would be willing to try PAP therapy, if the need arises. I explained the importance  of being compliant with PAP treatment, not only for insurance purposes but primarily to improve patient's symptoms symptoms, and for the patient's long term health benefit, including to reduce Her cardiovascular risks  longer-term.    We will pick up our discussion about the next steps and treatment options after testing.  We will keep her posted as to the test results by phone call and/or MyChart messaging where possible.  We will plan to follow-up in sleep clinic accordingly as well.  I answered all her questions today and the patient was in agreement.   I encouraged her to call with any interim questions, concerns, problems or updates or email [place] through [place].  Generally speaking, sleep test authorizations may take up to 2 weeks, sometimes less, sometimes longer, the patient is encouraged to get in touch with [place] if they do not hear back from the sleep lab staff directly within the next 2 weeks.  Thank you very much for allowing me to participate in the care of this nice patient. If I can be of any further assistance to you please do not hesitate to call me at [phone].  Sincerely,   Star Age, MD, PhD

## 2021-12-30 NOTE — Patient Instructions (Signed)

## 2021-12-31 ENCOUNTER — Other Ambulatory Visit (HOSPITAL_COMMUNITY): Payer: Self-pay

## 2021-12-31 MED ORDER — CITALOPRAM HYDROBROMIDE 40 MG PO TABS
ORAL_TABLET | Freq: Every day | ORAL | 0 refills | Status: DC
Start: 2021-12-31 — End: 2022-03-27
  Filled 2021-12-31: qty 90, 90d supply, fill #0

## 2022-01-13 ENCOUNTER — Telehealth: Payer: Self-pay

## 2022-01-13 NOTE — Telephone Encounter (Signed)
LVM for pt to call back to schedule sleep study.  

## 2022-01-16 ENCOUNTER — Other Ambulatory Visit (HOSPITAL_COMMUNITY): Payer: Self-pay

## 2022-01-16 MED ORDER — AMOXICILLIN-POT CLAVULANATE 875-125 MG PO TABS
ORAL_TABLET | ORAL | 0 refills | Status: DC
  Filled 2022-01-16: qty 14, 7d supply, fill #0

## 2022-01-21 NOTE — Telephone Encounter (Signed)
LVM for pt to call back to schedule sleep study.  

## 2022-01-28 NOTE — Telephone Encounter (Signed)
We have attempted to call the patient two times to schedule sleep study.  Patient has been unavailable at the phone numbers we have on file and has not returned our calls. If patient calls back we will schedule them for their sleep study.  

## 2022-01-29 ENCOUNTER — Other Ambulatory Visit (HOSPITAL_COMMUNITY): Payer: Self-pay

## 2022-01-29 ENCOUNTER — Other Ambulatory Visit: Payer: Self-pay | Admitting: Adult Health

## 2022-01-29 MED ORDER — RIZATRIPTAN BENZOATE 10 MG PO TBDP
ORAL_TABLET | ORAL | 4 refills | Status: DC
  Filled 2022-01-29: qty 9, 30d supply, fill #0
  Filled 2022-03-27: qty 9, 30d supply, fill #1
  Filled 2022-06-02: qty 9, 30d supply, fill #2
  Filled 2022-07-16: qty 9, 30d supply, fill #3
  Filled 2022-10-02: qty 9, 30d supply, fill #4

## 2022-01-29 NOTE — Telephone Encounter (Signed)
Patient called back.   She is scheduled at [facility] for 02/23/22 at 9 pm.  NPSG- [facility] no auth req ref # [id number]   Mailed packet to the patient.

## 2022-01-31 DIAGNOSIS — R7989 Other specified abnormal findings of blood chemistry: Secondary | ICD-10-CM | POA: Diagnosis not present

## 2022-01-31 DIAGNOSIS — F419 Anxiety disorder, unspecified: Secondary | ICD-10-CM | POA: Diagnosis not present

## 2022-01-31 DIAGNOSIS — E781 Pure hyperglyceridemia: Secondary | ICD-10-CM | POA: Diagnosis not present

## 2022-01-31 DIAGNOSIS — E119 Type 2 diabetes mellitus without complications: Secondary | ICD-10-CM | POA: Diagnosis not present

## 2022-02-05 DIAGNOSIS — Z0000 Encounter for general adult medical examination without abnormal findings: Secondary | ICD-10-CM | POA: Diagnosis not present

## 2022-02-05 DIAGNOSIS — Z1331 Encounter for screening for depression: Secondary | ICD-10-CM | POA: Diagnosis not present

## 2022-02-05 DIAGNOSIS — J4520 Mild intermittent asthma, uncomplicated: Secondary | ICD-10-CM | POA: Diagnosis not present

## 2022-02-05 DIAGNOSIS — F331 Major depressive disorder, recurrent, moderate: Secondary | ICD-10-CM | POA: Diagnosis not present

## 2022-02-05 DIAGNOSIS — E669 Obesity, unspecified: Secondary | ICD-10-CM | POA: Diagnosis not present

## 2022-02-05 DIAGNOSIS — Z8241 Family history of sudden cardiac death: Secondary | ICD-10-CM | POA: Diagnosis not present

## 2022-02-05 DIAGNOSIS — Z1339 Encounter for screening examination for other mental health and behavioral disorders: Secondary | ICD-10-CM | POA: Diagnosis not present

## 2022-02-05 DIAGNOSIS — G43909 Migraine, unspecified, not intractable, without status migrainosus: Secondary | ICD-10-CM | POA: Diagnosis not present

## 2022-02-05 DIAGNOSIS — R82998 Other abnormal findings in urine: Secondary | ICD-10-CM | POA: Diagnosis not present

## 2022-02-05 DIAGNOSIS — E781 Pure hyperglyceridemia: Secondary | ICD-10-CM | POA: Diagnosis not present

## 2022-02-05 DIAGNOSIS — F419 Anxiety disorder, unspecified: Secondary | ICD-10-CM | POA: Diagnosis not present

## 2022-02-05 DIAGNOSIS — E119 Type 2 diabetes mellitus without complications: Secondary | ICD-10-CM | POA: Diagnosis not present

## 2022-02-07 ENCOUNTER — Other Ambulatory Visit (HOSPITAL_COMMUNITY): Payer: Self-pay

## 2022-02-23 ENCOUNTER — Ambulatory Visit (INDEPENDENT_AMBULATORY_CARE_PROVIDER_SITE_OTHER): Payer: 59 | Admitting: Neurology

## 2022-02-23 DIAGNOSIS — G4719 Other hypersomnia: Secondary | ICD-10-CM

## 2022-02-23 DIAGNOSIS — R351 Nocturia: Secondary | ICD-10-CM

## 2022-02-23 DIAGNOSIS — G4733 Obstructive sleep apnea (adult) (pediatric): Secondary | ICD-10-CM | POA: Diagnosis not present

## 2022-02-23 DIAGNOSIS — R519 Headache, unspecified: Secondary | ICD-10-CM

## 2022-02-23 DIAGNOSIS — G4730 Sleep apnea, unspecified: Secondary | ICD-10-CM

## 2022-02-23 DIAGNOSIS — Z820 Family history of epilepsy and other diseases of the nervous system: Secondary | ICD-10-CM

## 2022-02-23 DIAGNOSIS — R0683 Snoring: Secondary | ICD-10-CM

## 2022-02-23 DIAGNOSIS — E669 Obesity, unspecified: Secondary | ICD-10-CM

## 2022-02-23 DIAGNOSIS — G4720 Circadian rhythm sleep disorder, unspecified type: Secondary | ICD-10-CM

## 2022-02-25 ENCOUNTER — Other Ambulatory Visit: Payer: Self-pay | Admitting: Adult Health

## 2022-02-25 ENCOUNTER — Other Ambulatory Visit (HOSPITAL_COMMUNITY): Payer: Self-pay

## 2022-02-25 MED ORDER — ALPRAZOLAM 0.5 MG PO TABS
ORAL_TABLET | ORAL | 3 refills | Status: DC
  Filled 2022-02-25: qty 30, 15d supply, fill #0
  Filled 2022-03-27: qty 30, 15d supply, fill #1
  Filled 2022-05-16: qty 30, 15d supply, fill #2
  Filled 2022-07-16: qty 30, 15d supply, fill #3

## 2022-02-26 ENCOUNTER — Other Ambulatory Visit (HOSPITAL_COMMUNITY): Payer: Self-pay

## 2022-03-03 NOTE — Procedures (Signed)
[facility] POLYSOMNOGRAPHY  INTERPRETATION REPORT   STUDY DATE:  02/23/2022     PATIENT NAME:  Abigail Frank         DATE OF BIRTH:  August 30, 1985  PATIENT ID:  [id number]    TYPE OF STUDY:  PSG  READING PHYSICIAN: Star Age, MD, PhD REFERRED BY: Frann Rider, NP SCORING TECHNICIAN: Forde Radon, RPSGT  HISTORY: 36 year old right-handed woman with an underlying medical history of migraine headaches, diabetes, hyperlipidemia, irritable bowel syndrome, history of kidney stones, anxiety, depression, asthma, and obesity, who reports snoring and excessive daytime somnolence as well as difficulty maintaining sleep at night. Her Epworth sleepiness score is 9 out of 24, fatigue severity score is 34 out of 63. Height: 62 in Weight: 207 lb (BMI 37) Neck Size: 15 in. MEDICATIONS: Albuterol, Xanax, Lioresal, Erenumab, Breo Ellilpta, Flovent HFA, Neurontin, Atarax, Advil, Melatonin, Reglan, Micronor, Zofran, Maxalt, Ozempic, Zanaflex, Kenalog - 40 TECHNICAL DESCRIPTION: A registered sleep technologist was in attendance for the duration of the recording.  Data collection, scoring, video monitoring, and reporting were performed in compliance with the AASM Manual for the Scoring of Sleep and Associated Events; (Hypopnea is scored based on the criteria listed in Section VIII D. 1b in the AASM Manual V2.6 using a 4% oxygen desaturation rule or Hypopnea is scored based on the criteria listed in Section VIII D. 1a in the AASM Manual V2.6 using 3% oxygen desaturation and /or arousal rule).   SLEEP CONTINUITY AND SLEEP ARCHITECTURE:  Lights-out was at 21:26: and lights-on at  05:07: with a total recording time of 7 hours, and 41.5 minutes. Total sleep time ( TST) was 364.5 minutes with a decreased sleep efficiency at 79.0%.   BODY POSITION:  TST was divided  between the following sleep positions: 39.6% supine;  15.2% lateral;  45% prone. Duration of total sleep and percent of  total sleep in their respective position is as follows: supine 144 minutes (40%), non-supine 220 minutes (60%); right 00 minutes (0%), left 55 minutes (15%), and prone 164 minutes (45%).  Total supine REM sleep time was 39 minutes (75% of total REM sleep). Sleep latency was delayed at 43.5 minutes.  REM sleep latency was increased mildly at 122.0 minutes. Of the total sleep time, the percentage of stage N1 sleep was 2.9%, stage N2 sleep was 83%, which is markedly increaed, stage N3 sleep was absent, and REM sleep was 14.4%, which is reduced. There were 2 Stage R periods observed on this study night, 18 awakenings (i.e. transitions to Stage W from any sleep stage), and 46 total stage transitions. Wake after sleep onset (WASO) time accounted for 53.5 minutes with mild sleep fragmentation noted.  RESPIRATORY MONITORING:   Based on CMS criteria (using a 4% oxygen desaturation rule for scoring hypopneas), there were 0 apneas (0 obstructive; 0 central; 0 mixed), and 82 hypopneas.  Apnea index was 0.0. Hypopnea index was 13.5. The apnea-hypopnea index was 13.5 overall (14.9 supine, 23 non-supine; 18.3 REM, 16.7 supine REM).  There were 0 respiratory effort-related arousals (RERAs).  The RERA index was 0 events/h. Total respiratory disturbance index (RDI) was 13.5 events/h. RDI results showed: supine RDI  14.9 /h; non-supine RDI 12.5 /h; REM RDI 18.3 /h, supine REM RDI 16.7 /h.   Based on AASM criteria (using a 3% oxygen desaturation and /or arousal rule for scoring hypopneas), there were 0 apneas (0 obstructive; 0 central; 0 mixed), and 113 hypopneas. Apnea index was 0.0. Hypopnea index was 18.6.  The apnea-hypopnea index was 18.6/hour overall (18.3 supine, 51 non-supine; 26.3 REM, 18.2 supine REM).  There were 0 respiratory effort-related arousals (RERAs).  The RERA index was 0 events/h. Total respiratory disturbance index (RDI) was 18.6 events/h. RDI results showed: supine RDI  18.3 /h; non-supine RDI 18.8 /h;  REM RDI 26.3 /h, supine REM RDI 18.2 /h.  OXIMETRY: Oxyhemoglobin Saturation Nadir during sleep was at  75%) from a mean of 95%.  Of the Total sleep time (TST)   hypoxemia (=<88%) was present for  1.0 minutes, or 0.3% of total sleep time.  LIMB MOVEMENTS: There were 0 periodic limb movements of sleep (0.0/hr), of which 0 (0.0/hr) were associated with an arousal. AROUSAL: There were 69 arousals in total, for an arousal index of 11 arousals/hour.  Of these, 28 were identified as respiratory-related arousals (5 /h), 0 were PLM-related arousals (0 /h), and 53 were non-specific arousals (9 /h). Snoring was classified as mild and intermittent. EEG:   The EEG was of normal amplitude and frequency, with symmetric manifestation of sleep stages. EKG: The electrocardiogram showed normal sinus rhythm (NSR).  The average heart rate during sleep was 78 bpm.  The heart rate during sleep varied between a minimum of Tachycardia and  a maximum of  94 bpm. AUDIO and VIDEO: The video and audio analysis did not show any abnormal or unusual behaviors, movements, phonations or vocalizations. Post study, the patient indicated, that sleep was the same as usual. IMPRESSION:? Obstructive sleep apnea (OSA) Dysfunctions associated with sleep stages or arousal from sleep  RECOMMENDATIONS:   1. This study demonstrates moderate obstructive sleep apnea, with a total AHI of 18.6/hour, and O2 nadir of 75% with mild, intermittent snoring detected. Treatment with positive airway pressure is recommended; this can be achieved in the form of autoPAP. Alternatively, a full-night CPAP titration study would allow optimization of therapy if needed. Other treatment options may include avoidance of supine sleep position along with weight loss, or the use of an oral appliance in selected patients. Please note, that untreated obstructive sleep apnea may carry additional perioperative morbidity. Patients with significant obstructive sleep apnea  should receive perioperative PAP therapy and the surgeons and particularly the anesthesiologist should be informed of the diagnosis and the severity of the sleep disordered breathing. 2. This study shows mild sleep fragmentation and abnormal sleep stage percentages; these are nonspecific findings and per se do not signify an intrinsic sleep disorder or a cause for the patient's sleep-related symptoms. Causes include (but are not limited to) the first night effect of the sleep study, circadian rhythm disturbances, medication effect or an underlying mood disorder or medical problem.  3. The patient should be cautioned not to drive, work at heights, or operate dangerous or heavy equipment when tired or sleepy. Review and reiteration of good sleep hygiene measures should be pursued with any patient. 4. The patient will be seen in follow-up by Dr. Rexene Alberts at [facility] for discussion of the test results and further management strategies. The referring provider will be notified of the test results.   I certify that I have reviewed the entire raw data recording prior to the issuance of this report in accordance with the Standards of Accreditation of the American Academy of Sleep Medicine (AASM).  Star Age,  MD, PhD

## 2022-03-03 NOTE — Addendum Note (Signed)
Addended by: Star Age on: 03/03/2022 07:00 PM   Modules accepted: Orders

## 2022-03-04 ENCOUNTER — Telehealth: Payer: Self-pay | Admitting: *Deleted

## 2022-03-04 ENCOUNTER — Encounter: Payer: Self-pay | Admitting: *Deleted

## 2022-03-04 NOTE — Telephone Encounter (Signed)
I called pt. I advised pt that Dr. Rexene Alberts reviewed their sleep study results and found that pt has moderate OSA. Dr. Rexene Alberts recommends that pt start autopap. I reviewed PAP compliance expectations with the pt. Pt is agreeable to starting an auto-PAP. I advised pt that an order will be sent to a DME, [employer], and they will call the pt within about one week after they file with the pt's insurance. They will show the pt how to use the machine, fit for masks, and troubleshoot the auto-PAP if needed. A follow up appt was made for insurance purposes with Frann Rider, NP on 05/12/2022 at 1245. Pt verbalized understanding to arrive 15 minutes early and bring their auto-PAP. A letter with all of this information in it will be mailed to the pt as a reminder. I verified with the pt that the address we have on file is correct. Pt verbalized understanding of results. Pt had no questions at this time but was encouraged to call back if questions arise. I have sent the order to [facility] and have received confirmation that they have received the order.

## 2022-03-04 NOTE — Telephone Encounter (Signed)
-----   Message from Star Age, MD sent at 03/03/2022  7:00 PM EDT ----- Patient referred by Frann Rider, NP, seen by me on 12/30/21, diagnostic PSG on 02/23/22.    Please call and notify the patient that the recent sleep study showed overall moderate obstructive sleep apnea. I therefore, recommend treatment for this in the form of autoPAP, which means, that we don't have to bring her  back for a second sleep study with CPAP, but will let him try an autoPAP machine at home, through a DME company (of her choice, or as per insurance requirement). The DME representative will educate her on how to use the machine, how to put the mask on, etc. I have placed an order in the chart. Please send referral, talk to patient, send report to referring MD. We will need a FU in sleep clinic for 10 weeks post-PAP set up, please arrange that with me or one of our NPs. Thanks,   Star Age, MD, PhD [facility])

## 2022-03-05 ENCOUNTER — Other Ambulatory Visit (HOSPITAL_COMMUNITY): Payer: Self-pay

## 2022-03-06 NOTE — Telephone Encounter (Signed)
Received fax back from [place] not able to accept referral (do not take [employer]).  Will go ahead with [facility].

## 2022-03-06 NOTE — Telephone Encounter (Signed)
New, Willodean Rosenthal, RN; Redmond Pulling, Clovis Riley Received, Thank you!      Previous Messages    ----- Message -----  From: Brandon Melnick, RN  Sent: 03/06/2022   8:03 AM EDT  To: Darlina Guys; Miquel Dunn; Stephannie Peters; *  Subject: new cpap user                                   New order in Epic.   Willette Pa  Female, 36 y.o., 06-Dec-1985  MRN:  [id number]  Phone:  [phone]  [facility]

## 2022-03-27 ENCOUNTER — Other Ambulatory Visit: Payer: Self-pay | Admitting: Adult Health

## 2022-03-27 ENCOUNTER — Other Ambulatory Visit (HOSPITAL_BASED_OUTPATIENT_CLINIC_OR_DEPARTMENT_OTHER): Payer: Self-pay | Admitting: Obstetrics & Gynecology

## 2022-03-27 ENCOUNTER — Other Ambulatory Visit (HOSPITAL_COMMUNITY): Payer: Self-pay

## 2022-03-27 DIAGNOSIS — F419 Anxiety disorder, unspecified: Secondary | ICD-10-CM

## 2022-03-27 MED ORDER — CITALOPRAM HYDROBROMIDE 40 MG PO TABS
40.0000 mg | ORAL_TABLET | Freq: Every day | ORAL | 0 refills | Status: DC
  Filled 2022-03-27: qty 90, 90d supply, fill #0

## 2022-03-27 MED ORDER — HYDROXYZINE HCL 25 MG PO TABS
25.0000 mg | ORAL_TABLET | Freq: Every evening | ORAL | 1 refills | Status: DC | PRN
  Filled 2022-03-27: qty 30, 30d supply, fill #0
  Filled 2022-11-20: qty 30, 30d supply, fill #1

## 2022-03-27 MED ORDER — GABAPENTIN 300 MG PO CAPS
300.0000 mg | ORAL_CAPSULE | Freq: Two times a day (BID) | ORAL | 0 refills | Status: DC
  Filled 2022-03-27: qty 180, 90d supply, fill #0

## 2022-03-28 ENCOUNTER — Other Ambulatory Visit (HOSPITAL_COMMUNITY): Payer: Self-pay

## 2022-03-28 DIAGNOSIS — G4733 Obstructive sleep apnea (adult) (pediatric): Secondary | ICD-10-CM | POA: Diagnosis not present

## 2022-03-31 ENCOUNTER — Other Ambulatory Visit (HOSPITAL_COMMUNITY): Payer: Self-pay

## 2022-04-10 ENCOUNTER — Other Ambulatory Visit (HOSPITAL_COMMUNITY): Payer: Self-pay

## 2022-04-10 DIAGNOSIS — G4733 Obstructive sleep apnea (adult) (pediatric): Secondary | ICD-10-CM | POA: Diagnosis not present

## 2022-04-10 MED ORDER — OZEMPIC (0.25 OR 0.5 MG/DOSE) 2 MG/3ML ~~LOC~~ SOPN
0.5000 mg | PEN_INJECTOR | SUBCUTANEOUS | 2 refills | Status: DC
  Filled 2022-04-10: qty 3, 28d supply, fill #0
  Filled 2022-06-02: qty 3, 28d supply, fill #1
  Filled 2022-07-16: qty 3, 28d supply, fill #2

## 2022-04-16 ENCOUNTER — Other Ambulatory Visit (HOSPITAL_COMMUNITY): Payer: Self-pay

## 2022-04-18 ENCOUNTER — Other Ambulatory Visit (HOSPITAL_COMMUNITY): Payer: Self-pay

## 2022-04-26 DIAGNOSIS — E119 Type 2 diabetes mellitus without complications: Secondary | ICD-10-CM | POA: Diagnosis not present

## 2022-04-26 DIAGNOSIS — H5213 Myopia, bilateral: Secondary | ICD-10-CM | POA: Diagnosis not present

## 2022-04-28 DIAGNOSIS — G4733 Obstructive sleep apnea (adult) (pediatric): Secondary | ICD-10-CM | POA: Diagnosis not present

## 2022-04-30 ENCOUNTER — Other Ambulatory Visit (HOSPITAL_COMMUNITY): Payer: Self-pay

## 2022-05-07 NOTE — Progress Notes (Deleted)
[facility]    Provider:  Dr Jaynee Eagles Requesting Provider: Tisovec, Fransico Him, MD Primary Care Provider:  Haywood Pao, MD  CC:  No chief complaint on file.     HPI:  Update 05/12/2022 Abigail Frank: Patient returns for initial CPAP follow-up visit.  Completed sleep study 02/23/2022 which showed moderate OSA with total AHI of 18.6/h and O2 nadir 75%.  Recommend initiation of AutoPap which was started on 03/28/2022.       History provided for reference for this is only Update 10/24/2021 Abigail Frank: Patient returns for 1 year migraine follow-up.  Overall doing well.  Migraines remain well controlled on Aimovig. She has been experiencing a migraine headache about 1x/week typically upon awakening over the past 6 months.  Previously, would experience about 1 migraine per week but randomly throughout the day.  Pain remains behind right eye and right occipital area.  Will take a rizatriptan and fall asleep for another hour and typically migraine will be resolved.  If severe, will take Zofran and baclofen in addition to rizatriptan with resolution.  Upon further questioning, she does admit to insomnia waking up frequently throughout the night and difficulty falling asleep, fatigue during the day with naps, and snores.  She has not previously underwent sleep study.  She is currently working on weight loss and has lost about 30 pounds since prior visit.  No further concerns at this time.  Update 11/05/2020 Abigail Frank: Abigail Frank returns for yearly migraine follow-up  Migraines have been well controlled with monthly use of Aimovig.  She has only been experiencing migraines during her monthly cycle and with increased neck pain.  Reports her PCP recently referred her to [facility] for a bone spur at C2-3 and possible nerve ablation per patient-initial evaluation next week.  Use of Maxalt with some benefit but usually will take up to 2 hours to take effect.  Trialed Nurtec with quick  resolution when able to catch early enough. Will also use Zofran and baclofen with benefit.  Will occasionally use tizadine and Reglan but does cause excessive fatigue.   Update 11/03/2019 Abigail Frank: Abigail Frank returns for migraine follow-up.  Current migraine treatment plan includes  Prevention: Aimovig 140 mg SQ monthly, Emergent as needed: ubrelvy 50 mg as needed and baclofen; if severe unable to sleep, rizatriptan 10 mg and tizanidine 4 mg.  Zofran 4 mg as needed for nausea  Reports severe migraine in 05/2019 with typical migraine onset and location but accompanied by slurred speech, right arm heaviness and unsteadiness.  She has not previously experienced the symptoms.  Pain rated 10/10.  She ended up falling asleep after taking rizatriptan and tizanidine and after awakening 4 hours later, symptoms resolved including migraine but did continue to feel spaced out sensation which is typical for her.  She has not experienced recurrent neurological symptoms since that time.  During the month of March and April, she was experiencing migraines 3-4 times weekly lasting 4 to 6 hours without benefit of emergent medications.  She does admit to missing Aimovig injections in January and February due to home stressors.  Restarted monthly Aimovig in March and migraines have greatly improved since that time.  She does endorse ongoing bilateral neck pain and feels as though this contributes to migraine onset.  She has trialed massage in the past without great benefit.   Interval history 11/24/2018 Dr. Jaynee Eagles:  Follow up for migraines and new symptom hand pain.  Patient reports for several years she has  had hand pain numbness and tingling mostly in digits 1 through 3 more so on the left but also on the right.  She wakes up in the middle of the night and has to shake her hands out.  She had an MRI of the cervical spine in 2017 due to the symptoms but did not show etiology.  It is worsening.  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  Positive Phalen's sign.  I discussed carpal tunnel syndrome with patient and we will have her in for an EMG nerve conduction study.  She is doing amazing on Aimovig she loves it continue.  Personally reviewed MRI of th cervical spine and agree with the following 2017:   IMPRESSION: Negative for central canal stenosis. Uncovertebral spurring results in some foraminal narrowing on the left appearing most notable at C3-4 with a very mild degree of foraminal narrowing on the left at C4-5 and C5-6.   Initial consult visit 10/07/2018 Dr. Jaynee Eagles:  Abigail Frank is a 36 y.o. female here as requested by Tisovec, Fransico Him, MD for migraines. PMHx migraine. No family history that she knows of, her mother is deceased. Struggling for years. Started during her menses as a teenager and have worsened over the years. Worse with life stressors. At least 2-3 migraines. She has 25 headache days a month. At least 12 migraine days a month that can be moderately severe or severe. Pounding/pulsating, interrupts her daily life, she has to lay down, turn off the light, nausea, no vomiting, +photo/phonophbia, Can last 24-72 hours. No medication overuse. No aura. Ongoing for over a year. Migraines starts as tension in the neck and creep up the back of her neck, pins and needles, to the right and then all over, pain behind the right eye, right side is the worse side. They can be positional. She wakes with them in the morning. Her left arm goes numb, tried a steroid pack. She follows with an orthopaedist for her neck. Positions of her head can cause the headaches. Neck pain and tightness. No other focal neurologic deficits, associated symptoms, inciting events or modifiable factors.  Meds tried: zoloft. Topamax(made her "crazy"), ubrelvy, baclofen, imitrex..      Review of Systems: Patient complains of symptoms per HPI as well as the following symptoms: Headaches.  Pertinent negatives and positives per HPI.  All others negative.   Social History   Socioeconomic History   Marital status: Married    Spouse name: Not on file   Number of children: 2   Years of education: Not on file   Highest education level: Bachelor's degree (e.g., BA, AB, BS)  Occupational History   Not on file  Tobacco Use   Smoking status: Never   Smokeless tobacco: Never  Vaping Use   Vaping Use: Never used  Substance and Sexual Activity   Alcohol use: Yes    Comment: on occasion a glass of wine   Drug use: Never   Sexual activity: Yes    Birth control/protection: OCP  Other Topics Concern   Not on file  Social History Narrative   Lives at home with her family   Left handed   Caffeine: regular, at least 2 cups of coffee daily   Social Determinants of Health   Financial Resource Strain: Not on file  Food Insecurity: Not on file  Transportation Needs: Not on file  Physical Activity: Not on file  Stress: Not on file  Social Connections: Not on file  Intimate Partner Violence: Not on file    Family History  Problem Relation Age of Onset   Hypertension Mother    Hypothyroidism Mother    Heart attack Mother    Diabetes Mother    Heart disease Mother        heart failure   Dementia Mother    Kidney disease Mother    Sleep apnea Mother    Hypertension Father    Hypothyroidism Father    Diabetes Father    Obesity Father    Sleep apnea Father    Cancer Maternal Grandmother        colon   Dementia Maternal Grandmother    Hypertension Maternal Grandmother    Early death Maternal Grandfather 54       ?pulmonary disease   Pulmonary fibrosis Maternal Grandfather    Seizures Child        thinks its genetic, only seizes if she hits her head,thinks its migraine related    Migraines Neg Hx     Past Medical History:  Diagnosis Date   Anxiety    Asthma    Bone spur    cervical L side; L side arm numbness intermittent    Depression    Diabetes in pregnancy    Diabetes type 2, controlled ([facility])     Elevated cholesterol with high triglycerides    Gestational diabetes    Hx of varicella    IBS (irritable bowel syndrome)    Kidney stones    followed by Dr. Tammi Klippel   Migraines    without aura   Ovarian cyst    Postpartum care following vaginal delivery (1/30) 07/15/2013   Preterm uterine contractions 06/10/2013    Patient Active Problem List   Diagnosis Date Noted   Obesity 07/01/2021   History of menorrhagia 07/01/2021   Fecal smearing 07/01/2021   Anxiety 07/01/2021   Diabetes type 2, controlled ([facility]) 06/28/2021   Carpal tunnel syndrome of left wrist 12/26/2018   Chronic migraine without aura without status migrainosus, not intractable 10/09/2018    Past Surgical History:  Procedure Laterality Date   MOUTH SURGERY     wisdom teeth extractions    Current Outpatient Medications  Medication Sig Dispense Refill   albuterol (PROVENTIL HFA;VENTOLIN HFA) 108 (90 BASE) MCG/ACT inhaler Inhale 2 puffs into the lungs every 6 (six) hours as needed for wheezing or shortness of breath.     ALPRAZolam (XANAX) 0.5 MG tablet Take 1 tablet by mouth twice a day as needed for anxiety 30 tablet 3   amoxicillin-clavulanate (AUGMENTIN) 875-125 MG tablet Take 1 tablet by mouth 2 times a day for 1 week 14 tablet 0   baclofen (LIORESAL) 10 MG tablet Take 1 tablet (10 mg total) by mouth daily as needed for muscle spasms (acute migraine onset). 30 tablet 5   Blood Glucose Monitoring Suppl (FREESTYLE LITE) w/Device KIT Use to test blood sugars daily 1 kit 0   citalopram (CELEXA) 40 MG tablet Take 1 tablet (40 mg total) by mouth daily. 90 tablet 0   COVID-19 At Home Antigen Test (CARESTART COVID-19 HOME TEST) KIT Use as directed within package instructions. 4 each 0   Erenumab-aooe 140 MG/ML SOAJ INJECT 140 MG (1 pen) INTO THE SKIN EVERY 30 DAYS. 1 mL 11   fluticasone (FLOVENT HFA) 110 MCG/ACT inhaler Inhale 1 puff into the lungs 2 (two) times daily.     fluticasone furoate-vilanterol (BREO ELLIPTA)  200-25 MCG/ACT AEPB Inhale 1 puff into the lungs once  a day. 60 each 3   gabapentin (NEURONTIN) 300 MG capsule Take 1 capsule (300 mg total) by mouth 2 (two) times daily. 180 capsule 0   glucose blood (FREESTYLE LITE) test strip Use to test blood sugars daily 100 each 5   hydrOXYzine (ATARAX) 25 MG tablet Take 1 tablet (25 mg total) by mouth at bedtime as needed. 30 tablet 1   ibuprofen (ADVIL) 600 MG tablet Take 600 mg by mouth every 6 (six) hours as needed.     indapamide (LOZOL) 1.25 MG tablet TAKE 1 TABLET BY MOUTH ONCE A DAY. 90 tablet 1   Lancets (FREESTYLE) lancets Use to test blood sugars daily 100 each 5   MELATONIN PO Take 3 mg by mouth as needed.     metoCLOPramide (REGLAN) 10 MG tablet Take 1 tablet by mouth three times a day while having migraine or nausea. 90 tablet 5   norethindrone (MICRONOR) 0.35 MG tablet Take 1 tablet by mouth daily. 84 tablet 4   ondansetron (ZOFRAN-ODT) 4 MG disintegrating tablet Dissolve 1 tablet by mouth every 6 hours as needed for nausea or for headache. 30 tablet 11   rizatriptan (MAXALT-MLT) 10 MG disintegrating tablet DISSOLVE 1 TABLET BY MOUTH AS NEEDED FOR MIGRAINE, MAY REPEAT IN 2 HOURS IF NEEDED 9 tablet 4   Semaglutide,0.25 or 0.5MG/DOS, (OZEMPIC, 0.25 OR 0.5 MG/DOSE,) 2 MG/3ML SOPN Inject 0.68m under the skin once a week. 3 mL 2   Semaglutide,0.25 or 0.5MG/DOS, (OZEMPIC, 0.25 OR 0.5 MG/DOSE,) 2 MG/3ML SOPN Inject 0.5 mg into the skin once a week. 3 mL 2   tiZANidine (ZANAFLEX) 2 MG tablet Take 1 tablet (2 mg total) by mouth every 6 (six) hours as needed for muscle spasms (migraine relief). 90 tablet 3   Current Facility-Administered Medications  Medication Dose Route Frequency Provider Last Rate Last Admin   triamcinolone acetonide (KENALOG-40) injection 10 mg  10 mg Intramuscular Once SJaquita Folds MD        Allergies as of 05/12/2022 - Review Complete 12/30/2021  Allergen Reaction Noted   Bactrim [sulfamethoxazole-trimethoprim] Hives  11/06/2015   Sulfa antibiotics  10/31/2019   Vicodin [hydrocodone-acetaminophen] Nausea And Vomiting 04/07/2013    Vitals: There were no vitals filed for this visit.   There is no height or weight on file to calculate BMI.  Physical exam: General: well developed, well nourished, very pleasant middle-age Caucasian female, seated, in no evident distress Head: head normocephalic and atraumatic.   Neck: supple with no carotid or supraclavicular bruits Cardiovascular: regular rate and rhythm, no murmurs Musculoskeletal: no deformity Skin:  no rash/petichiae Vascular:  Normal pulses all extremities   Neurologic Exam Mental Status: Awake and fully alert. Oriented to place and time. Recent and remote memory intact. Attention span, concentration and fund of knowledge appropriate. Mood and affect appropriate.  Cranial Nerves: Pupils equal, briskly reactive to light. Extraocular movements full without nystagmus. Visual fields full to confrontation. Hearing intact. Facial sensation intact. Face, tongue, palate moves normally and symmetrically.  Motor: Normal bulk and tone. Normal strength in all tested extremity muscles. Sensory.: intact to touch , pinprick , position and vibratory sensation.  Coordination: Rapid alternating movements normal in all extremities. Finger-to-nose and heel-to-shin performed accurately bilaterally. Gait and Station: Arises from chair without difficulty. Stance is normal. Gait demonstrates normal stride length and balance Reflexes: 1+ and symmetric. Toes downgoing.      Assessment/Plan:  36year old with chronic migraines with severe migraine 05/2019 accompanied by slurred speech, right  arm heaviness and unsteadiness with symptoms shortly resolving.  Migraines greatly controlled on Aimovig but will occur 3-4 times monthly typically upon awakening   1.  Chronic migraine without aura Preventative: Continue Aimovig 140 mg SQ monthly - refill and copay card  provided  Emergent: Rizatriptan as needed and as needed Zofran and baclofen for moderate headaches and Reglan and tizanidine for severe headaches  Tried/failed: Topiramate, Imatrex, Nurtec and Ubrelvy   2.  Questionable sleep apnea Refer to [facility] for now migraines upon awakening and long standing hx of snoring, insomnia and daytime fatigue.  She was agreeable to proceed with sleep evaluation.    Follow-up in 1 year or earlier if needed    CC:  Tisovec, Fransico Him, MD     I spent 24 minutes of face-to-face and non-face-to-face time with patient.  This included previsit chart review, lab review, study review, order entry, electronic health record documentation, patient education and discussion regarding migraine headaches, current treatment plan, possible underlying sleep apnea and answered all other questions to patient satisfaction    Frann Rider, AGNP-BC  [facility] [address] [place], [place] [address]  Phone [phone] Fax [phone] Note: This document was prepared with digital dictation and possible smart phrase technology. Any transcriptional errors that result from this process are unintentional.  agree with assessment and plan as stated.     Sarina Ill, MD [facility]

## 2022-05-12 ENCOUNTER — Telehealth: Payer: Self-pay | Admitting: Adult Health

## 2022-05-12 ENCOUNTER — Ambulatory Visit: Payer: 59 | Admitting: Adult Health

## 2022-05-12 NOTE — Telephone Encounter (Signed)
LVM and sent mychart msg informing pt of today's appt cancellation- Janett Billow out sick.

## 2022-05-17 ENCOUNTER — Other Ambulatory Visit (HOSPITAL_COMMUNITY): Payer: Self-pay

## 2022-05-26 ENCOUNTER — Other Ambulatory Visit (HOSPITAL_COMMUNITY): Payer: Self-pay

## 2022-05-26 ENCOUNTER — Other Ambulatory Visit (HOSPITAL_BASED_OUTPATIENT_CLINIC_OR_DEPARTMENT_OTHER): Payer: Self-pay

## 2022-05-26 MED ORDER — ALBUTEROL SULFATE HFA 108 (90 BASE) MCG/ACT IN AERS
2.0000 | INHALATION_SPRAY | RESPIRATORY_TRACT | 2 refills | Status: DC | PRN
  Filled 2022-05-26: qty 6.7, 30d supply, fill #0

## 2022-05-26 MED ORDER — PROAIR RESPICLICK 108 (90 BASE) MCG/ACT IN AEPB
2.0000 | INHALATION_SPRAY | Freq: Four times a day (QID) | RESPIRATORY_TRACT | 2 refills | Status: DC | PRN
  Filled 2022-05-26: qty 1, 25d supply, fill #0

## 2022-05-27 ENCOUNTER — Other Ambulatory Visit (HOSPITAL_COMMUNITY): Payer: Self-pay

## 2022-05-28 DIAGNOSIS — G4733 Obstructive sleep apnea (adult) (pediatric): Secondary | ICD-10-CM | POA: Diagnosis not present

## 2022-06-02 ENCOUNTER — Other Ambulatory Visit: Payer: Self-pay

## 2022-06-10 ENCOUNTER — Other Ambulatory Visit (HOSPITAL_COMMUNITY): Payer: Self-pay

## 2022-06-28 DIAGNOSIS — G4733 Obstructive sleep apnea (adult) (pediatric): Secondary | ICD-10-CM | POA: Diagnosis not present

## 2022-07-01 ENCOUNTER — Other Ambulatory Visit (HOSPITAL_COMMUNITY): Payer: Self-pay

## 2022-07-03 ENCOUNTER — Other Ambulatory Visit (HOSPITAL_COMMUNITY): Payer: Self-pay

## 2022-07-03 ENCOUNTER — Other Ambulatory Visit (HOSPITAL_COMMUNITY)
Admission: RE | Admit: 2022-07-03 | Discharge: 2022-07-03 | Disposition: A | Discharge status: Home / Self Care | Payer: Commercial Managed Care - PPO | Source: Ambulatory Visit | Attending: Obstetrics & Gynecology | Admitting: Obstetrics & Gynecology

## 2022-07-03 ENCOUNTER — Encounter (HOSPITAL_BASED_OUTPATIENT_CLINIC_OR_DEPARTMENT_OTHER): Payer: Self-pay | Admitting: Obstetrics & Gynecology

## 2022-07-03 ENCOUNTER — Ambulatory Visit (INDEPENDENT_AMBULATORY_CARE_PROVIDER_SITE_OTHER): Payer: Commercial Managed Care - PPO | Admitting: Obstetrics & Gynecology

## 2022-07-03 VITALS — BP 118/66 | HR 81 | Ht 62.5 in | Wt 213.0 lb

## 2022-07-03 DIAGNOSIS — Z658 Other specified problems related to psychosocial circumstances: Secondary | ICD-10-CM

## 2022-07-03 DIAGNOSIS — N920 Excessive and frequent menstruation with regular cycle: Secondary | ICD-10-CM | POA: Diagnosis not present

## 2022-07-03 DIAGNOSIS — Z01419 Encounter for gynecological examination (general) (routine) without abnormal findings: Secondary | ICD-10-CM

## 2022-07-03 DIAGNOSIS — Z124 Encounter for screening for malignant neoplasm of cervix: Secondary | ICD-10-CM

## 2022-07-03 DIAGNOSIS — F419 Anxiety disorder, unspecified: Secondary | ICD-10-CM | POA: Diagnosis not present

## 2022-07-03 DIAGNOSIS — Z8742 Personal history of other diseases of the female genital tract: Secondary | ICD-10-CM | POA: Diagnosis not present

## 2022-07-03 MED ORDER — NORETHINDRONE 0.35 MG PO TABS
1.0000 | ORAL_TABLET | Freq: Every day | ORAL | 4 refills | Status: DC
  Filled 2022-07-03 – 2022-10-02 (×2): qty 84, 84d supply, fill #0
  Filled 2022-12-25: qty 84, 84d supply, fill #1
  Filled 2023-04-22: qty 84, 84d supply, fill #2

## 2022-07-03 MED ORDER — CITALOPRAM HYDROBROMIDE 40 MG PO TABS
40.0000 mg | ORAL_TABLET | Freq: Every day | ORAL | 3 refills | Status: DC
  Filled 2022-07-03: qty 90, 90d supply, fill #0
  Filled 2022-10-02: qty 90, 90d supply, fill #1
  Filled 2023-01-22: qty 90, 90d supply, fill #2
  Filled 2023-04-22: qty 90, 90d supply, fill #3

## 2022-07-03 NOTE — Progress Notes (Signed)
37 y.o. [id number] Married White or Caucasian female here for annual exam.  Since last visit, she's seen Dr. Wannetta Sender.  Had tailbone injury and she had steroid/lidocaine injection.  This hurt but this really helped.  Intercourse is much better as well.  Did pelvic PT as well.  Cycles are regular.  On pill and this has helped.  Flow is still heavy.  Pleased with regular cycle but flow is still heavy.  She passes clots and does have a lot of cramping.    On Celexa.  Feels this is helping.  Does feel some lack of motivation.  Does want to continue.            Sexually active: Yes.    The current method of family planning is oral progesterone-only contraceptive.    Smoker:  no  Health Maintenance: Pap:  10/31/2019 Negative History of abnormal Pap:  no MMG:  not indicated Colonoscopy:  guidelines reviewed  Screening Labs: Dr. Osborne Casco   reports that she has never smoked. She has never used smokeless tobacco. She reports current alcohol use. She reports that she does not use drugs.  Past Medical History:  Diagnosis Date   Anxiety    Asthma    Bone spur    cervical L side; L side arm numbness intermittent    Depression    Diabetes in pregnancy    Diabetes type 2, controlled ([facility])    Elevated cholesterol with high triglycerides    Gestational diabetes    Hx of varicella    IBS (irritable bowel syndrome)    Kidney stones    followed by Dr. Tammi Klippel   Migraines    without aura   Ovarian cyst    Postpartum care following vaginal delivery (1/30) 07/15/2013   Preterm uterine contractions 06/10/2013    Past Surgical History:  Procedure Laterality Date   MOUTH SURGERY     wisdom teeth extractions    Current Outpatient Medications  Medication Sig Dispense Refill   albuterol (PROVENTIL HFA;VENTOLIN HFA) 108 (90 BASE) MCG/ACT inhaler Inhale 2 puffs into the lungs every 6 (six) hours as needed for wheezing or shortness of breath.     albuterol (VENTOLIN HFA) 108 (90 Base) MCG/ACT inhaler  Inhale 2 puffs into the lungs every 4 (four) hours as needed. 6.7 g 2   Albuterol Sulfate (PROAIR RESPICLICK) 428 (90 Base) MCG/ACT AEPB Take 2 puffs by mouth 4 (four) times daily as needed for shortness of breath. 1 each 2   ALPRAZolam (XANAX) 0.5 MG tablet Take 1 tablet by mouth twice a day as needed for anxiety 30 tablet 3   baclofen (LIORESAL) 10 MG tablet Take 1 tablet (10 mg total) by mouth daily as needed for muscle spasms (acute migraine onset). 30 tablet 5   Blood Glucose Monitoring Suppl (FREESTYLE LITE) w/Device KIT Use to test blood sugars daily 1 kit 0   citalopram (CELEXA) 40 MG tablet Take 1 tablet (40 mg total) by mouth daily. 90 tablet 0   COVID-19 At Home Antigen Test (CARESTART COVID-19 HOME TEST) KIT Use as directed within package instructions. 4 each 0   Erenumab-aooe 140 MG/ML SOAJ INJECT 140 MG (1 pen) INTO THE SKIN EVERY 30 DAYS. 1 mL 11   fluticasone furoate-vilanterol (BREO ELLIPTA) 200-25 MCG/ACT AEPB Inhale 1 puff into the lungs once a day. 60 each 3   gabapentin (NEURONTIN) 300 MG capsule Take 1 capsule (300 mg total) by mouth 2 (two) times daily. 180 capsule 0   glucose  blood (FREESTYLE LITE) test strip Use to test blood sugars daily 100 each 5   hydrOXYzine (ATARAX) 25 MG tablet Take 1 tablet (25 mg total) by mouth at bedtime as needed. 30 tablet 1   ibuprofen (ADVIL) 600 MG tablet Take 600 mg by mouth every 6 (six) hours as needed.     Lancets (FREESTYLE) lancets Use to test blood sugars daily 100 each 5   MELATONIN PO Take 3 mg by mouth as needed.     metoCLOPramide (REGLAN) 10 MG tablet Take 1 tablet by mouth three times a day while having migraine or nausea. 90 tablet 5   norethindrone (MICRONOR) 0.35 MG tablet Take 1 tablet by mouth daily. 84 tablet 4   ondansetron (ZOFRAN-ODT) 4 MG disintegrating tablet Dissolve 1 tablet by mouth every 6 hours as needed for nausea or for headache. 30 tablet 11   rizatriptan (MAXALT-MLT) 10 MG disintegrating tablet DISSOLVE 1  TABLET BY MOUTH AS NEEDED FOR MIGRAINE, MAY REPEAT IN 2 HOURS IF NEEDED 9 tablet 4   Semaglutide,0.25 or 0.'5MG'$ /DOS, (OZEMPIC, 0.25 OR 0.5 MG/DOSE,) 2 MG/3ML SOPN Inject 0.'5mg'$  under the skin once a week. 3 mL 2   Semaglutide,0.25 or 0.'5MG'$ /DOS, (OZEMPIC, 0.25 OR 0.5 MG/DOSE,) 2 MG/3ML SOPN Inject 0.5 mg into the skin once a week. 3 mL 2   tiZANidine (ZANAFLEX) 2 MG tablet Take 1 tablet (2 mg total) by mouth every 6 (six) hours as needed for muscle spasms (migraine relief). 90 tablet 3   indapamide (LOZOL) 1.25 MG tablet TAKE 1 TABLET BY MOUTH ONCE A DAY. 90 tablet 1   Current Facility-Administered Medications  Medication Dose Route Frequency Provider Last Rate Last Admin   triamcinolone acetonide (KENALOG-40) injection 10 mg  10 mg Intramuscular Once Jaquita Folds, MD        Family History  Problem Relation Age of Onset   Hypertension Mother    Hypothyroidism Mother    Heart attack Mother    Diabetes Mother    Heart disease Mother        heart failure   Dementia Mother    Kidney disease Mother    Sleep apnea Mother    Hypertension Father    Hypothyroidism Father    Diabetes Father    Obesity Father    Sleep apnea Father    Cancer Maternal Grandmother        colon   Dementia Maternal Grandmother    Hypertension Maternal Grandmother    Colon cancer Maternal Grandmother    Early death Maternal Grandfather 14       ?pulmonary disease   Pulmonary fibrosis Maternal Grandfather    Seizures Child        thinks its genetic, only seizes if she hits her head,thinks its migraine related    Migraines Neg Hx     ROS: Constitutional: negative Genitourinary:negative  Exam:   BP 118/66 (BP Location: Left Arm, Patient Position: Sitting, Cuff Size: Large)   Pulse 81   Ht 5' 2.5" (1.588 m) Comment: Repoorted  Wt 213 lb (96.6 kg)   LMP 06/20/2022   BMI 38.34 kg/m   Height: 5' 2.5" (158.8 cm) (Repoorted)  General appearance: alert, cooperative and appears stated age Head:  Normocephalic, without obvious abnormality, atraumatic Neck: no adenopathy, supple, symmetrical, trachea midline and thyroid normal to inspection and palpation Lungs: clear to auscultation bilaterally Breasts: normal appearance, no masses or tenderness Heart: regular rate and rhythm Abdomen: soft, non-tender; bowel sounds normal; no masses,  no organomegaly  Extremities: extremities normal, atraumatic, no cyanosis or edema Skin: Skin color, texture, turgor normal. No rashes or lesions Lymph nodes: Cervical, supraclavicular, and axillary nodes normal. No abnormal inguinal nodes palpated Neurologic: Grossly normal   Pelvic: External genitalia:  no lesions              Urethra:  normal appearing urethra with no masses, tenderness or lesions              Bartholins and Skenes: normal                 Vagina: normal appearing vagina with normal color and no discharge, no lesions              Cervix: no lesions              Pap taken: Yes.   Bimanual Exam:  Uterus:  normal size, contour, position, consistency, mobility, non-tender              Adnexa: normal adnexa and no mass, fullness, tenderness               Rectovaginal: Confirms               Anus:  normal sphincter tone, no lesions  Chaperone, Octaviano Batty, CMA, was present for exam.  Assessment/Plan: 1. Well woman exam with routine gynecological exam - Pap smear and HR HPV Obtained today - Mammogram guidelines reviewed - Colonoscopy guidelines reviewed - lab work done with PCP, Dr. Osborne Casco.  Has follow up in two weeks. - vaccines reviewed/updated  2. Menorrhagia with regular cycle - US PELVIC COMPLETE WITH TRANSVAGINAL; Future - continue micronor.  Rf for the next year completed.  3. Cervical cancer screening  4. Anxiety - Celexa '40mg'$  refill completed today.  5. Psychosocial stressors - Ambulatory referral to [facility]

## 2022-07-09 ENCOUNTER — Telehealth: Payer: Self-pay | Admitting: Clinical

## 2022-07-09 DIAGNOSIS — G4733 Obstructive sleep apnea (adult) (pediatric): Secondary | ICD-10-CM | POA: Diagnosis not present

## 2022-07-09 LAB — CYTOLOGY - PAP
Comment: NEGATIVE
Diagnosis: NEGATIVE
High risk HPV: NEGATIVE

## 2022-07-09 NOTE — Telephone Encounter (Signed)
Attempt call regarding referral; Left HIPPA-compliant message to call back Roselyn Reef from [employer] for [employer] at [facility] at  [phone] [facility] office).

## 2022-07-11 ENCOUNTER — Telehealth: Payer: Self-pay | Admitting: Clinical

## 2022-07-11 NOTE — Telephone Encounter (Signed)
Attempt call regarding referral; Left HIPPA-compliant message to call back Roselyn Reef from [employer] for [employer] at [facility] at  [phone] [facility] office).

## 2022-07-14 ENCOUNTER — Other Ambulatory Visit: Payer: Self-pay

## 2022-07-14 ENCOUNTER — Other Ambulatory Visit (HOSPITAL_COMMUNITY): Payer: Self-pay

## 2022-07-15 ENCOUNTER — Other Ambulatory Visit (HOSPITAL_COMMUNITY): Payer: Self-pay

## 2022-07-16 ENCOUNTER — Other Ambulatory Visit: Payer: Self-pay

## 2022-07-16 ENCOUNTER — Other Ambulatory Visit (HOSPITAL_COMMUNITY): Payer: Self-pay

## 2022-07-16 NOTE — BH Specialist Note (Unsigned)
Integrated Behavioral Health via Telemedicine Visit  07/30/2022 [name] [id number]  Number of Integrated Behavioral Health Clinician visits: 1- Initial Visit  Session Start time: 0822   Session End time: [id number]  Total time in minutes: 36   Referring Provider: Hale Bogus, MD Patient/Family location: Home [facility] Provider location: Center for [place] at [facility]  All persons participating in visit: [name] and [place]   Types of Service: Individual psychotherapy and Telephone visit  I connected with Abigail Frank and/or [name]  via  Telephone or Video Enabled Telemedicine Application  (Video is Caregility application) and verified that I am speaking with the correct person using two identifiers. Discussed confidentiality: Yes   I discussed the limitations of telemedicine and the availability of in person appointments.  Discussed there is a possibility of technology failure and discussed alternative modes of communication if that failure occurs.  I discussed that engaging in this telemedicine visit, they consent to the provision of behavioral healthcare and the services will be billed under their insurance.  Patient and/or legal guardian expressed understanding and consented to Telemedicine visit: Yes   Presenting Concerns: Patient and/or family reports the following symptoms/concerns: "Chronic stress": caring for elderly father, children, special needs child (previously caring for grandmother who passed and husband who had colon cancer/no cancer now) leading to poor sleep quality, overeating, lack of motivation, anxiety, panic, worry, isolation, depressed . Pt taking Celexa and Xanax as prescribed; open to implementing additional self-coping strategies.  Duration of problem: Ongoing; Severity of problem:  moderately severe  Patient and/or Family's Strengths/Protective Factors: Concrete  supports in place (healthy food, safe environments, etc.) and Sense of purpose  Goals Addressed: Patient will:  Reduce symptoms of: anxiety, depression, insomnia, and stress   Increase knowledge and/or ability of: self-management skills   Demonstrate ability to: Increase healthy adjustment to current life circumstances and Increase adequate support systems for patient/family  Progress towards Goals: Ongoing  Interventions: Interventions utilized:  Mindfulness or Psychologist, educational, Veterinary surgeon, and Link to [employer] Standardized Assessments completed: GAD-7 and PHQ 9  Patient and/or Family Response: Patient agrees with treatment plan.   Assessment: Patient currently experiencing Adjustment disorder with mixed anxious and depressed mood and Psychosocial stress.   Patient may benefit from psychoeducation and brief therapeutic interventions regarding coping with symptoms of anxiety, depression, life stress .  Plan: Follow up with behavioral health clinician on : Two weeks Behavioral recommendations:  -Continue taking Celexa and Xanax as prescribed -CALM relaxation breathing exercise twice daily (morning; at bedtime); as needed throughout the day. -Begin Worry Time strategy, as discussed. Start by setting up start and end time reminders on phone today; continue daily for two weeks. -Consider caregiver support resource on After Visit Summary; use as needed Referral(s): Integrated Orthoptist (In Clinic) and [employer]:  Caregiver support  I discussed the assessment and treatment plan with the patient and/or parent/guardian. They were provided an opportunity to ask questions and all were answered. They agreed with the plan and demonstrated an understanding of the instructions.   They were advised to call back or seek an in-person evaluation if the symptoms worsen or if the condition fails to improve as anticipated.  Garlan Fair,  LCSW     07/30/2022    8:37 AM 07/03/2022    8:54 AM 06/28/2021   10:33 AM  Depression screen PHQ 2/9  Decreased Interest 2 0 0  Down,  Depressed, Hopeless 2 0 1  PHQ - 2 Score 4 0 1  Altered sleeping 3    Tired, decreased energy 3    Change in appetite 3    Feeling bad or failure about yourself  0    Trouble concentrating 0    Moving slowly or fidgety/restless 0    Suicidal thoughts 0    PHQ-9 Score 13        07/30/2022    8:41 AM  GAD 7 : Generalized Anxiety Score  Nervous, Anxious, on Edge 2  Control/stop worrying 1  Worry too much - different things 2  Trouble relaxing 2  Restless 0  Easily annoyed or irritable 0  Afraid - awful might happen 1  Total GAD 7 Score 8

## 2022-07-17 ENCOUNTER — Other Ambulatory Visit: Payer: Self-pay

## 2022-07-18 ENCOUNTER — Other Ambulatory Visit: Payer: Self-pay

## 2022-07-29 DIAGNOSIS — L659 Nonscarring hair loss, unspecified: Secondary | ICD-10-CM | POA: Diagnosis not present

## 2022-07-29 DIAGNOSIS — G4733 Obstructive sleep apnea (adult) (pediatric): Secondary | ICD-10-CM | POA: Diagnosis not present

## 2022-07-30 ENCOUNTER — Ambulatory Visit (INDEPENDENT_AMBULATORY_CARE_PROVIDER_SITE_OTHER): Payer: Commercial Managed Care - PPO | Admitting: Clinical

## 2022-07-30 DIAGNOSIS — Z658 Other specified problems related to psychosocial circumstances: Secondary | ICD-10-CM

## 2022-07-30 DIAGNOSIS — F4323 Adjustment disorder with mixed anxiety and depressed mood: Secondary | ICD-10-CM | POA: Diagnosis not present

## 2022-07-30 NOTE — Patient Instructions (Signed)
Center for [facility] at [facility] [place], [place] [address] [phone] (main office) [phone] ([place] office)  [facility] (Caregiver Support) [email or web address]

## 2022-07-31 NOTE — BH Specialist Note (Signed)
Integrated Behavioral Health via Telemedicine Visit  08/14/2022 MARIEANNE RUEHLE [id number]  Number of Integrated Behavioral Health Clinician visits: 2- Second Visit  Session Start time: [phone]   Session End time: 0910  Total time in minutes: 24   Referring Provider: Hale Bogus, MD Patient/Family location: Home [facility] Provider location: Center for [place] at [facility]  All persons participating in visit: Patient Alexzandra Panda and [place]   Types of Service: Individual psychotherapy and Video visit  I connected with [place] I [place] and/or [place]  via  Telephone or Video Enabled Telemedicine Application  (Video is Caregility application) and verified that I am speaking with the correct person using two identifiers. Discussed confidentiality: Yes   I discussed the limitations of telemedicine and the availability of in person appointments.  Discussed there is a possibility of technology failure and discussed alternative modes of communication if that failure occurs.  I discussed that engaging in this telemedicine visit, they consent to the provision of behavioral healthcare and the services will be billed under their insurance.  Patient and/or legal guardian expressed understanding and consented to Telemedicine visit: Yes   Presenting Concerns: Patient and/or family reports the following symptoms/concerns: Becoming more aware of hands/feet/jaw tension; uses relaxation breathing exercise to manage throughout the day. Uses modified worry time strategy to delegate tasks that belong to others and focusing on her own self-care within personal realm of control.  Duration of problem: Increasing over time; Severity of problem: moderate  Patient and/or Family's Strengths/Protective Factors: Social connections, Social and Emotional competence, Concrete supports in place (healthy food, safe environments, etc.), Sense of  purpose, and Physical Health (exercise, healthy diet, medication compliance, etc.)  Goals Addressed: Patient will:  Reduce symptoms of: anxiety, depression, insomnia, and stress   Increase knowledge and/or ability of: self-management skills and stress reduction   Demonstrate ability to: Increase healthy adjustment to current life circumstances  Progress towards Goals: Ongoing  Interventions: Interventions utilized:  Supportive Reflection Standardized Assessments completed: Not Needed  Patient and/or Family Response: Patient agrees with treatment plan; implementing daily   Assessment: Patient currently experiencing Adjustment disorder with mixed anxious and depressed mood and Psychosocial stress.   Patient may benefit from continued therapeutic interventions today.  Plan: Follow up with behavioral health clinician on : Call Marica Trentham at [phone], as needed. Behavioral recommendations:  -Continue taking Celexa and Xanax as prescribed -Continue using self-coping strategies (relaxation breathing, modified worry time, healthy boundaries and increased self-care) -Continue plan to join [employer] tomorrow and begin using walking trail tomorrow -Continue plan to obtain second opinion on daughter's diagnosis and advocating for family's health -Continue to consider caregiver support resource, as needed Referral(s): Integrated Orthoptist (In Clinic) and [employer]:  Caregiver support  I discussed the assessment and treatment plan with the patient and/or parent/guardian. They were provided an opportunity to ask questions and all were answered. They agreed with the plan and demonstrated an understanding of the instructions.   They were advised to call back or seek an in-person evaluation if the symptoms worsen or if the condition fails to improve as anticipated.  Garlan Fair, LCSW     07/30/2022    8:37 AM 07/03/2022    8:54 AM 06/28/2021   10:33 AM   Depression screen PHQ 2/9  Decreased Interest 2 0 0  Down, Depressed, Hopeless 2 0 1  PHQ - 2 Score 4 0 1  Altered sleeping 3  Tired, decreased energy 3    Change in appetite 3    Feeling bad or failure about yourself  0    Trouble concentrating 0    Moving slowly or fidgety/restless 0    Suicidal thoughts 0    PHQ-9 Score 13        07/30/2022    8:41 AM  GAD 7 : Generalized Anxiety Score  Nervous, Anxious, on Edge 2  Control/stop worrying 1  Worry too much - different things 2  Trouble relaxing 2  Restless 0  Easily annoyed or irritable 0  Afraid - awful might happen 1  Total GAD 7 Score 8

## 2022-08-09 DIAGNOSIS — G4733 Obstructive sleep apnea (adult) (pediatric): Secondary | ICD-10-CM | POA: Diagnosis not present

## 2022-08-14 ENCOUNTER — Ambulatory Visit: Payer: Commercial Managed Care - PPO | Admitting: Clinical

## 2022-08-14 DIAGNOSIS — F4323 Adjustment disorder with mixed anxiety and depressed mood: Secondary | ICD-10-CM | POA: Diagnosis not present

## 2022-08-14 DIAGNOSIS — Z658 Other specified problems related to psychosocial circumstances: Secondary | ICD-10-CM

## 2022-08-14 NOTE — Patient Instructions (Signed)
Center for [facility] at [facility] [place], [place] [address] [phone] (main office) [phone] ([place] office)  [facility] (Caregiver Support) [email or web address]

## 2022-08-15 DIAGNOSIS — F419 Anxiety disorder, unspecified: Secondary | ICD-10-CM | POA: Diagnosis not present

## 2022-08-15 DIAGNOSIS — J4520 Mild intermittent asthma, uncomplicated: Secondary | ICD-10-CM | POA: Diagnosis not present

## 2022-08-15 DIAGNOSIS — E781 Pure hyperglyceridemia: Secondary | ICD-10-CM | POA: Diagnosis not present

## 2022-08-15 DIAGNOSIS — J302 Other seasonal allergic rhinitis: Secondary | ICD-10-CM | POA: Diagnosis not present

## 2022-08-15 DIAGNOSIS — M4302 Spondylolysis, cervical region: Secondary | ICD-10-CM | POA: Diagnosis not present

## 2022-08-15 DIAGNOSIS — F5102 Adjustment insomnia: Secondary | ICD-10-CM | POA: Diagnosis not present

## 2022-08-15 DIAGNOSIS — G43909 Migraine, unspecified, not intractable, without status migrainosus: Secondary | ICD-10-CM | POA: Diagnosis not present

## 2022-08-15 DIAGNOSIS — F331 Major depressive disorder, recurrent, moderate: Secondary | ICD-10-CM | POA: Diagnosis not present

## 2022-08-15 DIAGNOSIS — E669 Obesity, unspecified: Secondary | ICD-10-CM | POA: Diagnosis not present

## 2022-08-15 DIAGNOSIS — E119 Type 2 diabetes mellitus without complications: Secondary | ICD-10-CM | POA: Diagnosis not present

## 2022-08-19 ENCOUNTER — Ambulatory Visit (INDEPENDENT_AMBULATORY_CARE_PROVIDER_SITE_OTHER): Payer: Commercial Managed Care - PPO | Admitting: Nurse Practitioner

## 2022-08-19 ENCOUNTER — Encounter: Payer: Self-pay | Admitting: Nurse Practitioner

## 2022-08-19 VITALS — BP 110/75 | HR 91 | Temp 98.4°F | Ht 62.0 in | Wt 211.0 lb

## 2022-08-19 DIAGNOSIS — Z7985 Long-term (current) use of injectable non-insulin antidiabetic drugs: Secondary | ICD-10-CM

## 2022-08-19 DIAGNOSIS — E119 Type 2 diabetes mellitus without complications: Secondary | ICD-10-CM

## 2022-08-19 DIAGNOSIS — Z0289 Encounter for other administrative examinations: Secondary | ICD-10-CM

## 2022-08-19 DIAGNOSIS — E669 Obesity, unspecified: Secondary | ICD-10-CM | POA: Diagnosis not present

## 2022-08-19 DIAGNOSIS — Z6838 Body mass index (BMI) 38.0-38.9, adult: Secondary | ICD-10-CM | POA: Diagnosis not present

## 2022-08-19 NOTE — Patient Instructions (Addendum)
What is a GLP-1 Glucagon like peptide-1 (GLP-1) agonists represent a class of medications used to treat type 2 diabetes mellitus and obesity.  GLP-1 medications mimic the action of a hormone called glucagon like peptide 1.  When blood sugar levels start to rise/increase these drugs stimulate the body to produce more insulin.  When that happens, the extra insulin helps to lower the blood sugar levels in the body.  This in returns helps with decreasing cravings.  These medications also slow the movement of food from the stomach into the small intestine.  This in return helps one to full faster and longer.   Diabetic medications: Approved for treatment of diabetes mellitus but does not have full approval for weight loss use Victoza (liraglutide) Ozempic (semaglutide) Mounjaro Trulicity Rybelsus  Weight loss medications: Approved for long-term weight loss use.        Saxenda (liraglutide) Wegovy (semaglutide) Zepbound Contraindications:  Pancreatitis (active gallstones) Medullary thyroid cancer High triglycerides (>500)-will need labs prior to starting Multiple Endocrine Neoplasia syndrome type 2 (MEN 2) Trying to get pregnant Breastfeeding Use with caution with taking insulin or sulfonylureas (will need to monitor blood sugars for hypoglycemia) Side effects (most common): Most common side effects are nausea, gas, bloating and constipation.  Other possible side effects are headaches, belching, diarrhea, tiredness (fatigue), vomiting, upset stomach, dizziness, heartburn and stomach (abdominal pain).  If you think that you are becoming dehydrated, please inform our office or your primary family provider.  Stop immediately and go to ER if you have any symptoms of a serious allergic reaction including swelling of your face, lips, tongue or throat; problems breathing or swallowing; severe rash or itching; fainting or feeling dizzy; or very rapid heart rate.        Steps to starting your start  Contrave  The office will send a prior authorization request to your insurance company for approval. We will send you a mychart message once we hear back from your insurance with a decision.  This can take up to 7-10 business days.   Is Contrave an option for me? Do not take Contrave if you:   Have uncontrolled hypertension  Have or have had seizures  Use other medicines that contain bupropion such as Wellbutrin, Wellbutrin SR, Wellbutrin XL, and Aplenzin.  Have or have had an eating disorder called anorexia (eating very little) or bulimia (eating too much and vomiting to avoid gaining weight)  Are dependent on opioid pain medicines or use medicines to help stop taking opioids such as methadone or buprenorphine, or are in opiate withdrawal  Drink a lot of alcohol and abruptly stop drinking, or use medicines called sedatives (these make you sleepy), benzodiazepines, or anti-seizure medicines and you stop using them all of a sudden  Are taking medicines called monoamine oxidase inhibitors (MAOIs). Ask your healthcare provider or pharmacist if you are not sure if you take an MAOI, including linezoid. Do not start Contrave until you have stopped taking your MAOI for at least 14 days.  Are allergic to naltrexone HCl or bupropion HCl or any of the ingredients in Contrave. See the end of this Medication Guide for a complete list of ingredients in Contrave.  Are pregnant or planning to become pregnant. Tell your healthcare provider right away if you become pregnant while taking CONTRAVE  What is Contrave and how does it work?  Contrave is a prescription only medicine to help with your weight loss. It works on an area of the brain that controls your  appetite.  It is a combination of two medicines that are extended release: naltrexone HCL and bupropion HCL.  One of the ingredients in Contrave, bupropion, is the same ingredient in some other medicines used to treat depression and to help people  quit smoking. However, Contrave is not approved to treat depression or other mental illnesses, or to help people quit smoking (smoking cessation).   This medicine will be most effective when combined with a reduced calorie diet and physical activity.  How should I take Contrave?  Take exactly as your provider tells you to. It is an increasing dose according to the following chart:  Contrave How should I take Contrave?   It is best to take Contrave with food. However, do not take with high-fat meals.   Swallow the extended-release tablet whole. Do not crush, break, or chew it.   If you miss a dose, skip the missed dose and go back to your regular dosing schedule. Do not take extra medicine to make up for a missed dose.  Do not take more than 2 tablets in the morning and 2 tablets in the evening.  Do not take more than 2 tablets at the same time or more than 4 tablets in 1 day.  Do not stop taking Contrave without talking to your provider.   Stopping Contrave suddenly can cause serious side effects, such as seizures.  Swallow Contrave tablets whole. Do not cut, chew, or crush tablets.  Do not take Contrave when eating a high-fat meal. It may increase your risk of seizures.   What should I avoid while taking Contrave?  You should avoid other medicines that contain bupropion such as Wellbutrin, Wellbutrin SR, Wellbutrin XL and Aplenzin.  You should avoid opioid pain medications or medicines to help stop taking opioids such as methadone or buprenorphine. There may be a risk of opioid overdose.  Do not drink alcohol while taking Contrave. If you drink alcohol, talk to your provider before starting Contrave.   Avoid eating a high-fat meal at the same time you take Contrave. It may increase your risk of seizures.   Women who can become pregnant: Use effective birth control (contraception) consistently while taking Contrave. If you miss a menstrual period, STOP Contrave and call our  office immediately. Monthly pregnancy tests will be performed at your appointment if indicated.  What side effects may I notice when taking Contrave?  Side effects that usually do not require medical attention (report to our office if they continue or are bothersome): o Nausea o Constipation (you may take over the counter laxative if needed) o Headache o Dry mouth (drink at least 64 oz. of fluid daily) o Trouble sleeping (insomnia) o Vomiting o Dizziness o Diarrhea   Side effects that you should report to our office as soon as possible: o Seizures: DO NOT take Contrave again o Depression or severe changes in mood o Thoughts about suicide or dying o Feeling anxious, agitated, irritable, restless, or nervous o Slowed breathing and/or shallow breathing o Severe drowsiness o Confusion o Signs of allergic reaction such as rash, itching, hives, fever, swollen lymph glands, painful sores in your mouth or around your eyes, swelling of your lips or tongue, chest pain, or trouble breathing o Increased blood pressure o Increased heart rate or palpitations o Stomach pain lasting more than a few days o Dark urine o Yellowing of the whites of your eyes o Severe tiredness o Sudden changes in vision o Swelling or redness in or  around the eye  o Low blood sugar in Type 2 Diabetes.   Other important information  While taking CONTRAVE, you or your family members should pay close attention to any changes, especially sudden changes, in mood, behaviors, thoughts, or feelings and maintain communication with your provider. You will be asked to sign an informed consent prior to starting Contrave.  If another healthcare provider prescribes narcotic pain medication for you, please call our office immediately for advice regarding Contrave.  Your prescription will be sent electronically to your pharmacy.   Refills will require an office visit    What is Qsymia and how does it work?  Qsymia is a  prescription only medicine to help with your weight loss. It is a combination of two medicines that are low dose, long-acting: Phentermine & Topiramate. Qsymia contains low dose Phentermine which is a stimulant medicine that could affect your heart rate and blood pressure Qsymia is designed to help you feel satisfied faster that will help you to decrease portion size. Also, it helps curb late night snacking habits. Some food you usually enjoy may start to taste differently which will help you make healthier food choices.  This medicine will be most effective when combined with a reduced calorie diet and physical activity.  How should I take Qsymia? Take daily in the morning with breakfast. Swallow the extended-release capsule whole. Do not crush, break, or chew it.  If you miss a dose, take it as soon as possible. If it is after 12pm, skip the missed dose and go back to your regular dosing schedule. Do not take extra medicine to make up for the missed dose. You have received two separate prescriptions today. You will initially take a lower dose of 3.'75mg'$ /'23mg'$  for 14 days then increase to a higher dose of 7.'5mg'$ /'46mg'$  for maintenance for 30 days. There are 4 total dosing options. Your provider will discuss any need to go up to a higher dose during your office visits.  If you are taking Levothyroxine, take the Levothyroxine 1 hours before breakfast and the take the Qsymia 1 hour after breakfast. Do not stop taking Qsymia without talking to your provider. Stopping Qsymia suddenly can cause serious side effects, such as seizures and headaches.   What should I avoid while taking Qsymia? Limit caffeine to 1 small cup daily. Examples are soda, coffee, tea, herbal tea, energy drinks, and chocolate Avoid decongestant medicines like Sudafed, Mucinex-D, and Zyrtec-D. Qsymia may cause you to feel dizzy, drowsy, or confused, or to have trouble thinking or speaking. Do not drive or do anything else that  could be dangerous until you know how this medicine affects you.  Women who can become pregnant: Use effective birth control (contraception) consistently while taking Qsymia. If you miss a menstrual period, STOP Qsymia and call our office immediately. Pregnancy tests will be performed at your appointment if indicated.  What side effects may I notice when taking Qsymia? Side effects that usually do not require medical attention (report to our office if they continue or are bothersome): Dry mouth (drink at least 64 oz of fluid daily) Constipation (you may take over the counter laxative if needed) Metallic taste in your mouth when drinking carbonated beverages Numbness or tingling in the hands, arms, feet or face that lasts more than a week Headache Sudden changes in vision Mental fuzziness (problems with concentration, attention, memory or speech) Trouble sleeping (insomnia) Side effects that you should report to our office as soon as possible: Increases in  heart rate and/or palpitations (feeling like your heart is racing or pounding in your chest that lasts several minutes) Chest pain Increased blood pressure Dizziness or feeling faint Shortness of breath Irritability Feeling anxious, agitated, restless, or nervous Depression or severe changes in mood Problems urinating Unusual swelling of the legs Vomiting   Other important information You will be asked to sign an informed consent prior to starting Qysmia Qsymia is a federally controlled substance. Keep Qsymia in a safe place to prevent misuse and abuse. Selling or giving away Qsymia may harm others, and is against the law.  Your prescription will be sent to the pharmacy during your visit.  Refills will require an office visit. Your insurance may not cover the cost of this medicine. Our office will complete a pre-authorization if required by your insurance.

## 2022-08-19 NOTE — Progress Notes (Signed)
Office: [phone]  /  Fax: [phone]   Initial Visit  Abigail Frank was seen in clinic today to evaluate for obesity. She is interested in losing weight to improve overall health and reduce the risk of weight related complications. She presents today to review program treatment options, initial physical assessment, and evaluation.     She was referred by: Friend or Family  When asked what else they would like to accomplish? She states: Adopt healthier eating patterns, Improve energy levels and physical activity, Improve appearance, Improve self-confidence, and Lose a target amount of weight : Goal weight 170 pounds  Weight history:  She started gaining weight after having her children.  Her weight increased after COVID.    When asked how has your weight affected you? 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    Pharmacotherapy for DMT2:  She is currently taking Ozempic 0.'5mg'$ .  Denies side effects.   Last A1c was 6.0  She is not checking BS at home.   Episodes of hypoglycemia: yes - with skipping meals Last eye exam:  2023    Lab Results  Component Value Date   HGBA1C 6.2 (H) 10/31/2019   Lab Results  Component Value Date   CREATININE 0.69 10/31/2019     Contributing factors: Family history,  pregnancy, stress (her youngest child has special needs)-she has been cared for multiple family members and for her husband who had colon cancer.   Weight promoting medications identified: Contraceptives or hormonal therapy  Current nutrition plan: Low-carb  Current level of physical activity: None  Plans to join the [facility].    Current or previous pharmacotherapy: None  Response to  medication: Never tried medications   Past medical history includes:   Past Medical History:  Diagnosis Date   Anxiety    Asthma    Bone spur    cervical L side; L side arm numbness intermittent    Depression    Diabetes in pregnancy    Diabetes type 2, controlled (HCC)    Elevated cholesterol with high triglycerides    Gestational diabetes    Hx of varicella    IBS (irritable bowel syndrome)    Kidney stones    followed by Dr. Tammi Klippel   Migraines    without aura   Ovarian cyst    Postpartum care following vaginal delivery (1/30) 07/15/2013   Preterm uterine contractions 06/10/2013     Objective:   BP 110/75   Pulse 91   Temp 98.4 F (36.9 C)   Ht '5\' 2"'$  (1.575 m)   Wt 211 lb (95.7 kg)   LMP 08/01/2022 (Exact Date)   SpO2 95%   BMI 38.59 kg/m  She was weighed on the bioimpedance scale: Body mass index is 38.59 kg/m.  Peak Weight:235 lbs , Body Fat%:46.3, Visceral Fat Rating:12, Weight trend over the last 12 months: fluctuated    General:  Alert, oriented and cooperative. Patient is in no acute distress.  Respiratory: Normal respiratory effort, no problems with respiration noted   Gait: able to ambulate independently  Mental Status: Normal mood and affect. Normal behavior. Normal judgment and thought content.   DIAGNOSTIC DATA REVIEWED:  BMET    Component Value Date/Time   NA 141  10/31/2019 1518   K 4.5 10/31/2019 1518   CL 102 10/31/2019 1518   CO2 25 10/31/2019 1518   GLUCOSE 76 10/31/2019 1518   GLUCOSE 151 (H) 06/16/2017 2322   BUN 11 10/31/2019 1518   CREATININE 0.69 10/31/2019 1518   CALCIUM 9.2 10/31/2019 1518   GFRNONAA 115 10/31/2019 1518   GFRAA 132 10/31/2019 1518   Lab Results  Component Value Date   HGBA1C 6.2 (H) 10/31/2019   No results found for: "INSULIN" CBC    Component Value Date/Time   WBC 8.2 10/31/2019 1518   WBC 8.5 07/14/2017 1209   RBC 4.84 10/31/2019 1518   RBC 5.12 (H) 07/14/2017 1209   HGB 13.0 10/31/2019 1518    HCT 41.4 10/31/2019 1518   PLT 281 10/31/2019 1518   MCV 86 10/31/2019 1518   MCH 26.9 10/31/2019 1518   MCH 27.9 06/16/2017 2322   MCHC 31.4 (L) 10/31/2019 1518   MCHC 33.6 07/14/2017 1209   RDW 13.2 10/31/2019 1518   Iron/TIBC/Ferritin/ %Sat    Component Value Date/Time   IRON 40 10/31/2019 1518   TIBC 333 10/31/2019 1518   FERRITIN 60 10/31/2019 1518   IRONPCTSAT 12 (L) 10/31/2019 1518   Lipid Panel  No results found for: "CHOL", "TRIG", "HDL", "CHOLHDL", "VLDL", "LDLCALC", "LDLDIRECT" Hepatic Function Panel     Component Value Date/Time   PROT 6.8 10/31/2019 1518   ALBUMIN 4.5 10/31/2019 1518   AST 21 10/31/2019 1518   ALT 24 10/31/2019 1518   ALKPHOS 104 10/31/2019 1518   BILITOT <0.2 10/31/2019 1518      Component Value Date/Time   TSH 2.450 10/31/2019 1518     Assessment and Plan:   Controlled type 2 diabetes mellitus without complication, without long-term current use of insulin ([place]) Continue to follow up with PCP.  Continue medications as directed. Consider increasing Ozempic dose.  Patient recently had labs obtained at [facility] office.  Will bring to next visit to review.    Good blood sugar control is important to decrease the likelihood of diabetic complications such as nephropathy, neuropathy, limb loss, blindness, coronary artery disease, and death. Intensive lifestyle modification including diet, exercise and weight loss are the first line of treatment for diabetes.    Generalized obesity  BMI 38.0-38.9,adult        Obesity Treatment / Action Plan:  Patient will work on garnering support from family and friends to begin weight loss journey. Will work on eliminating or reducing the presence of highly palatable, calorie dense foods in the home. Will complete provided nutritional and psychosocial assessment questionnaire before the next appointment. Will be scheduled for indirect calorimetry to determine resting energy expenditure in a fasting state.   This will allow [place] to create a reduced calorie, high-protein meal plan to promote loss of fat mass while preserving muscle mass.  Obesity Education Performed Today:  She was weighed on the bioimpedance scale and results were discussed and documented in the synopsis.  We discussed obesity as a disease and the importance of a more detailed evaluation of all the factors contributing to the disease.  We discussed the importance of long term lifestyle changes which include nutrition, exercise and behavioral modifications as well as the importance of customizing this to her specific health and social needs.  We discussed the benefits of reaching a healthier weight to alleviate the symptoms of existing conditions and reduce the risks of the biomechanical, metabolic and psychological effects of obesity.  Orlinda I Mccary appears to  be in the action stage of change and states they are ready to start intensive lifestyle modifications and behavioral modifications.  30 minutes was spent today on this visit including the above counseling, pre-visit chart review, and post-visit documentation.  Reviewed by clinician on day of visit: allergies, medications, problem list, medical history, surgical history, family history, social history, and previous encounter notes pertinent to obesity diagnosis.    Odile Binda Sheily Lineman FNP-C

## 2022-08-20 ENCOUNTER — Ambulatory Visit (INDEPENDENT_AMBULATORY_CARE_PROVIDER_SITE_OTHER): Payer: Commercial Managed Care - PPO

## 2022-08-20 ENCOUNTER — Ambulatory Visit (HOSPITAL_BASED_OUTPATIENT_CLINIC_OR_DEPARTMENT_OTHER): Payer: Commercial Managed Care - PPO | Admitting: Obstetrics & Gynecology

## 2022-08-20 ENCOUNTER — Encounter (HOSPITAL_BASED_OUTPATIENT_CLINIC_OR_DEPARTMENT_OTHER): Payer: Self-pay | Admitting: Obstetrics & Gynecology

## 2022-08-20 VITALS — BP 115/65 | HR 80 | Ht 62.0 in | Wt 213.4 lb

## 2022-08-20 DIAGNOSIS — M6289 Other specified disorders of muscle: Secondary | ICD-10-CM | POA: Diagnosis not present

## 2022-08-20 DIAGNOSIS — N920 Excessive and frequent menstruation with regular cycle: Secondary | ICD-10-CM | POA: Diagnosis not present

## 2022-08-20 DIAGNOSIS — R102 Pelvic and perineal pain: Secondary | ICD-10-CM

## 2022-08-20 NOTE — Progress Notes (Signed)
GYNECOLOGY  VISIT  CC:   pelvic cramping, follow up after ultrasound  HPI: 37 y.o. G21P2002 Married White or Caucasian female here for discussion of ultrasound results.  Uterus normal measuring 7.7 x 5.5 x 4.5cm.  Endometrium 8.65m.  Ovaries normal.  No free fluid.  Pt reassured by normal ultrasound.  She has been experiencing a lot of stress this past year.  Felt pelvic PT with injections with Dr. SWannetta Senderwere the most significant improvement was noted.  Has been advised to reach out directly to Dr. SWannetta Senderif needs follow up.  Feels that is something she will do.  New referral for PT placed today.  All questions answered.   Past Medical History:  Diagnosis Date   Anxiety    Asthma    Bone spur    cervical L side; L side arm numbness intermittent    Depression    Diabetes in pregnancy    Diabetes type 2, controlled (HCC)    Elevated cholesterol with high triglycerides    Gestational diabetes    Hx of varicella    IBS (irritable bowel syndrome)    Kidney stones    followed by Dr. MTammi Klippel  Migraines    without aura   Ovarian cyst    Postpartum care following vaginal delivery (1/30) 07/15/2013   Preterm uterine contractions 06/10/2013    MEDS:   Current Outpatient Medications on File Prior to Visit  Medication Sig Dispense Refill   albuterol (PROVENTIL HFA;VENTOLIN HFA) 108 (90 BASE) MCG/ACT inhaler Inhale 2 puffs into the lungs every 6 (six) hours as needed for wheezing or shortness of breath.     albuterol (VENTOLIN HFA) 108 (90 Base) MCG/ACT inhaler Inhale 2 puffs into the lungs every 4 (four) hours as needed. 6.7 g 2   Albuterol Sulfate (PROAIR RESPICLICK) [id number](90 Base) MCG/ACT AEPB Take 2 puffs by mouth 4 (four) times daily as needed for shortness of breath. 1 each 2   ALPRAZolam (XANAX) 0.5 MG tablet Take 1 tablet by mouth twice a day as needed for anxiety 30 tablet 3   baclofen (LIORESAL) 10 MG tablet Take 1 tablet (10 mg total) by mouth daily as needed for muscle spasms  (acute migraine onset). 30 tablet 5   Blood Glucose Monitoring Suppl (FREESTYLE LITE) w/Device KIT Use to test blood sugars daily 1 kit 0   citalopram (CELEXA) 40 MG tablet Take 1 tablet (40 mg total) by mouth daily. 90 tablet 3   COVID-19 At Home Antigen Test (CARESTART COVID-19 HOME TEST) KIT Use as directed within package instructions. 4 each 0   Erenumab-aooe 140 MG/ML SOAJ INJECT 140 MG (1 pen) INTO THE SKIN EVERY 30 DAYS. 1 mL 11   fluticasone furoate-vilanterol (BREO ELLIPTA) 200-25 MCG/ACT AEPB Inhale 1 puff into the lungs once a day. 60 each 3   gabapentin (NEURONTIN) 300 MG capsule Take 1 capsule (300 mg total) by mouth 2 (two) times daily. 180 capsule 0   glucose blood (FREESTYLE LITE) test strip Use to test blood sugars daily 100 each 5   hydrOXYzine (ATARAX) 25 MG tablet Take 1 tablet (25 mg total) by mouth at bedtime as needed. 30 tablet 1   ibuprofen (ADVIL) 600 MG tablet Take 600 mg by mouth every 6 (six) hours as needed.     Lancets (FREESTYLE) lancets Use to test blood sugars daily 100 each 5   MELATONIN PO Take 3 mg by mouth as needed.     metoCLOPramide (REGLAN) 10 MG tablet  Take 1 tablet by mouth three times a day while having migraine or nausea. 90 tablet 5   norethindrone (MICRONOR) 0.35 MG tablet Take 1 tablet by mouth daily. 84 tablet 4   ondansetron (ZOFRAN-ODT) 4 MG disintegrating tablet Dissolve 1 tablet by mouth every 6 hours as needed for nausea or for headache. 30 tablet 11   rizatriptan (MAXALT-MLT) 10 MG disintegrating tablet DISSOLVE 1 TABLET BY MOUTH AS NEEDED FOR MIGRAINE, MAY REPEAT IN 2 HOURS IF NEEDED 9 tablet 4   Semaglutide,0.25 or 0.'5MG'$ /DOS, (OZEMPIC, 0.25 OR 0.5 MG/DOSE,) 2 MG/3ML SOPN Inject 0.'5mg'$  under the skin once a week. 3 mL 2   Semaglutide,0.25 or 0.'5MG'$ /DOS, (OZEMPIC, 0.25 OR 0.5 MG/DOSE,) 2 MG/3ML SOPN Inject 0.5 mg into the skin once a week. 3 mL 2   tiZANidine (ZANAFLEX) 2 MG tablet Take 1 tablet (2 mg total) by mouth every 6 (six) hours as  needed for muscle spasms (migraine relief). 90 tablet 3   indapamide (LOZOL) 1.25 MG tablet TAKE 1 TABLET BY MOUTH ONCE A DAY. 90 tablet 1   Current Facility-Administered Medications on File Prior to Visit  Medication Dose Route Frequency Provider Last Rate Last Admin   triamcinolone acetonide (KENALOG-40) injection 10 mg  10 mg Intramuscular Once Jaquita Folds, MD        ALLERGIES: Bactrim [sulfamethoxazole-trimethoprim], Hydrocodone, Sulfa antibiotics, and Vicodin [hydrocodone-acetaminophen]  SH:  married, non smoker  Review of Systems  Constitutional: Negative.   Genitourinary:        Pelvic cramping    PHYSICAL EXAMINATION:    BP 115/65 (BP Location: Right Arm, Patient Position: Sitting, Cuff Size: Large)   Pulse 80   Ht '5\' 2"'$  (1.575 m) Comment: Reported  Wt 213 lb 6.4 oz (96.8 kg)   LMP 08/01/2022 (Exact Date)   BMI 39.03 kg/m     General appearance: alert, cooperative and appears stated age   Assessment/Plan: 1. Pelvic floor dysfunction - pt is going to reach back out to Dr. Wannetta Sender for continuing pelvic floor injections - Ambulatory referral to Physical Therapy

## 2022-08-21 ENCOUNTER — Other Ambulatory Visit (HOSPITAL_COMMUNITY): Payer: Self-pay

## 2022-08-27 ENCOUNTER — Other Ambulatory Visit (HOSPITAL_COMMUNITY): Payer: Self-pay

## 2022-08-27 DIAGNOSIS — G4733 Obstructive sleep apnea (adult) (pediatric): Secondary | ICD-10-CM | POA: Diagnosis not present

## 2022-08-28 ENCOUNTER — Other Ambulatory Visit: Payer: Self-pay

## 2022-08-28 ENCOUNTER — Other Ambulatory Visit (HOSPITAL_COMMUNITY): Payer: Self-pay

## 2022-08-28 MED ORDER — ALPRAZOLAM 0.5 MG PO TABS
0.5000 mg | ORAL_TABLET | Freq: Two times a day (BID) | ORAL | 3 refills | Status: DC | PRN
  Filled 2022-08-28: qty 30, 15d supply, fill #0
  Filled 2022-10-02: qty 30, 15d supply, fill #1
  Filled 2022-11-20: qty 30, 15d supply, fill #2
  Filled 2023-01-22: qty 30, 15d supply, fill #3

## 2022-09-04 ENCOUNTER — Other Ambulatory Visit (HOSPITAL_COMMUNITY): Payer: Self-pay

## 2022-09-04 ENCOUNTER — Telehealth: Payer: Self-pay | Admitting: Adult Health

## 2022-09-04 ENCOUNTER — Encounter (HOSPITAL_COMMUNITY): Payer: Self-pay

## 2022-09-04 ENCOUNTER — Ambulatory Visit: Payer: Commercial Managed Care - PPO | Attending: Obstetrics & Gynecology

## 2022-09-04 ENCOUNTER — Other Ambulatory Visit: Payer: Self-pay

## 2022-09-04 ENCOUNTER — Other Ambulatory Visit: Payer: Self-pay | Admitting: Neurology

## 2022-09-04 DIAGNOSIS — M6281 Muscle weakness (generalized): Secondary | ICD-10-CM

## 2022-09-04 DIAGNOSIS — M5459 Other low back pain: Secondary | ICD-10-CM | POA: Diagnosis not present

## 2022-09-04 DIAGNOSIS — M6289 Other specified disorders of muscle: Secondary | ICD-10-CM | POA: Insufficient documentation

## 2022-09-04 DIAGNOSIS — M25552 Pain in left hip: Secondary | ICD-10-CM | POA: Diagnosis not present

## 2022-09-04 DIAGNOSIS — M62838 Other muscle spasm: Secondary | ICD-10-CM

## 2022-09-04 DIAGNOSIS — R279 Unspecified lack of coordination: Secondary | ICD-10-CM | POA: Diagnosis not present

## 2022-09-04 DIAGNOSIS — R102 Pelvic and perineal pain: Secondary | ICD-10-CM | POA: Diagnosis not present

## 2022-09-04 MED ORDER — OZEMPIC (0.25 OR 0.5 MG/DOSE) 2 MG/3ML ~~LOC~~ SOPN
0.5000 mg | PEN_INJECTOR | SUBCUTANEOUS | 2 refills | Status: DC
  Filled 2022-09-04: qty 3, 28d supply, fill #0
  Filled 2022-10-02: qty 3, 28d supply, fill #1

## 2022-09-04 NOTE — Therapy (Signed)
OUTPATIENT PHYSICAL THERAPY FEMALE PELVIC EVALUATION   Patient Name: Abigail Frank MRN: [id number] DOB:11-23-1985, 37 y.o., female Today's Date: 09/04/2022  END OF SESSION:  PT End of Session - 09/04/22 0931     Visit Number 1    Date for PT Re-Evaluation 02/19/23    Authorization Type [employer]    PT Start Time 0930    PT Stop Time 1010    PT Time Calculation (min) 40 min    Activity Tolerance Patient tolerated treatment well    Behavior During Therapy WFL for tasks assessed/performed             Past Medical History:  Diagnosis Date   Anxiety    Asthma    Bone spur    cervical L side; L side arm numbness intermittent    Depression    Diabetes in pregnancy    Diabetes type 2, controlled (HCC)    Elevated cholesterol with high triglycerides    Gestational diabetes    Hx of varicella    IBS (irritable bowel syndrome)    Kidney stones    followed by Dr. Tammi Klippel   Migraines    without aura   Ovarian cyst    Postpartum care following vaginal delivery (1/30) 07/15/2013   Preterm uterine contractions 06/10/2013   Past Surgical History:  Procedure Laterality Date   MOUTH SURGERY     wisdom teeth extractions   Patient Active Problem List   Diagnosis Date Noted   Obesity 07/01/2021   History of menorrhagia 07/01/2021   Fecal smearing 07/01/2021   Anxiety 07/01/2021   Diabetes type 2, controlled ([place]) 06/28/2021   Carpal tunnel syndrome of left wrist 12/26/2018   Chronic migraine without aura without status migrainosus, not intractable 10/09/2018    PCP: Tisovec, Fransico Him, MD  REFERRING PROVIDER: Megan Salon, MD  REFERRING DIAG: M62.89 (ICD-10-CM) - Pelvic floor dysfunction R10.2 (ICD-10-CM) - Pelvic pain  THERAPY DIAG:  Pelvic pain  Other low back pain  Pain in left hip  Muscle weakness (generalized)  Other muscle spasm  Unspecified lack of coordination  Rationale for Evaluation and Treatment: Rehabilitation  ONSET DATE: 3  months since exacerbation (over 2 years ago since starting)  SUBJECTIVE:                                                                                                                                                                                           09/04/22 SUBJECTIVE STATEMENT: Pt states that she was in PT before due to fecal smearing and she has seen good progress with that. Now, her biggest complaint is lower abdominal  cramping. She states that she has some vaginal tenderness. She feels like she is on her period/crampy all the time. She has Lt hip pain that does not get better. She has been having heavier menstrual cycles with lots of clotting. She is on progesterone only birth control.  Fluid intake: Yes: 64 oz a day, caffeine drinks    PAIN:  Are you having pain? Yes NPRS scale: 3/10 Pain location:  bil abdominal; low back pain, Lt hip pain  Pain type: sharp and cramping Pain description: constant   Aggravating factors: uncertain Relieving factors: heat, ibuprofen   PRECAUTIONS: None  WEIGHT BEARING RESTRICTIONS: No  FALLS:  Has patient fallen in last 6 months? No  LIVING ENVIRONMENT: Lives with: lives with their family Lives in: House/apartment   OCCUPATION: nurse at [employer]  PLOF: Independent  PATIENT GOALS: decrease pain  PERTINENT HISTORY:  IBS, migraines, G2P2 with significant tearing Sexual abuse: No  BOWEL MOVEMENT: Pain with bowel movement: No Type of bowel movement:Frequency at least 1x/day - sometimes multiple with ozempic and Strain No Fully empty rectum: No Leakage: No - very rare Pads: No Fiber supplement: No  URINATION: Pain with urination: No Fully empty bladder: Yes: - Stream:  Has to bear down a little to start stream Urgency: No Frequency: every 2 hours Leakage:  none Pads: No   INTERCOURSE: Pain with intercourse: During Penetration - can feel in Lt butt cheek Ability to have vaginal penetration:  Yes:  but can be painful Climax: rarely and needs significant stimulation with vibrator   PREGNANCY: Vaginal deliveries 2 Tearing Yes: significant tear with 2nd 9 years ago C-section deliveries 0 Currently pregnant No  PROLAPSE: Cystocele - and Rectocele -   OBJECTIVE:  09/04/22:  DIAGNOSTIC FINDINGS:  Abdominal US  COGNITION: Overall cognitive status: Within functional limits for tasks assessed     SENSATION: Light touch: Appears intact Proprioception: Appears intact  FUNCTIONAL TESTS:  -Squat: Lt weight shift, Rt rotation -Single leg stance: Rt 4 seconds, Lt 10 seconds  GAIT: Comments: WNL  POSTURE: rounded shoulders, forward head, decreased thoracic kyphosis, and posterior pelvic tilt, Rt shoulder and iliac crest elevation, mild Lt lower thoracic curvature   LUMBARAROM/PROM:  A/PROM A/PROM  Eval (% available)  Flexion 100, very painful  Extension 50, very painful  Right lateral flexion 50  Left lateral flexion 50, pain on left  Right rotation 50  Left rotation 50   (Blank rows = not tested)  LOWER EXTREMITY MMT:  MMT Right eval Left eval  Hip flexion 4 4  Hip extension 4+ 4+  Hip abduction 4+ 4+  Hip adduction 3+, pain at pubic symphysis 3+, pain at pubic symphysis  Hip internal rotation 4 4  Hip external rotation 4 4   PALPATION:   General  Tenderness throughout bil/central lower abdomen; mild tenderness at upper abdominal rib attachments; pubic symphysis discomfort; tenderness with palpation of low back, sacrum, and coccyx                 External Perineal Exam NA                             Internal Pelvic Floor NA  Patient confirms identification and approves PT to assess internal pelvic floor and treatment Yes - next treatment session   PELVIC MMT:   MMT eval  Vaginal   Internal Anal Sphincter   External Anal Sphincter   Puborectalis  Diastasis Recti 2 finger width separation with distortion upon increased abdominal pressure  (Blank rows  = not tested)        TONE: NA  PROLAPSE: NA  TODAY'S TREATMENT                                                                                                                              DATE:  09/04/22  EVAL  Neuromuscular re-education: Diaphragmatic breathing Child's pose 10 breaths Happy baby 10 breaths Cat cow 10x Therapeutic activities: Squatty potty Relaxed toilet mechanics Splinting with bowel movement    PATIENT EDUCATION:  Education details: see above Person educated: Patient Education method: Consulting civil engineer, Demonstration, Corporate treasurer cues, Verbal cues, and Handouts Education comprehension: verbalized understanding  HOME EXERCISE PROGRAM: [id number]  ASSESSMENT:  CLINICAL IMPRESSION: Patient is a 37 y.o. female who was seen today for physical therapy evaluation and treatment for abdominal, low back, and Lt hip pain. Exam findings notable for abnormal posture, decreased core strength, decreased single leg stance time, abnormal squat form, decreased lumbar A/ROM, hip weakness, and tenderness to palpation of abdomen/low back/Lt hip. Signs and symptoms are most consistent with core weakness/pelvic instability that is causing postural changes and associated muscle spasm surrounding pelvis; symptoms also exacerbated 2 years ago after direct impact to coccyx which may be factoring into dysfunction. We will plan to perform internal pelvic floor assessment next session. Initial treatment consisted of down training and diaphragmatic breathing in addition to lifestyle modifications of squatty potty, relaxed toilet mechanics, and splinting with bowel movements. She will continue to benefit from skilled PT intervention in order to decrease pain, improve core strength/stability, and return to functional activities without difficulty.  OBJECTIVE IMPAIRMENTS: decreased activity tolerance, decreased coordination, decreased endurance, decreased strength, increased fascial restrictions,  increased muscle spasms, impaired tone, postural dysfunction, and pain.   ACTIVITY LIMITATIONS: lifting, bending, sitting, standing, squatting, and sleeping  PARTICIPATION LIMITATIONS: cleaning, interpersonal relationship, community activity, and occupation  PERSONAL FACTORS: 1 comorbidity: medical history  are also affecting patient's functional outcome.   REHAB POTENTIAL: Good  CLINICAL DECISION MAKING: Stable/uncomplicated  EVALUATION COMPLEXITY: Low   GOALS: Goals reviewed with patient? Yes  SHORT TERM GOALS: Target date: 10/09/22  Pt will be independent with HEP.   Baseline: Goal status: INITIAL  2.  Pt will be able to correctly perform diaphragmatic breathing and appropriate pressure management in order to prevent worsening vaginal wall laxity and improve pelvic floor A/ROM.   Baseline:  Goal status: INITIAL  3.  Pt will be independent with diaphragmatic breathing and down training activities in order to improve pelvic floor relaxation.  Baseline:  Goal status: INITIAL  4.  Pt will be independent with use of squatty potty, relaxed toileting mechanics, and improved bowel movement techniques in order to increase ease of bowel movements and complete evacuation.   Baseline:  Goal status: INITIAL   LONG TERM GOALS: Target date: 02/19/23  Pt will be independent with advanced HEP.  Baseline:  Goal status: INITIAL  2.  Pt will be able to perform all work duties without increased pelvic/low back pain.  Baseline:  Goal status: INITIAL  3.  Pt will deny any abdominal cramping greater than 1/10.  Baseline:  Goal status: INITIAL  4.  Pt will be able to have complete bowel movements without straining and use of appropriate mechanics.  Baseline:  Goal status: INITIAL  5.  Pt will report 0/10 pain with vaginal penetration in order to improve intimate relationship with partner.    Baseline:  Goal status: INITIAL  6.  Pt will increase all impaired lumbar A/ROM by  25% without pain.  Baseline:  Goal status: INITIAL  PLAN:  PT FREQUENCY: 1-2x/week  PT DURATION: 6 months  PLANNED INTERVENTIONS: Therapeutic exercises, Therapeutic activity, Neuromuscular re-education, Balance training, Gait training, Patient/Family education, Self Care, Joint mobilization, Dry Needling, Biofeedback, and Manual therapy  PLAN FOR NEXT SESSION: Internal pelvic floor assessment and treatment.    Heather Roberts, PT, DPT03/21/2410:30 AM

## 2022-09-04 NOTE — Patient Instructions (Signed)
Squatty potty: When your knees are level or below the level of your hips, pelvic floor muscles are pressed against rectum, preventing ease of bowel movement. By getting knees above the level of the hips, these pelvic floor muscles relax, allowing easier passage of bowel movement. Ways to get knees above hips: o Squatty Potty (7inch and 9inch versions) o Small stool o Roll of toilet paper under each foot o Hardback book or stack of magazines under each foot  Relaxed Toileting mechanics: Once in this position, make sure to lean forward with forearms on thighs, wide knees, relaxed stomach, and breathe. *I'd also try leaning back  Splinting: try taking a little toilet paper and pressing gently up and back on the perineum   [facility] [address] [place], [place] [address] Phone # [phone] Fax [phone]

## 2022-09-04 NOTE — Telephone Encounter (Signed)
PA [employer] completed CMM/[employer] [id number] Will await determination

## 2022-09-04 NOTE — Telephone Encounter (Signed)
Called pt. Informed her of message nurse Myriam Jacobson sent, Please call pt, and advise the PA is approved. Pt said Okay  thanks for calling and letting me know.

## 2022-09-04 NOTE — Telephone Encounter (Signed)
PA approved immediately   **Prior Authorization Reference: The request has been approved. The authorization is effective from 09/04/2022 to 03/07/2023 Number: [id number]

## 2022-09-04 NOTE — Telephone Encounter (Signed)
Pt called stating that she is needing a new PA for her Aimovig. Please advise.

## 2022-09-05 ENCOUNTER — Other Ambulatory Visit: Payer: Self-pay

## 2022-09-07 DIAGNOSIS — G4733 Obstructive sleep apnea (adult) (pediatric): Secondary | ICD-10-CM | POA: Diagnosis not present

## 2022-09-17 ENCOUNTER — Ambulatory Visit (INDEPENDENT_AMBULATORY_CARE_PROVIDER_SITE_OTHER): Payer: Commercial Managed Care - PPO | Admitting: Bariatrics

## 2022-09-17 ENCOUNTER — Encounter: Payer: Self-pay | Admitting: Bariatrics

## 2022-09-17 VITALS — BP 108/72 | HR 81 | Temp 98.9°F | Ht 62.0 in | Wt 208.0 lb

## 2022-09-17 DIAGNOSIS — R0602 Shortness of breath: Secondary | ICD-10-CM

## 2022-09-17 DIAGNOSIS — R5383 Other fatigue: Secondary | ICD-10-CM | POA: Diagnosis not present

## 2022-09-17 DIAGNOSIS — E119 Type 2 diabetes mellitus without complications: Secondary | ICD-10-CM

## 2022-09-17 DIAGNOSIS — E538 Deficiency of other specified B group vitamins: Secondary | ICD-10-CM | POA: Diagnosis not present

## 2022-09-17 DIAGNOSIS — Z7985 Long-term (current) use of injectable non-insulin antidiabetic drugs: Secondary | ICD-10-CM

## 2022-09-17 DIAGNOSIS — Z0000 Encounter for general adult medical examination without abnormal findings: Secondary | ICD-10-CM | POA: Insufficient documentation

## 2022-09-17 DIAGNOSIS — Z1331 Encounter for screening for depression: Secondary | ICD-10-CM

## 2022-09-17 DIAGNOSIS — E781 Pure hyperglyceridemia: Secondary | ICD-10-CM | POA: Diagnosis not present

## 2022-09-17 DIAGNOSIS — E669 Obesity, unspecified: Secondary | ICD-10-CM | POA: Diagnosis not present

## 2022-09-17 DIAGNOSIS — Z6838 Body mass index (BMI) 38.0-38.9, adult: Secondary | ICD-10-CM

## 2022-09-17 DIAGNOSIS — E559 Vitamin D deficiency, unspecified: Secondary | ICD-10-CM | POA: Diagnosis not present

## 2022-09-17 DIAGNOSIS — L906 Striae atrophicae: Secondary | ICD-10-CM

## 2022-09-18 LAB — TSH+T4F+T3FREE
Free T4: 1.02 ng/dL (ref 0.82–1.77)
T3, Free: 3.3 pg/mL (ref 2.0–4.4)
TSH: 2.28 u[IU]/mL (ref 0.450–4.500)

## 2022-09-18 LAB — LIPID PANEL WITH LDL/HDL RATIO
Cholesterol, Total: 181 mg/dL (ref 100–199)
HDL: 40 mg/dL (ref >39)
LDL Chol Calc (NIH): 104 mg/dL — ABNORMAL HIGH (ref 0–99)
LDL/HDL Ratio: 2.6 ratio (ref 0.0–3.2)
Triglycerides: 213 mg/dL — ABNORMAL HIGH (ref 0–149)
VLDL Cholesterol Cal: 37 mg/dL (ref 5–40)

## 2022-09-18 LAB — CORTISOL: Cortisol: 6.1 ug/dL — ABNORMAL LOW (ref 6.2–19.4)

## 2022-09-18 LAB — VITAMIN B12: Vitamin B-12: 457 pg/mL (ref 232–1245)

## 2022-09-18 LAB — INSULIN, RANDOM: INSULIN: 21.5 u[IU]/mL (ref 2.6–24.9)

## 2022-09-18 NOTE — Progress Notes (Signed)
Chief Complaint:   OBESITY Abigail Frank (MR# [id number]) is a 37 y.o. female who presents for evaluation and treatment of obesity and related comorbidities. Current BMI is Body mass index is 38.04 kg/m. Abigail Frank has been struggling with her weight for many years and has been unsuccessful in either losing weight, maintaining weight loss, or reaching her healthy weight goal.  Abigail Frank was seen by Everardo Pacific FNP on 08/19/2022, for information session.  She is here for her initial visit.  Abigail Frank is currently in the action stage of change and ready to dedicate time achieving and maintaining a healthier weight. Abigail Frank is interested in becoming our patient and working on intensive lifestyle modifications including (but not limited to) diet and exercise for weight loss.  Abigail Frank's habits were reviewed today and are as follows: she struggles with family and or coworkers weight loss sabotage, her desired weight loss is 48 lbs, she has been heavy most of her life, she started gaining weight at age 33, marriage, having children, personal stressors, her heaviest weight ever was 230 pounds, she has significant food cravings issues, she snacks frequently in the evenings, she wakes up frequently in the middle of the night to eat, she skips meals frequently, she is frequently drinking liquids with calories, she frequently makes poor food choices, she has problems with excessive hunger, she frequently eats larger portions than normal, and she struggles with emotional eating.  Depression Screen Abigail Frank's Food and Mood (modified PHQ-9) score was 18.  Subjective:   1. Other fatigue Abigail Frank admits to daytime somnolence and admits to waking up still tired. Patient has a history of symptoms of daytime fatigue, morning fatigue, and morning headache. Abigail Frank generally gets  4-6  hours of sleep per night, and states that she has nightime awakenings. Snoring is present. Apneic episodes  are not present. Epworth Sleepiness Score is 10.   2. SOB (shortness of breath) on exertion Abigail Frank notes increasing shortness of breath with exercising and seems to be worsening over time with weight gain. She notes getting out of breath sooner with activity than she used to. This has not gotten worse recently. Abigail Frank denies shortness of breath at rest or orthopnea.  3. Controlled type 2 diabetes mellitus without complication, without long-term current use of insulin Patient has an elevated glucose.  Patient is taking Ozempic 0.5 mg weekly.  Last hemoglobin A1c was 6.1.  4. Vitamin D deficiency Patient is taking OTC vitamin D 5000 IU.  Last vitamin D level of 29.4.  5. Health care maintenance Obesity.  6. Hypertriglyceridemia Last Triglycerides >300.  7. Vitamin B 12 deficiency Patient is taking a multivitamin.  8. Striae On abdomen, alopecia areata.  Assessment/Plan:   1. Other fatigue Abigail Frank does feel that her weight is causing her energy to be lower than it should be. Fatigue may be related to obesity, depression or many other causes. Labs will be ordered, and in the meanwhile, Abigail Frank will focus on self care including making healthy food choices, increasing physical activity and focusing on stress reduction.  - EKG 12-Lead - TSH+T4F+T3Free  2. SOB (shortness of breath) on exertion Abigail Frank does feel that she gets out of breath more easily that she used to when she exercises. Abigail Frank's shortness of breath appears to be obesity related and exercise induced. She has agreed to work on weight loss and gradually increase exercise to treat her exercise induced shortness of breath. Will continue to monitor closely.  - TSH+T4F+T3Free  3. Controlled type 2  diabetes mellitus without complication, without long-term current use of insulin Continue medications.  Try papaya enzymes.  - Vitamin B12 - Lipid Panel With LDL/HDL Ratio - Insulin, random - TSH+T4F+T3Free -  Cortisol  4. Vitamin D deficiency Continue vitamin D OTC and recheck level in about 3 months.  5. Health care maintenance Check labs, EKG and IC today.  - Vitamin B12 - Lipid Panel With LDL/HDL Ratio - Insulin, random - TSH+T4F+T3Free - Cortisol  6. Hypertriglyceridemia Decrease carbohydrate intake, increase exercise.  Check labs today.  - Lipid Panel With LDL/HDL Ratio  7. Vitamin B 12 deficiency Check labs today.  - Vitamin B12  8. Striae Check labs today.   - Cortisol  9. Depression screening Abigail Frank had a positive depression screening. Depression is commonly associated with obesity and often results in emotional eating behaviors. We will monitor this closely and work on CBT to help improve the non-hunger eating patterns. Referral to Psychology may be required if no improvement is seen as she continues in our clinic.  10. Generalized obesity  11. BMI 38.0-38.9,adult Abigail Frank is currently in the action stage of change and her goal is to continue with weight loss efforts. I recommend Abigail Frank begin the structured treatment plan as follows:  She has agreed to the Category 3 Plan.  1.  Meal planning. 2.  Intentional eating. 3.  Reviewed labs from 08/15/2022, vitamin D, TSH, A1c, CMP.  Exercise goals: All adults should avoid inactivity. Some physical activity is better than none, and adults who participate in any amount of physical activity gain some health benefits.   Behavioral modification strategies: increasing lean protein intake, decreasing simple carbohydrates, increasing vegetables, increasing water intake, decreasing eating out, no skipping meals, meal planning and cooking strategies, keeping healthy foods in the home, and planning for success.  She was informed of the importance of frequent follow-up visits to maximize her success with intensive lifestyle modifications for her multiple health conditions. She was informed we would discuss her lab results at her  next visit unless there is a critical issue that needs to be addressed sooner. Abigail Frank agreed to keep her next visit at the agreed upon time to discuss these results.  Objective:   Blood pressure 108/72, pulse 81, temperature 98.9 F (37.2 C), height 5\' 2"  (1.575 m), weight 208 lb (94.3 kg), last menstrual period 08/01/2022, SpO2 98 %. Body mass index is 38.04 kg/m.  EKG: Normal sinus rhythm, rate 84 bpm.  Indirect Calorimeter completed today shows a VO2 of 300 and a REE of 2074.  Her calculated basal metabolic rate is [id number] thus her basal metabolic rate is better than expected.  General: Cooperative, alert, well developed, in no acute distress. HEENT: Conjunctivae and lids unremarkable. Cardiovascular: Regular rhythm.  Lungs: Normal work of breathing. Neurologic: No focal deficits.   Lab Results  Component Value Date   CREATININE 0.69 10/31/2019   BUN 11 10/31/2019   NA 141 10/31/2019   K 4.5 10/31/2019   CL 102 10/31/2019   CO2 25 10/31/2019   Lab Results  Component Value Date   ALT 24 10/31/2019   AST 21 10/31/2019   ALKPHOS 104 10/31/2019   BILITOT <0.2 10/31/2019   Lab Results  Component Value Date   HGBA1C 6.2 (H) 10/31/2019   Lab Results  Component Value Date   INSULIN 21.5 09/17/2022   Lab Results  Component Value Date   TSH 2.280 09/17/2022   Lab Results  Component Value Date   CHOL 181 09/17/2022  HDL 40 09/17/2022   LDLCALC 104 (H) 09/17/2022   TRIG 213 (H) 09/17/2022   Lab Results  Component Value Date   WBC 8.2 10/31/2019   HGB 13.0 10/31/2019   HCT 41.4 10/31/2019   MCV 86 10/31/2019   PLT 281 10/31/2019   Lab Results  Component Value Date   IRON 40 10/31/2019   TIBC 333 10/31/2019   FERRITIN 60 10/31/2019   Attestation Statements:   Reviewed by clinician on day of visit: allergies, medications, problem list, medical history, surgical history, family history, social history, and previous encounter notes.  Delfina Redwood, am  acting as Location manager for [employer], DO.  I have reviewed the above documentation for accuracy and completeness, and I agree with the above. Jearld Lesch, DO

## 2022-09-22 ENCOUNTER — Encounter: Payer: Self-pay | Admitting: Bariatrics

## 2022-09-27 DIAGNOSIS — G4733 Obstructive sleep apnea (adult) (pediatric): Secondary | ICD-10-CM | POA: Diagnosis not present

## 2022-10-01 ENCOUNTER — Ambulatory Visit: Payer: Commercial Managed Care - PPO | Admitting: Bariatrics

## 2022-10-01 ENCOUNTER — Encounter: Payer: Self-pay | Admitting: Bariatrics

## 2022-10-01 VITALS — BP 103/67 | HR 74 | Temp 97.8°F | Ht 62.0 in | Wt 207.0 lb

## 2022-10-01 DIAGNOSIS — R7989 Other specified abnormal findings of blood chemistry: Secondary | ICD-10-CM | POA: Diagnosis not present

## 2022-10-01 DIAGNOSIS — E669 Obesity, unspecified: Secondary | ICD-10-CM

## 2022-10-01 DIAGNOSIS — E781 Pure hyperglyceridemia: Secondary | ICD-10-CM

## 2022-10-01 DIAGNOSIS — Z7985 Long-term (current) use of injectable non-insulin antidiabetic drugs: Secondary | ICD-10-CM | POA: Diagnosis not present

## 2022-10-01 DIAGNOSIS — Z6838 Body mass index (BMI) 38.0-38.9, adult: Secondary | ICD-10-CM | POA: Diagnosis not present

## 2022-10-01 DIAGNOSIS — E119 Type 2 diabetes mellitus without complications: Secondary | ICD-10-CM

## 2022-10-01 DIAGNOSIS — R632 Polyphagia: Secondary | ICD-10-CM | POA: Diagnosis not present

## 2022-10-02 ENCOUNTER — Other Ambulatory Visit: Payer: Self-pay | Admitting: Adult Health

## 2022-10-02 ENCOUNTER — Other Ambulatory Visit (HOSPITAL_COMMUNITY): Payer: Self-pay

## 2022-10-02 ENCOUNTER — Other Ambulatory Visit: Payer: Self-pay

## 2022-10-02 MED ORDER — GABAPENTIN 300 MG PO CAPS
300.0000 mg | ORAL_CAPSULE | Freq: Two times a day (BID) | ORAL | 0 refills | Status: DC
  Filled 2022-10-02: qty 180, 90d supply, fill #0

## 2022-10-03 ENCOUNTER — Other Ambulatory Visit (HOSPITAL_COMMUNITY): Payer: Self-pay

## 2022-10-07 NOTE — Progress Notes (Signed)
Chief Complaint:   OBESITY Abigail Frank is here to discuss her progress with her obesity treatment plan along with follow-up of her obesity related diagnoses. Abigail Frank is on the Category 3 Plan and states she is following her eating plan approximately 85% of the time. Abigail Frank states she is at the gym for 60 minutes 2-3 times per week.  Today's visit was #: 2 Starting weight: 208 lbs Starting date: 09/17/2022 Today's weight: 207 lbs Today's date: 10/01/2022 Total lbs lost to date: 1 Total lbs lost since last in-office visit: 1  Interim History: Abigail Frank is down 1 lb since her first visit. She was unable to get adequate protein and food. She had stopped her Ozempic.   Subjective:   1. Hypertriglyceridemia Abigail Frank's recent triglycerides were 213.  2. Low serum cortisol level (Taken in the afternoon).   3. Polyphagia Abigail Frank has Ozempic 0.5 mg.   4. Controlled type 2 diabetes mellitus without complication, without long-term current use of insulin  Abigail Frank's last A1c was 6.5 and insulin 21.5.   Assessment/Plan:   1. Hypertriglyceridemia Abigail Frank is to decrease carbohydrates (sweets and starches), and continue her exercise.   2. Low serum cortisol level I discussed with the patient low cortisol linked to fatigue and weight gain, and also linked to leptin issues.   3. Polyphagia Abigail Frank will continue Ozempic, and will adjust the amount of food.   4. Controlled type 2 diabetes mellitus without complication, without long-term current use of insulin  Amariona will continue Ozempic.   5. Generalized obesity  6. BMI 38.0-38.9,adult Abigail Frank is currently in the action stage of change. As such, her goal is to continue with weight loss efforts. She has agreed to the Category 3 Plan.   Meal planning was discussed. Reviewed labs with the patient, lipids, B12, cortisol, insulin, and thyroid panel. Protein choices and Dining Out guides were given.   Exercise goals: As  is.   Behavioral modification strategies: increasing lean protein intake, decreasing simple carbohydrates, increasing vegetables, increasing water intake, decreasing eating out, no skipping meals, meal planning and cooking strategies, keeping healthy foods in the home, and planning for success.  Abigail Frank has agreed to follow-up with our clinic in 2 weeks. She was informed of the importance of frequent follow-up visits to maximize her success with intensive lifestyle modifications for her multiple health conditions.   Objective:   Blood pressure 103/67, pulse 74, temperature 97.8 F (36.6 C), height  (1.575 m), weight 207 lb (93.9 kg), SpO2 97 %. Body mass index is 37.86 kg/m.  General: Cooperative, alert, well developed, in no acute distress. HEENT: Conjunctivae and lids unremarkable. Cardiovascular: Regular rhythm.  Lungs: Normal work of breathing. Neurologic: No focal deficits.   Lab Results  Component Value Date   CREATININE 0.69 10/31/2019   BUN 11 10/31/2019   NA 141 10/31/2019   K 4.5 10/31/2019   CL 102 10/31/2019   CO2 25 10/31/2019   Lab Results  Component Value Date   ALT 24 10/31/2019   AST 21 10/31/2019   ALKPHOS 104 10/31/2019   BILITOT <0.2 10/31/2019   Lab Results  Component Value Date   HGBA1C 6.2 (H) 10/31/2019   Lab Results  Component Value Date   INSULIN 21.5 09/17/2022   Lab Results  Component Value Date   TSH 2.280 09/17/2022   Lab Results  Component Value Date   CHOL 181 09/17/2022   HDL 40 09/17/2022   LDLCALC 104 (H) 09/17/2022   TRIG 213 (H) 09/17/2022  No results found for: "VD25OH" Lab Results  Component Value Date   WBC 8.2 10/31/2019   HGB 13.0 10/31/2019   HCT 41.4 10/31/2019   MCV 86 10/31/2019   PLT 281 10/31/2019   Lab Results  Component Value Date   IRON 40 10/31/2019   TIBC 333 10/31/2019   FERRITIN 60 10/31/2019   Attestation Statements:   Reviewed by clinician on day of visit: allergies, medications,  problem list, medical history, surgical history, family history, social history, and previous encounter notes.   Trude Mcburney, am acting as Energy manager for [employer], DO.  I have reviewed the above documentation for accuracy and completeness, and I agree with the above. Corinna Capra, DO

## 2022-10-09 ENCOUNTER — Ambulatory Visit: Payer: Commercial Managed Care - PPO | Attending: Obstetrics & Gynecology

## 2022-10-09 DIAGNOSIS — R102 Pelvic and perineal pain: Secondary | ICD-10-CM | POA: Diagnosis not present

## 2022-10-09 DIAGNOSIS — M62838 Other muscle spasm: Secondary | ICD-10-CM | POA: Insufficient documentation

## 2022-10-09 DIAGNOSIS — M5459 Other low back pain: Secondary | ICD-10-CM | POA: Insufficient documentation

## 2022-10-09 DIAGNOSIS — M25552 Pain in left hip: Secondary | ICD-10-CM | POA: Insufficient documentation

## 2022-10-09 DIAGNOSIS — M6281 Muscle weakness (generalized): Secondary | ICD-10-CM | POA: Insufficient documentation

## 2022-10-09 NOTE — Patient Instructions (Signed)

## 2022-10-09 NOTE — Therapy (Signed)
OUTPATIENT PHYSICAL THERAPY TREATMENT NOTE   Patient Name: Abigail Frank MRN: [id number] DOB:31-May-1986, 37 y.o., female Today's Date: 10/09/2022  PCP: Gaspar Garbe, MD REFERRING PROVIDER: Jerene Bears, MD   END OF SESSION:   PT End of Session - 10/09/22 0934     Visit Number 2    Date for PT Re-Evaluation 02/19/23    Authorization Type [employer]             Past Medical History:  Diagnosis Date   Anxiety    Asthma    Bone spur    cervical L side; L side arm numbness intermittent    Depression    Diabetes in pregnancy    Diabetes type 2, controlled    Elevated cholesterol with high triglycerides    Gestational diabetes    Hx of varicella    IBS (irritable bowel syndrome)    Kidney stones    followed by Dr. Kathrynn Running   Migraines    without aura   Ovarian cyst    Postpartum care following vaginal delivery (1/30) 07/15/2013   Preterm uterine contractions 06/10/2013   Sleep apnea    Vitamin D deficiency    Past Surgical History:  Procedure Laterality Date   MOUTH SURGERY     wisdom teeth extractions   Patient Active Problem List   Diagnosis Date Noted   Low serum cortisol level 10/01/2022   Polyphagia 10/01/2022   Other fatigue 09/17/2022   SOB (shortness of breath) on exertion 09/17/2022   Vitamin D deficiency 09/17/2022   Health care maintenance 09/17/2022   BMI 38.0-38.9,adult 09/17/2022   Striae 09/17/2022   Vitamin B 12 deficiency 09/17/2022   Hypertriglyceridemia 09/17/2022   Generalized obesity 07/01/2021   History of menorrhagia 07/01/2021   Fecal smearing 07/01/2021   Anxiety 07/01/2021   Diabetes type 2, controlled 06/28/2021   Carpal tunnel syndrome of left wrist 12/26/2018   Chronic migraine without aura without status migrainosus, not intractable 10/09/2018    REFERRING DIAG: M62.89 (ICD-10-CM) - Pelvic floor dysfunction R10.2 (ICD-10-CM) - Pelvic pain  THERAPY DIAG:  Pelvic pain  Other low back pain  Pain  in left hip  Muscle weakness (generalized)  Other muscle spasm  Rationale for Evaluation and Treatment Rehabilitation  PERTINENT HISTORY: IBS, migraines, G2P2 with significant tearing  PRECAUTIONS: NA  SUBJECTIVE:                                                                                                                                                                                      SUBJECTIVE STATEMENT:  Pt states that she continues to feel crampy like she is about to  start menstrual cycle all the time. Constipation is not doing well due to eating a lot more cheese.    PAIN:  Are you having pain? Yes NPRS scale: 4/10 Pain location: bil abdominal; low back pain, Lt hip pain  Pain type: sharp and cramping Pain description: constant   Aggravating factors: uncertain Relieving factors: heat, ibuprofen    09/04/22 SUBJECTIVE STATEMENT: Pt states that she was in PT before due to fecal smearing and she has seen good progress with that. Now, her biggest complaint is lower abdominal cramping. She states that she has some vaginal tenderness. She feels like she is on her period/crampy all the time. She has Lt hip pain that does not get better. She has been having heavier menstrual cycles with lots of clotting. She is on progesterone only birth control.  Fluid intake: Yes: 64 oz a day, caffeine drinks   PAIN:  Are you having pain? Yes NPRS scale: 3/10 Pain location: bil abdominal; low back pain, Lt hip pain  Pain type: sharp and cramping Pain description: constant   Aggravating factors: uncertain Relieving factors: heat, ibuprofen   PRECAUTIONS: None  WEIGHT BEARING RESTRICTIONS: No  FALLS:  Has patient fallen in last 6 months? No  LIVING ENVIRONMENT: Lives with: lives with their family Lives in: House/apartment   OCCUPATION: nurse at [employer]  PLOF: Independent  PATIENT GOALS: decrease pain  PERTINENT HISTORY:  IBS, migraines, G2P2 with  significant tearing Sexual abuse: No  BOWEL MOVEMENT: Pain with bowel movement: No Type of bowel movement:Frequency at least 1x/day - sometimes multiple with ozempic and Strain No Fully empty rectum: No Leakage: No - very rare Pads: No Fiber supplement: No  URINATION: Pain with urination: No Fully empty bladder: Yes: - Stream: Has to bear down a little to start stream Urgency: No Frequency: every 2 hours Leakage: none Pads: No   INTERCOURSE: Pain with intercourse: During Penetration - can feel in Lt butt cheek Ability to have vaginal penetration:  Yes: but can be painful Climax: rarely and needs significant stimulation with vibrator   PREGNANCY: Vaginal deliveries 2 Tearing Yes: significant tear with 2nd 9 years ago C-section deliveries 0 Currently pregnant No  PROLAPSE: Cystocele - and Rectocele -   OBJECTIVE:  09/04/22:  DIAGNOSTIC FINDINGS:  Abdominal US  COGNITION: Overall cognitive status: Within functional limits for tasks assessed     SENSATION: Light touch: Appears intact Proprioception: Appears intact  FUNCTIONAL TESTS:  -Squat: Lt weight shift, Rt rotation -Single leg stance: Rt 4 seconds, Lt 10 seconds  GAIT: Comments: WNL  POSTURE: rounded shoulders, forward head, decreased thoracic kyphosis, and posterior pelvic tilt, Rt shoulder and iliac crest elevation, mild Lt lower thoracic curvature   LUMBARAROM/PROM:  A/PROM A/PROM  Eval (% available)  Flexion 100, very painful  Extension 50, very painful  Right lateral flexion 50  Left lateral flexion 50, pain on left  Right rotation 50  Left rotation 50   (Blank rows = not tested)  LOWER EXTREMITY MMT:  MMT Right eval Left eval  Hip flexion 4 4  Hip extension 4+ 4+  Hip abduction 4+ 4+  Hip adduction 3+, pain at pubic symphysis 3+, pain at pubic symphysis  Hip internal rotation 4 4  Hip external rotation 4 4   PALPATION:   General  Tenderness throughout bil/central lower  abdomen; mild tenderness at upper abdominal rib attachments; pubic symphysis discomfort; tenderness with palpation of low back, sacrum, and coccyx  External Perineal Exam NA                             Internal Pelvic Floor NA  Patient confirms identification and approves PT to assess internal pelvic floor and treatment Yes - next treatment session   PELVIC MMT:   MMT eval  Vaginal   Internal Anal Sphincter   External Anal Sphincter   Puborectalis   Diastasis Recti 2 finger width separation with distortion upon increased abdominal pressure  (Blank rows = not tested)        TONE: NA  PROLAPSE: NA  TODAY'S TREATMENT                                                                                                                              DATE:  10/09/22 Manual: Trigger Point Dry-Needling  Treatment instructions: Expect mild to moderate muscle soreness. S/S of pneumothorax if dry needled over a lung field, and to seek immediate medical attention should they occur. Patient verbalized understanding of these instructions and education.  Patient Consent Given: Yes Education handout provided: Yes Muscles treated: Lt glutes Electrical stimulation performed: No Parameters: N/A Treatment response/outcome: twitch response/release Abdominal myofascial release focusing in lower abdomen Neuromuscular re-education: Transversus abdominus training with multimodal cues for improved motor control and breath coordination Supine Bil UE ball press Sidelying UE ball press Exercises: Kneeling hip flexor stretch Bent knee fall out Piriformis stretch supine   09/04/22  EVAL  Neuromuscular re-education: Diaphragmatic breathing Child's pose 10 breaths Happy baby 10 breaths Cat cow 10x Therapeutic activities: Squatty potty Relaxed toilet mechanics Splinting with bowel movement    PATIENT EDUCATION:  Education details: see above Person educated: Patient Education  method: Explanation, Demonstration, Actor cues, Verbal cues, and Handouts Education comprehension: verbalized understanding  HOME EXERCISE PROGRAM: [id number]  ASSESSMENT:  CLINICAL IMPRESSION: Pt has done well with HEP since her initial visit. Manual techniques performed to abdomen and LT hip to help reduce pain. With DN in Lt glutes, she had reproduction of sharp pain in Lt lower abdomen, indicating possible relationship between areas. Good pain reduction overall at end of session. Mobility exercises progressed for anterior and posterior chain. Core training started and she was able to increase facilitated and coordinate with breath well. Marland Kitchen She will continue to benefit from skilled PT intervention in order to decrease pain, improve core strength/stability, and return to functional activities without difficulty.  OBJECTIVE IMPAIRMENTS: decreased activity tolerance, decreased coordination, decreased endurance, decreased strength, increased fascial restrictions, increased muscle spasms, impaired tone, postural dysfunction, and pain.   ACTIVITY LIMITATIONS: lifting, bending, sitting, standing, squatting, and sleeping  PARTICIPATION LIMITATIONS: cleaning, interpersonal relationship, community activity, and occupation  PERSONAL FACTORS: 1 comorbidity: medical history are also affecting patient's functional outcome.   REHAB POTENTIAL: Good  CLINICAL DECISION MAKING: Stable/uncomplicated  EVALUATION COMPLEXITY: Low   GOALS: Goals reviewed with patient? Yes  SHORT TERM GOALS: Target  date: 10/09/22- updated 10/09/22  Pt will be independent with HEP.   Baseline: Goal status: IN PROGRESS  2.  Pt will be able to correctly perform diaphragmatic breathing and appropriate pressure management in order to prevent worsening vaginal wall laxity and improve pelvic floor A/ROM.   Baseline:  Goal status: IN PROGRESS  3.  Pt will be independent with diaphragmatic breathing and down training  activities in order to improve pelvic floor relaxation.  Baseline:  Goal status: IN PROGRESS  4.  Pt will be independent with use of squatty potty, relaxed toileting mechanics, and improved bowel movement techniques in order to increase ease of bowel movements and complete evacuation.   Baseline:  Goal status: IN PROGRESS   LONG TERM GOALS: Target date: 02/19/23 - updated 10/09/22  Pt will be independent with advanced HEP.   Baseline:  Goal status: IN PROGRESS  2.  Pt will be able to perform all work duties without increased pelvic/low back pain.  Baseline:  Goal status: IN PROGRESS  3.  Pt will deny any abdominal cramping greater than 1/10.  Baseline:  Goal status: IN PROGRESS  4.  Pt will be able to have complete bowel movements without straining and use of appropriate mechanics.  Baseline:  Goal status: IN PROGRESS  5.  Pt will report 0/10 pain with vaginal penetration in order to improve intimate relationship with partner.    Baseline:  Goal status: IN PROGRESS  6.  Pt will increase all impaired lumbar A/ROM by 25% without pain.  Baseline:  Goal status: IN PROGRESS  PLAN:  PT FREQUENCY: 1-2x/week  PT DURATION: 6 months  PLANNED INTERVENTIONS: Therapeutic exercises, Therapeutic activity, Neuromuscular re-education, Balance training, Gait training, Patient/Family education, Self Care, Joint mobilization, Dry Needling, Biofeedback, and Manual therapy  PLAN FOR NEXT SESSION: Since internal assessment not performed today, will plan on this next visit. Progress core training.  Julio Alm, PT, DPT04/25/249:34 AM

## 2022-10-13 ENCOUNTER — Other Ambulatory Visit (HOSPITAL_COMMUNITY): Payer: Self-pay

## 2022-10-13 ENCOUNTER — Encounter: Payer: Self-pay | Admitting: Bariatrics

## 2022-10-15 ENCOUNTER — Ambulatory Visit: Payer: Commercial Managed Care - PPO | Admitting: Bariatrics

## 2022-10-15 ENCOUNTER — Encounter: Payer: Self-pay | Admitting: Bariatrics

## 2022-10-15 VITALS — BP 104/67 | HR 81 | Temp 98.4°F | Ht 62.0 in | Wt 207.0 lb

## 2022-10-15 DIAGNOSIS — E669 Obesity, unspecified: Secondary | ICD-10-CM

## 2022-10-15 DIAGNOSIS — Z6837 Body mass index (BMI) 37.0-37.9, adult: Secondary | ICD-10-CM | POA: Diagnosis not present

## 2022-10-15 DIAGNOSIS — E119 Type 2 diabetes mellitus without complications: Secondary | ICD-10-CM | POA: Diagnosis not present

## 2022-10-15 DIAGNOSIS — Z6838 Body mass index (BMI) 38.0-38.9, adult: Secondary | ICD-10-CM

## 2022-10-15 DIAGNOSIS — E781 Pure hyperglyceridemia: Secondary | ICD-10-CM

## 2022-10-15 DIAGNOSIS — Z7985 Long-term (current) use of injectable non-insulin antidiabetic drugs: Secondary | ICD-10-CM | POA: Diagnosis not present

## 2022-10-20 ENCOUNTER — Ambulatory Visit: Payer: Commercial Managed Care - PPO

## 2022-10-21 NOTE — Progress Notes (Unsigned)
Chief Complaint:   OBESITY Abigail Frank is here to discuss her progress with her obesity treatment plan along with follow-up of her obesity related diagnoses. Abigail Frank is on the Category 3 Plan and states she is following her eating plan approximately 90% of the time. Abigail Frank states she is at the gym for 60 minutes 3 times per week.  Today's visit was #: 3 Starting weight: 208 lbs Starting date: 09/17/2022 Today's weight: 207 lbs Today's date: 10/15/2022 Total lbs lost to date: 1 Total lbs lost since last in-office visit: 0  Interim History: Abigail Frank weight remains the same as her last visit. She is getting more protein and water.   Subjective:   1. Controlled type 2 diabetes mellitus without complication, without long-term current use of insulin (HCC) Abigail Frank is taking Ozempic (feels sick and full).  2. Hypertriglyceridemia Abigail Frank is not on medications.   Assessment/Plan:   1. Controlled type 2 diabetes mellitus without complication, without long-term current use of insulin (HCC) Abigail Frank will continue her Ozempic at 0.25 mg.   2. Hypertriglyceridemia Abigail Frank will work on increasing her exercise and decreasing carbohydrates.   3. Generalized obesity  4. BMI 38.0-38.9,adult Abigail Frank is currently in the action stage of change. As such, her goal is to continue with weight loss efforts. She has agreed to the Category 2 Plan and keeping a food journal and adhering to recommended goals of 1200-1300 calories and 80 grams of protein.   Meal planning was discussed. She is to keep her water, protein, and fiber high. Change from Category 3 to Category 2.  Exercise goals: As is.   Behavioral modification strategies: increasing lean protein intake, decreasing simple carbohydrates, increasing vegetables, increasing water intake, decreasing eating out, no skipping meals, meal planning and cooking strategies, keeping healthy foods in the home, and planning for  success.  [name] has agreed to follow-up with our clinic in 2 weeks. She was informed of the importance of frequent follow-up visits to maximize her success with intensive lifestyle modifications for her multiple health conditions.   Objective:   Blood pressure 104/67, pulse 81, temperature 98.4 F (36.9 C), height 5\' 2"  (1.575 m), weight 207 lb (93.9 kg), SpO2 99 %. Body mass index is 37.86 kg/m.  General: Cooperative, alert, well developed, in no acute distress. HEENT: Conjunctivae and lids unremarkable. Cardiovascular: Regular rhythm.  Lungs: Normal work of breathing. Neurologic: No focal deficits.   Lab Results  Component Value Date   CREATININE 0.69 10/31/2019   BUN 11 10/31/2019   NA 141 10/31/2019   K 4.5 10/31/2019   CL 102 10/31/2019   CO2 25 10/31/2019   Lab Results  Component Value Date   ALT 24 10/31/2019   AST 21 10/31/2019   ALKPHOS 104 10/31/2019   BILITOT <0.2 10/31/2019   Lab Results  Component Value Date   HGBA1C 6.2 (H) 10/31/2019   Lab Results  Component Value Date   INSULIN 21.5 09/17/2022   Lab Results  Component Value Date   TSH 2.280 09/17/2022   Lab Results  Component Value Date   CHOL 181 09/17/2022   HDL 40 09/17/2022   LDLCALC 104 (H) 09/17/2022   TRIG 213 (H) 09/17/2022   No results found for: "VD25OH" Lab Results  Component Value Date   WBC 8.2 10/31/2019   HGB 13.0 10/31/2019   HCT 41.4 10/31/2019   MCV 86 10/31/2019   PLT 281 10/31/2019   Lab Results  Component Value Date   IRON 40 10/31/2019  TIBC 333 10/31/2019   FERRITIN 60 10/31/2019   Attestation Statements:   Reviewed by clinician on day of visit: allergies, medications, problem list, medical history, surgical history, family history, social history, and previous encounter notes.   Trude Mcburney, am acting as Energy manager for [employer], DO.  I have reviewed the above documentation for accuracy and completeness, and I agree with the above. Corinna Capra, DO

## 2022-10-22 ENCOUNTER — Encounter: Payer: Self-pay | Admitting: Bariatrics

## 2022-10-27 ENCOUNTER — Ambulatory Visit: Payer: 59 | Admitting: Adult Health

## 2022-10-27 ENCOUNTER — Ambulatory Visit: Payer: Commercial Managed Care - PPO | Attending: Obstetrics & Gynecology

## 2022-10-27 DIAGNOSIS — R102 Pelvic and perineal pain: Secondary | ICD-10-CM | POA: Diagnosis not present

## 2022-10-27 DIAGNOSIS — R252 Cramp and spasm: Secondary | ICD-10-CM

## 2022-10-27 DIAGNOSIS — R279 Unspecified lack of coordination: Secondary | ICD-10-CM | POA: Diagnosis not present

## 2022-10-27 DIAGNOSIS — M5459 Other low back pain: Secondary | ICD-10-CM | POA: Diagnosis not present

## 2022-10-27 DIAGNOSIS — G4733 Obstructive sleep apnea (adult) (pediatric): Secondary | ICD-10-CM | POA: Diagnosis not present

## 2022-10-27 DIAGNOSIS — M62838 Other muscle spasm: Secondary | ICD-10-CM | POA: Insufficient documentation

## 2022-10-27 DIAGNOSIS — M6281 Muscle weakness (generalized): Secondary | ICD-10-CM | POA: Insufficient documentation

## 2022-10-27 DIAGNOSIS — M25552 Pain in left hip: Secondary | ICD-10-CM | POA: Diagnosis not present

## 2022-10-27 NOTE — Therapy (Signed)
OUTPATIENT PHYSICAL THERAPY TREATMENT NOTE   Patient Name: Abigail Frank MRN: [id number] DOB:[birth year], 37 y.o., female Today's Date: 10/27/2022  PCP: Gaspar Garbe, MD REFERRING PROVIDER: Jerene Bears, MD   END OF SESSION:   PT End of Session - 10/27/22 1231     Visit Number 3    Date for PT Re-Evaluation 02/19/23    Authorization Type [employer]    PT Start Time 1230    PT Stop Time 1309    PT Time Calculation (min) 39 min    Activity Tolerance Patient tolerated treatment well    Behavior During Therapy WFL for tasks assessed/performed              Past Medical History:  Diagnosis Date   Anxiety    Asthma    Bone spur    cervical L side; L side arm numbness intermittent    Depression    Diabetes in pregnancy    Diabetes type 2, controlled (HCC)    Elevated cholesterol with high triglycerides    Gestational diabetes    Hx of varicella    IBS (irritable bowel syndrome)    Kidney stones    followed by Dr. Kathrynn Running   Migraines    without aura   Ovarian cyst    Postpartum care following vaginal delivery (1/30) 07/15/2013   Preterm uterine contractions 06/10/2013   Sleep apnea    Vitamin D deficiency    Past Surgical History:  Procedure Laterality Date   MOUTH SURGERY     wisdom teeth extractions   Patient Active Problem List   Diagnosis Date Noted   BMI 37.0-37.9, adult 10/15/2022   Low serum cortisol level 10/01/2022   Polyphagia 10/01/2022   Other fatigue 09/17/2022   SOB (shortness of breath) on exertion 09/17/2022   Vitamin D deficiency 09/17/2022   Health care maintenance 09/17/2022   BMI 38.0-38.9,adult 09/17/2022   Striae 09/17/2022   Vitamin B 12 deficiency 09/17/2022   Hypertriglyceridemia 09/17/2022   Generalized obesity 07/01/2021   History of menorrhagia 07/01/2021   Fecal smearing 07/01/2021   Anxiety 07/01/2021   Diabetes type 2, controlled (HCC) 06/28/2021   Carpal tunnel syndrome of left wrist 12/26/2018    Chronic migraine without aura without status migrainosus, not intractable 10/09/2018    REFERRING DIAG: M62.89 (ICD-10-CM) - Pelvic floor dysfunction R10.2 (ICD-10-CM) - Pelvic pain  THERAPY DIAG:  Pelvic pain  Other low back pain  Pain in left hip  Muscle weakness (generalized)  Other muscle spasm  Unspecified lack of coordination  Cramp and spasm  Rationale for Evaluation and Treatment Rehabilitation  PERTINENT HISTORY: IBS, migraines, G2P2 with significant tearing  PRECAUTIONS: NA  SUBJECTIVE:  SUBJECTIVE STATEMENT:  Pt was on cycle last week so is feeling pretty good this week. She feels like it may not have been quite as bad around ovulation last month. When she did get crampy, she felt like stretches were helpful. She is having Lt hip/low back pain.    PAIN:  Are you having pain? Yes NPRS scale: 3-4/10 Pain location: bil abdominal; low back pain, Lt hip pain  Pain type: sharp and cramping Pain description: constant   Aggravating factors: bending/twisting  Relieving factors: heat, ibuprofen    09/04/22 SUBJECTIVE STATEMENT: Pt states that she was in PT before due to fecal smearing and she has seen good progress with that. Now, her biggest complaint is lower abdominal cramping. She states that she has some vaginal tenderness. She feels like she is on her period/crampy all the time. She has Lt hip pain that does not get better. She has been having heavier menstrual cycles with lots of clotting. She is on progesterone only birth control.  Fluid intake: Yes: 64 oz a day, caffeine drinks   PAIN:  Are you having pain? Yes NPRS scale: 3/10 Pain location: bil abdominal; low back pain, Lt hip pain  Pain type: sharp and cramping Pain description: constant   Aggravating factors:  uncertain Relieving factors: heat, ibuprofen   PRECAUTIONS: None  WEIGHT BEARING RESTRICTIONS: No  FALLS:  Has patient fallen in last 6 months? No  LIVING ENVIRONMENT: Lives with: lives with their family Lives in: House/apartment   OCCUPATION: nurse at [employer]  PLOF: Independent  PATIENT GOALS: decrease pain  PERTINENT HISTORY:  IBS, migraines, G2P2 with significant tearing Sexual abuse: No  BOWEL MOVEMENT: Pain with bowel movement: No Type of bowel movement:Frequency at least 1x/day - sometimes multiple with ozempic and Strain No Fully empty rectum: No Leakage: No - very rare Pads: No Fiber supplement: No  URINATION: Pain with urination: No Fully empty bladder: Yes: - Stream: Has to bear down a little to start stream Urgency: No Frequency: every 2 hours Leakage: none Pads: No   INTERCOURSE: Pain with intercourse: During Penetration - can feel in Lt butt cheek Ability to have vaginal penetration:  Yes: but can be painful Climax: rarely and needs significant stimulation with vibrator   PREGNANCY: Vaginal deliveries 2 Tearing Yes: significant tear with 2nd 9 years ago C-section deliveries 0 Currently pregnant No  PROLAPSE: Cystocele - and Rectocele -   OBJECTIVE:  09/04/22:  DIAGNOSTIC FINDINGS:  Abdominal US  COGNITION: Overall cognitive status: Within functional limits for tasks assessed     SENSATION: Light touch: Appears intact Proprioception: Appears intact  FUNCTIONAL TESTS:  -Squat: Lt weight shift, Rt rotation -Single leg stance: Rt 4 seconds, Lt 10 seconds  GAIT: Comments: WNL  POSTURE: rounded shoulders, forward head, decreased thoracic kyphosis, and posterior pelvic tilt, Rt shoulder and iliac crest elevation, mild Lt lower thoracic curvature   LUMBARAROM/PROM:  A/PROM A/PROM  Eval (% available)  Flexion 100, very painful  Extension 50, very painful  Right lateral flexion 50  Left lateral flexion 50,  pain on left  Right rotation 50  Left rotation 50   (Blank rows = not tested)  LOWER EXTREMITY MMT:  MMT Right eval Left eval  Hip flexion 4 4  Hip extension 4+ 4+  Hip abduction 4+ 4+  Hip adduction 3+, pain at pubic symphysis 3+, pain at pubic symphysis  Hip internal rotation 4 4  Hip external rotation 4 4   PALPATION:  General  Tenderness throughout bil/central lower abdomen; mild tenderness at upper abdominal rib attachments; pubic symphysis discomfort; tenderness with palpation of low back, sacrum, and coccyx                 External Perineal Exam NA                             Internal Pelvic Floor NA  Patient confirms identification and approves PT to assess internal pelvic floor and treatment Yes - next treatment session   PELVIC MMT:   MMT eval  Vaginal   Internal Anal Sphincter   External Anal Sphincter   Puborectalis   Diastasis Recti 2 finger width separation with distortion upon increased abdominal pressure  (Blank rows = not tested)        TONE: NA  PROLAPSE: NA  TODAY'S TREATMENT                                                                                                                              DATE:  10/27/22 Manual: Trigger Point Dry-Needling  Treatment instructions: Expect mild to moderate muscle soreness. S/S of pneumothorax if dry needled over a lung field, and to seek immediate medical attention should they occur. Patient verbalized understanding of these instructions and education.  Patient Consent Given: Yes Education handout provided: Yes Muscles treated: Lt glutes, Rt UT, Rt cervical paraspinals  Electrical stimulation performed: No Parameters: N/A Treatment response/outcome: twitch response/release Soft tissue mobilization to Lt glutes/Rt upper quadrant Neuromuscular re-education: Bridge march 10x Bridge with hip adduction 2 x 10 Bird dog 10x Fire hydrants 10x Exercises: Clam shells 2 x 10 Side lying hip abduction  10x Cat cow   10/09/22 Manual: Trigger Point Dry-Needling  Treatment instructions: Expect mild to moderate muscle soreness. S/S of pneumothorax if dry needled over a lung field, and to seek immediate medical attention should they occur. Patient verbalized understanding of these instructions and education.  Patient Consent Given: Yes Education handout provided: Yes Muscles treated: Lt glutes Electrical stimulation performed: No Parameters: N/A Treatment response/outcome: twitch response/release Abdominal myofascial release focusing in lower abdomen Neuromuscular re-education: Transversus abdominus training with multimodal cues for improved motor control and breath coordination Supine Bil UE ball press Sidelying UE ball press Exercises: Kneeling hip flexor stretch Bent knee fall out Piriformis stretch supine   09/04/22  EVAL  Neuromuscular re-education: Diaphragmatic breathing Child's pose 10 breaths Happy baby 10 breaths Cat cow 10x Therapeutic activities: Squatty potty Relaxed toilet mechanics Splinting with bowel movement    PATIENT EDUCATION:  Education details: see above Person educated: Patient Education method: Explanation, Demonstration, Actor cues, Verbal cues, and Handouts Education comprehension: verbalized understanding  HOME EXERCISE PROGRAM: [id number]  ASSESSMENT:  CLINICAL IMPRESSION: Pt seeing some progress with abdominal pain. Lt hip very aggravated this treatment. Excellent  tolerance to DN and manual techniques and had 0/10 pain. She was bale to progress  core and hip strengthening without any increase in pain. She will continue to benefit from skilled PT intervention in order to decrease pain, improve core strength/stability, and return to functional activities without difficulty.  OBJECTIVE IMPAIRMENTS: decreased activity tolerance, decreased coordination, decreased endurance, decreased strength, increased fascial restrictions, increased muscle  spasms, impaired tone, postural dysfunction, and pain.   ACTIVITY LIMITATIONS: lifting, bending, sitting, standing, squatting, and sleeping  PARTICIPATION LIMITATIONS: cleaning, interpersonal relationship, community activity, and occupation  PERSONAL FACTORS: 1 comorbidity: medical history are also affecting patient's functional outcome.   REHAB POTENTIAL: Good  CLINICAL DECISION MAKING: Stable/uncomplicated  EVALUATION COMPLEXITY: Low   GOALS: Goals reviewed with patient? Yes  SHORT TERM GOALS: Target date: 10/09/22- updated 10/09/22  Pt will be independent with HEP.   Baseline: Goal status: IN PROGRESS  2.  Pt will be able to correctly perform diaphragmatic breathing and appropriate pressure management in order to prevent worsening vaginal wall laxity and improve pelvic floor A/ROM.   Baseline:  Goal status: IN PROGRESS  3.  Pt will be independent with diaphragmatic breathing and down training activities in order to improve pelvic floor relaxation.  Baseline:  Goal status: IN PROGRESS  4.  Pt will be independent with use of squatty potty, relaxed toileting mechanics, and improved bowel movement techniques in order to increase ease of bowel movements and complete evacuation.   Baseline:  Goal status: IN PROGRESS   LONG TERM GOALS: Target date: 02/19/23 - updated 10/09/22  Pt will be independent with advanced HEP.   Baseline:  Goal status: IN PROGRESS  2.  Pt will be able to perform all work duties without increased pelvic/low back pain.  Baseline:  Goal status: IN PROGRESS  3.  Pt will deny any abdominal cramping greater than 1/10.  Baseline:  Goal status: IN PROGRESS  4.  Pt will be able to have complete bowel movements without straining and use of appropriate mechanics.  Baseline:  Goal status: IN PROGRESS  5.  Pt will report 0/10 pain with vaginal penetration in order to improve intimate relationship with partner.    Baseline:  Goal status: IN  PROGRESS  6.  Pt will increase all impaired lumbar A/ROM by 25% without pain.  Baseline:  Goal status: IN PROGRESS  PLAN:  PT FREQUENCY: 1-2x/week  PT DURATION: 6 months  PLANNED INTERVENTIONS: Therapeutic exercises, Therapeutic activity, Neuromuscular re-education, Balance training, Gait training, Patient/Family education, Self Care, Joint mobilization, Dry Needling, Biofeedback, and Manual therapy  PLAN FOR NEXT SESSION: Continue to consider internal pelvic exam if needed; progress core/hip strengthening into seated and standing positions.   Julio Alm, PT, DPT05/13/241:20 PM

## 2022-10-28 DIAGNOSIS — G4733 Obstructive sleep apnea (adult) (pediatric): Secondary | ICD-10-CM | POA: Diagnosis not present

## 2022-10-28 NOTE — Progress Notes (Unsigned)
PATIENT: Abigail Frank DOB: 27-Jul-1985  REASON FOR VISIT: follow up HISTORY FROM: patient  No chief complaint on file.  Ahern: Migraines Athar: sleep   HISTORY OF PRESENT ILLNESS:  10/28/22 ALL:  Abigail Frank is a 37 y.o. female here today for follow up for OSA on CPAP. She is followed by Dr Lucia Gaskins and Ihor Austin for migraines and Dr Frances Furbish for OSA. She was seen in consult with Dr Frances Furbish 12/2021 for concerns of snoring, daytime sleepiness and difficulty maintaining sleep. PSG showed moderate OSA with total AHI of 18.6/h and O2 nadir of 75%.   She was last seen by Ihor Austin 10/2021. Migraines were well managed on Amovig monthly, gabapentin 300mg  BID and baclofen, rizatriptan and ondansetron as needed.     HISTORY: (copied from Dr Teofilo Pod previous note)  Dear Shanda Bumps and Desma Maxim,    I saw your patient, Abigail Frank, upon your kind request in my sleep clinic today for initial consultation of her sleep disorder, in particular, concern for underlying obstructive sleep apnea.  The patient is unaccompanied today.  As you know, Abigail Frank is a 37 year old right-handed woman with an underlying medical history of migraine headaches, diabetes, hyperlipidemia, irritable bowel syndrome, history of kidney stones, anxiety, depression, asthma, and obesity, who reports snoring and excessive daytime somnolence as well as difficulty maintaining sleep at night.  When she cannot sleep at night she often eats a snack.  She lives with her family including husband and 2 children, ages 41 and 51.  Her 49-year-old daughter has epilepsy and also sleep disruption.  Patient has a white noise machine in her bedroom, she does have a TV in there but does not use it at night.  Bedtime is generally between 9 and 10 PM and rise time around 7 AM.  She has been working on weight loss and has lost altogether about 30 pounds but had some recent weight gain.  She works at [employer] as a Charity fundraiser.   She works typically 2 shifts a week, from 9 AM to 9 PM.  She drinks caffeine in the form of coffee, 1 large cup per day, alcohol rarely, she is a non-smoker.  They have currently 5 indoor cats which they foster, they have 2 outdoor dogs.  She reports a family history of sleep apnea, her father has a CPAP machine and mom had a machine, mom passed away at age 54 from congestive heart failure.  Patient has woken up often with a headache, she does have degenerative neck disease and neck pain but often her morning headaches are migraine-like.  For as needed use, she takes Maxalt, baclofen, and ibuprofen.  She takes Aimovig injections monthly for migraine prevention.  For sleep, she takes alprazolam low-dose at bedtime and sometimes she takes hydroxyzine for anxiety but it does make her drowsy.  I reviewed your office note from 10/24/2021.  Her Epworth sleepiness score is 9 out of 24, fatigue severity score is 34 out of 63.  REVIEW OF SYSTEMS: Out of a complete 14 system review of symptoms, the patient complains only of the following symptoms, and all other reviewed systems are negative.  ESS:  ALLERGIES: Allergies  Allergen Reactions   Bactrim [Sulfamethoxazole-Trimethoprim] Hives   Hydrocodone     Other Reaction(s): severe stomach cramps, nausea and vomitting   Sulfa Antibiotics Hives   Vicodin [Hydrocodone-Acetaminophen] Nausea And Vomiting    HOME MEDICATIONS: Outpatient Medications Prior to Visit  Medication Sig Dispense Refill  albuterol (PROVENTIL HFA;VENTOLIN HFA) 108 (90 BASE) MCG/ACT inhaler Inhale 2 puffs into the lungs every 6 (six) hours as needed for wheezing or shortness of breath.     albuterol (VENTOLIN HFA) 108 (90 Base) MCG/ACT inhaler Inhale 2 puffs into the lungs every 4 (four) hours as needed. 6.7 g 2   Albuterol Sulfate (PROAIR RESPICLICK) 108 (90 Base) MCG/ACT AEPB Take 2 puffs by mouth 4 (four) times daily as needed for shortness of breath. 1 each 2   ALPRAZolam (XANAX) 0.5  MG tablet Take 1 tablet (0.5 mg total) by mouth 2 (two) times daily as needed for anxiety 30 tablet 3   baclofen (LIORESAL) 10 MG tablet Take 1 tablet (10 mg total) by mouth daily as needed for muscle spasms (acute migraine onset). 30 tablet 5   Blood Glucose Monitoring Suppl (FREESTYLE LITE) w/Device KIT Use to test blood sugars daily 1 kit 0   citalopram (CELEXA) 40 MG tablet Take 1 tablet (40 mg total) by mouth daily. 90 tablet 3   COVID-19 At Home Antigen Test (CARESTART COVID-19 HOME TEST) KIT Use as directed within package instructions. 4 each 0   Erenumab-aooe 140 MG/ML SOAJ INJECT 140 MG (1 pen) INTO THE SKIN EVERY 30 DAYS. 1 mL 11   fluticasone furoate-vilanterol (BREO ELLIPTA) 200-25 MCG/ACT AEPB Inhale 1 puff into the lungs once a day. 60 each 3   gabapentin (NEURONTIN) 300 MG capsule Take 1 capsule (300 mg total) by mouth 2 (two) times daily. 180 capsule 0   glucose blood (FREESTYLE LITE) test strip Use to test blood sugars daily 100 each 5   hydrOXYzine (ATARAX) 25 MG tablet Take 1 tablet (25 mg total) by mouth at bedtime as needed. 30 tablet 1   ibuprofen (ADVIL) 600 MG tablet Take 600 mg by mouth every 6 (six) hours as needed.     indapamide (LOZOL) 1.25 MG tablet TAKE 1 TABLET BY MOUTH ONCE A DAY. 90 tablet 1   Lancets (FREESTYLE) lancets Use to test blood sugars daily 100 each 5   MELATONIN PO Take 3 mg by mouth as needed.     metoCLOPramide (REGLAN) 10 MG tablet Take 1 tablet by mouth three times a day while having migraine or nausea. 90 tablet 5   norethindrone (MICRONOR) 0.35 MG tablet Take 1 tablet by mouth daily. 84 tablet 4   ondansetron (ZOFRAN-ODT) 4 MG disintegrating tablet Dissolve 1 tablet by mouth every 6 hours as needed for nausea or for headache. 30 tablet 11   rizatriptan (MAXALT-MLT) 10 MG disintegrating tablet DISSOLVE 1 TABLET BY MOUTH AS NEEDED FOR MIGRAINE, MAY REPEAT IN 2 HOURS IF NEEDED 9 tablet 4   tiZANidine (ZANAFLEX) 2 MG tablet Take 1 tablet (2 mg total)  by mouth every 6 (six) hours as needed for muscle spasms (migraine relief). 90 tablet 3   Facility-Administered Medications Prior to Visit  Medication Dose Route Frequency Provider Last Rate Last Admin   triamcinolone acetonide (KENALOG-40) injection 10 mg  10 mg Intramuscular Once Marguerita Beards, MD        PAST MEDICAL HISTORY: Past Medical History:  Diagnosis Date   Anxiety    Asthma    Bone spur    cervical L side; L side arm numbness intermittent    Depression    Diabetes in pregnancy    Diabetes type 2, controlled (HCC)    Elevated cholesterol with high triglycerides    Gestational diabetes    Hx of varicella    IBS (  irritable bowel syndrome)    Kidney stones    followed by Dr. Kathrynn Running   Migraines    without aura   Ovarian cyst    Postpartum care following vaginal delivery (1/30) 07/15/2013   Preterm uterine contractions 06/10/2013   Sleep apnea    Vitamin D deficiency     PAST SURGICAL HISTORY: Past Surgical History:  Procedure Laterality Date   MOUTH SURGERY     wisdom teeth extractions    FAMILY HISTORY: Family History  Problem Relation Age of Onset   Hypertension Mother    Hypothyroidism Mother    Heart attack Mother    Diabetes Mother    Heart disease Mother        heart failure   Dementia Mother    Kidney disease Mother    Sleep apnea Mother    Hypertension Father    Hypothyroidism Father    Diabetes Father    Obesity Father    Sleep apnea Father    Cancer Maternal Grandmother        colon   Dementia Maternal Grandmother    Hypertension Maternal Grandmother    Colon cancer Maternal Grandmother    Early death Maternal Grandfather 89       ?pulmonary disease   Pulmonary fibrosis Maternal Grandfather    Seizures Child        thinks its genetic, only seizes if she hits her head,thinks its migraine related    Migraines Neg Hx     SOCIAL HISTORY: Social History   Socioeconomic History   Marital status: Married    Spouse name: Not  on file   Number of children: 2   Years of education: Not on file   Highest education level: Bachelor's degree (e.g., BA, AB, BS)  Occupational History   Not on file  Tobacco Use   Smoking status: Never   Smokeless tobacco: Never  Vaping Use   Vaping Use: Never used  Substance and Sexual Activity   Alcohol use: Yes    Comment: on occasion a glass of wine   Drug use: Never   Sexual activity: Yes    Birth control/protection: OCP  Other Topics Concern   Not on file  Social History Narrative   Lives at home with her family   Left handed   Caffeine: regular, at least 2 cups of coffee daily   Social Determinants of Health   Financial Resource Strain: Not on file  Food Insecurity: Not on file  Transportation Needs: Not on file  Physical Activity: Not on file  Stress: Not on file  Social Connections: Not on file  Intimate Partner Violence: Not on file     PHYSICAL EXAM  There were no vitals filed for this visit. There is no height or weight on file to calculate BMI.  Generalized: Well developed, in no acute distress  Cardiology: normal rate and rhythm, no murmur noted Respiratory: clear to auscultation bilaterally  Neurological examination  Mentation: Alert oriented to time, place, history taking. Follows all commands speech and language fluent Cranial nerve II-XII: Pupils were equal round reactive to light. Extraocular movements were full, visual field were full  Motor: The motor testing reveals 5 over 5 strength of all 4 extremities. Good symmetric motor tone is noted throughout.  Gait and station: Gait is normal.    DIAGNOSTIC DATA (LABS, IMAGING, TESTING) - I reviewed patient records, labs, notes, testing and imaging myself where available.      No data to display  Lab Results  Component Value Date   WBC 8.2 10/31/2019   HGB 13.0 10/31/2019   HCT 41.4 10/31/2019   MCV 86 10/31/2019   PLT 281 10/31/2019      Component Value Date/Time   NA 141  10/31/2019 1518   K 4.5 10/31/2019 1518   CL 102 10/31/2019 1518   CO2 25 10/31/2019 1518   GLUCOSE 76 10/31/2019 1518   GLUCOSE 151 (H) 06/16/2017 2322   BUN 11 10/31/2019 1518   CREATININE 0.69 10/31/2019 1518   CALCIUM 9.2 10/31/2019 1518   PROT 6.8 10/31/2019 1518   ALBUMIN 4.5 10/31/2019 1518   AST 21 10/31/2019 1518   ALT 24 10/31/2019 1518   ALKPHOS 104 10/31/2019 1518   BILITOT <0.2 10/31/2019 1518   GFRNONAA 115 10/31/2019 1518   GFRAA 132 10/31/2019 1518   Lab Results  Component Value Date   CHOL 181 09/17/2022   HDL 40 09/17/2022   LDLCALC 104 (H) 09/17/2022   TRIG 213 (H) 09/17/2022   Lab Results  Component Value Date   HGBA1C 6.2 (H) 10/31/2019   Lab Results  Component Value Date   VITAMINB12 457 09/17/2022   Lab Results  Component Value Date   TSH 2.280 09/17/2022     ASSESSMENT AND PLAN 37 y.o. year old female  has a past medical history of Anxiety, Asthma, Bone spur, Depression, Diabetes in pregnancy, Diabetes type 2, controlled (HCC), Elevated cholesterol with high triglycerides, Gestational diabetes, varicella, IBS (irritable bowel syndrome), Kidney stones, Migraines, Ovarian cyst, Postpartum care following vaginal delivery (1/30) (07/15/2013), Preterm uterine contractions (06/10/2013), Sleep apnea, and Vitamin D deficiency. here with   No diagnosis found.    Judeth Cornfield I Privott is doing well on CPAP therapy. Compliance report reveals ***. *** was encouraged to continue using CPAP nightly and for greater than 4 hours each night. We will update supply orders as indicated. Risks of untreated sleep apnea review and education materials provided. Healthy lifestyle habits encouraged. *** will follow up in ***, sooner if needed. *** verbalizes understanding and agreement with this plan.    No orders of the defined types were placed in this encounter.    No orders of the defined types were placed in this encounter.     Shawnie Dapper, FNP-C 10/28/2022, 1:28  PM [facility] [address] [place], [place] [address] [phone]

## 2022-10-29 ENCOUNTER — Ambulatory Visit: Payer: Commercial Managed Care - PPO | Admitting: Family Medicine

## 2022-10-29 ENCOUNTER — Other Ambulatory Visit: Payer: Self-pay

## 2022-10-29 ENCOUNTER — Encounter: Payer: Self-pay | Admitting: Family Medicine

## 2022-10-29 ENCOUNTER — Encounter: Payer: Self-pay | Admitting: Nurse Practitioner

## 2022-10-29 ENCOUNTER — Other Ambulatory Visit (HOSPITAL_COMMUNITY): Payer: Self-pay

## 2022-10-29 ENCOUNTER — Ambulatory Visit: Payer: Commercial Managed Care - PPO | Admitting: Nurse Practitioner

## 2022-10-29 VITALS — BP 112/66 | HR 80 | Ht 62.0 in | Wt 209.0 lb

## 2022-10-29 VITALS — BP 105/73 | HR 78 | Temp 98.1°F | Ht 62.0 in | Wt 204.0 lb

## 2022-10-29 DIAGNOSIS — G4733 Obstructive sleep apnea (adult) (pediatric): Secondary | ICD-10-CM

## 2022-10-29 DIAGNOSIS — E119 Type 2 diabetes mellitus without complications: Secondary | ICD-10-CM | POA: Diagnosis not present

## 2022-10-29 DIAGNOSIS — Z7985 Long-term (current) use of injectable non-insulin antidiabetic drugs: Secondary | ICD-10-CM | POA: Diagnosis not present

## 2022-10-29 DIAGNOSIS — Z6837 Body mass index (BMI) 37.0-37.9, adult: Secondary | ICD-10-CM

## 2022-10-29 DIAGNOSIS — G43709 Chronic migraine without aura, not intractable, without status migrainosus: Secondary | ICD-10-CM | POA: Diagnosis not present

## 2022-10-29 DIAGNOSIS — E669 Obesity, unspecified: Secondary | ICD-10-CM

## 2022-10-29 DIAGNOSIS — M542 Cervicalgia: Secondary | ICD-10-CM

## 2022-10-29 MED ORDER — SEMAGLUTIDE (1 MG/DOSE) 4 MG/3ML ~~LOC~~ SOPN
1.0000 mg | PEN_INJECTOR | SUBCUTANEOUS | 0 refills | Status: DC
Start: 2022-10-29 — End: 2022-12-23
  Filled 2022-10-29: qty 3, 28d supply, fill #0

## 2022-10-29 MED ORDER — KETOROLAC TROMETHAMINE 60 MG/2ML IM SOLN
60.0000 mg | Freq: Once | INTRAMUSCULAR | Status: AC
Start: 2022-10-29 — End: 2022-10-29
  Administered 2022-10-29: 60 mg via INTRAMUSCULAR

## 2022-10-29 NOTE — Patient Instructions (Addendum)
Below is our plan:  We will continue Amovig every 30 days and gabapentin 300mg  1-2 times daily. Continue baclofen, rizatriptan and ondansetron as needed. We will give you Toradol 60mg  in the office. Do not take any more Nsaids today.   Please continue using your CPAP regularly. While your insurance requires that you use CPAP at least 4 hours each night on 70% of the nights, I recommend, that you not skip any nights and use it throughout the night if you can. Getting used to CPAP and staying with the treatment long term does take time and patience and discipline. Untreated obstructive sleep apnea when it is moderate to severe can have an adverse impact on cardiovascular health and raise her risk for heart disease, arrhythmias, hypertension, congestive heart failure, stroke and diabetes. Untreated obstructive sleep apnea causes sleep disruption, nonrestorative sleep, and sleep deprivation. This can have an impact on your day to day functioning and cause daytime sleepiness and impairment of cognitive function, memory loss, mood disturbance, and problems focussing. Using CPAP regularly can improve these symptoms.  I will send an order for mask refitting. Please discuss with DME. Consistency is the key.   Please make sure you are staying well hydrated. I recommend 50-60 ounces daily. Well balanced diet and regular exercise encouraged. Consistent sleep schedule with 6-8 hours recommended.   Please continue follow up with care team as directed.   Follow up with Shanda Bumps in 4 months   You may receive a survey regarding today's visit. I encourage you to leave honest feed back as I do use this information to improve patient care. Thank you for seeing me today!

## 2022-10-29 NOTE — Progress Notes (Signed)
Office: [phone]  /  Fax: [phone]  WEIGHT SUMMARY AND BIOMETRICS  Weight Lost Since Last Visit: 3lb  No data recorded  Vitals Temp: 98.1 F (36.7 C) BP: 105/73 Pulse Rate: 78 SpO2: 97 %   Anthropometric Measurements Height: 5\' 2"  (1.575 m) Weight: 204 lb (92.5 kg) BMI (Calculated): 37.3 Weight at Last Visit: 207lb Weight Lost Since Last Visit: 3lb Starting Weight: 208lb Total Weight Loss (lbs): 4 lb (1.814 kg)   Body Composition  Body Fat %: 44.1 % Fat Mass (lbs): 90.2 lbs Muscle Mass (lbs): 108.6 lbs Total Body Water (lbs): 77.8 lbs Visceral Fat Rating : 10   Other Clinical Data Fasting: No Labs: No Today's Visit #: 4 Starting Date: 09/17/22     HPI  Chief Complaint: OBESITY  Abigail Frank is here to discuss her progress with her obesity treatment plan. She is on the the Category 2 Plan and states she is following her eating plan approximately 100 % of the time. She states she is exercising 20 minutes 5 days per week.   Interval History:  Since last office visit she has lost 3 pounds. She is doing well with cat 2 plan. She is avearging around 1200-1400 calories and 60-80 grams of protein.  Did cat 3 in the past and wasn't losing weight so switched to cat 2 and is doing better.   She is trying to aim for 30 grams of protein with each meal.  She snacks on nuts, hummus, greek yogurt, cheese and cottage cheese. She is under stress but denies stress eating. Occ hunger and cravings especially at night.     Pharmacotherapy for weight loss: She is not currently taking medications  for medical weight loss.   Previous pharmacotherapy for medical weight loss:  None  Bariatric surgery:  Patient has not had bariatric surgery.   Pharmacotherapy for DMT2:  She is currently taking Ozempic 0.5mg .  Denies side effects.   Last A1c was 6.2 She is not checking BS at home.   Episodes of hypoglycemia: no On ACE or ARB, ASA 81mg  and statin.  Last eye exam:   2023 She has tried glyburide in the past.    Lab Results  Component Value Date   HGBA1C 6.2 (H) 10/31/2019   Lab Results  Component Value Date   LDLCALC 104 (H) 09/17/2022   CREATININE 0.69 10/31/2019     Obstructive Sleep Apnea Kaiyla has a diagnosis of sleep apnea. She reports that she is not using a CPAP regularly.  Saw neurology today.   PHYSICAL EXAM:  Blood pressure 105/73, pulse 78, temperature 98.1 F (36.7 C), height 5\' 2"  (1.575 m), weight 204 lb (92.5 kg), SpO2 97 %. Body mass index is 37.31 kg/m.  General: She is overweight, cooperative, alert, well developed, and in no acute distress. PSYCH: Has normal mood, affect and thought process.   Extremities: No edema.  Neurologic: No gross sensory or motor deficits. No tremors or fasciculations noted.    DIAGNOSTIC DATA REVIEWED:  BMET    Component Value Date/Time   NA 141 10/31/2019 1518   K 4.5 10/31/2019 1518   CL 102 10/31/2019 1518   CO2 25 10/31/2019 1518   GLUCOSE 76 10/31/2019 1518   GLUCOSE 151 (H) 06/16/2017 2322   BUN 11 10/31/2019 1518   CREATININE 0.69 10/31/2019 1518   CALCIUM 9.2 10/31/2019 1518   GFRNONAA 115 10/31/2019 1518   GFRAA 132 10/31/2019 1518   Lab Results  Component Value Date   HGBA1C 6.2 (  H) 10/31/2019   Lab Results  Component Value Date   INSULIN 21.5 09/17/2022   Lab Results  Component Value Date   TSH 2.280 09/17/2022   CBC    Component Value Date/Time   WBC 8.2 10/31/2019 1518   WBC 8.5 07/14/2017 1209   RBC 4.84 10/31/2019 1518   RBC 5.12 (H) 07/14/2017 1209   HGB 13.0 10/31/2019 1518   HCT 41.4 10/31/2019 1518   PLT 281 10/31/2019 1518   MCV 86 10/31/2019 1518   MCH 26.9 10/31/2019 1518   MCH 27.9 06/16/2017 2322   MCHC 31.4 (L) 10/31/2019 1518   MCHC 33.6 07/14/2017 1209   RDW 13.2 10/31/2019 1518   Iron Studies    Component Value Date/Time   IRON 40 10/31/2019 1518   TIBC 333 10/31/2019 1518   FERRITIN 60 10/31/2019 1518   IRONPCTSAT 12 (L)  10/31/2019 1518   Lipid Panel     Component Value Date/Time   CHOL 181 09/17/2022 1042   TRIG 213 (H) 09/17/2022 1042   HDL 40 09/17/2022 1042   LDLCALC 104 (H) 09/17/2022 1042   Hepatic Function Panel     Component Value Date/Time   PROT 6.8 10/31/2019 1518   ALBUMIN 4.5 10/31/2019 1518   AST 21 10/31/2019 1518   ALT 24 10/31/2019 1518   ALKPHOS 104 10/31/2019 1518   BILITOT <0.2 10/31/2019 1518      Component Value Date/Time   TSH 2.280 09/17/2022 1042   Nutritional No results found for: "VD25OH"   ASSESSMENT AND PLAN  TREATMENT PLAN FOR OBESITY:  Recommended Dietary Goals  Theres is currently in the action stage of change. As such, her goal is to continue weight management plan. She has agreed to the Category 2 Plan.  Behavioral Intervention  We discussed the following Behavioral Modification Strategies today: increasing lean protein intake, decreasing simple carbohydrates , increasing vegetables, increasing lower glycemic fruits, increasing fiber rich foods, avoiding skipping meals, increasing water intake, continue to practice mindfulness when eating, and planning for success.  Additional resources provided today: NA  Recommended Physical Activity Goals  Breiana has been advised to work up to 150 minutes of moderate intensity aerobic activity a week and strengthening exercises 2-3 times per week for cardiovascular health, weight loss maintenance and preservation of muscle mass.   She has agreed to Continue current level of physical activity  and Think about ways to increase daily physical activity and overcoming barriers to exercise   ASSOCIATED CONDITIONS ADDRESSED TODAY  Action/Plan  Controlled type 2 diabetes mellitus without complication, without long-term current use of insulin (HCC) -     Increase Semaglutide (1 MG/DOSE); Inject 1 mg into the skin once a week.  Dispense: 3 mL; Refill: 0. Side effects discussed  Good blood sugar control is  important to decrease the likelihood of diabetic complications such as nephropathy, neuropathy, limb loss, blindness, coronary artery disease, and death. Intensive lifestyle modification including diet, exercise and weight loss are the first line of treatment for diabetes.    Obstructive sleep apnea syndrome Encouraged to use CPAP nightly.  Continue to follow up with neurology.    Generalized obesity  BMI 37.0-37.9, adult         Return in about 2 weeks (around 11/12/2022).Marland Kitchen She was informed of the importance of frequent follow up visits to maximize her success with intensive lifestyle modifications for her multiple health conditions.   ATTESTASTION STATEMENTS:  Reviewed by clinician on day of visit: allergies, medications, problem list, medical  history, surgical history, family history, social history, and previous encounter notes.     Theodis Sato. Ashaya Raftery FNP-C

## 2022-11-03 ENCOUNTER — Ambulatory Visit: Payer: Commercial Managed Care - PPO

## 2022-11-03 DIAGNOSIS — R252 Cramp and spasm: Secondary | ICD-10-CM | POA: Diagnosis not present

## 2022-11-03 DIAGNOSIS — M5459 Other low back pain: Secondary | ICD-10-CM

## 2022-11-03 DIAGNOSIS — R279 Unspecified lack of coordination: Secondary | ICD-10-CM

## 2022-11-03 DIAGNOSIS — M25552 Pain in left hip: Secondary | ICD-10-CM | POA: Diagnosis not present

## 2022-11-03 DIAGNOSIS — M6281 Muscle weakness (generalized): Secondary | ICD-10-CM

## 2022-11-03 DIAGNOSIS — M62838 Other muscle spasm: Secondary | ICD-10-CM | POA: Diagnosis not present

## 2022-11-03 DIAGNOSIS — R102 Pelvic and perineal pain: Secondary | ICD-10-CM

## 2022-11-03 NOTE — Therapy (Signed)
OUTPATIENT PHYSICAL THERAPY TREATMENT NOTE   Patient Name: Abigail Frank MRN: [id number] DOB:Nov 12, 1985, 37 y.o., female Today's Date: 11/03/2022  PCP: Gaspar Garbe, MD REFERRING PROVIDER: Jerene Bears, MD   END OF SESSION:   PT End of Session - 11/03/22 0848     Visit Number 4    Date for PT Re-Evaluation 02/19/23    Authorization Type [employer]    PT Start Time 0845    PT Stop Time 0925    PT Time Calculation (min) 40 min    Activity Tolerance Patient tolerated treatment well    Behavior During Therapy [facility] for tasks assessed/performed              Past Medical History:  Diagnosis Date   Anxiety    Asthma    Bone spur    cervical L side; L side arm numbness intermittent    Depression    Diabetes in pregnancy    Diabetes type 2, controlled (HCC)    Elevated cholesterol with high triglycerides    Gestational diabetes    Hx of varicella    IBS (irritable bowel syndrome)    Kidney stones    followed by Dr. Kathrynn Running   Migraines    without aura   Ovarian cyst    Postpartum care following vaginal delivery (1/30) 07/15/2013   Preterm uterine contractions 06/10/2013   Sleep apnea    Vitamin D deficiency    Past Surgical History:  Procedure Laterality Date   MOUTH SURGERY     wisdom teeth extractions   Patient Active Problem List   Diagnosis Date Noted   BMI 37.0-37.9, adult 10/15/2022   Low serum cortisol level 10/01/2022   Polyphagia 10/01/2022   Other fatigue 09/17/2022   SOB (shortness of breath) on exertion 09/17/2022   Vitamin D deficiency 09/17/2022   Health care maintenance 09/17/2022   BMI 38.0-38.9,adult 09/17/2022   Striae 09/17/2022   Vitamin B 12 deficiency 09/17/2022   Hypertriglyceridemia 09/17/2022   Generalized obesity 07/01/2021   History of menorrhagia 07/01/2021   Fecal smearing 07/01/2021   Anxiety 07/01/2021   Diabetes type 2, controlled (HCC) 06/28/2021   Carpal tunnel syndrome of left wrist 12/26/2018    Chronic migraine without aura without status migrainosus, not intractable 10/09/2018    REFERRING DIAG: M62.89 (ICD-10-CM) - Pelvic floor dysfunction R10.2 (ICD-10-CM) - Pelvic pain  THERAPY DIAG:  Pelvic pain  Other low back pain  Pain in left hip  Muscle weakness (generalized)  Other muscle spasm  Unspecified lack of coordination  Cramp and spasm  Rationale for Evaluation and Treatment Rehabilitation  PERTINENT HISTORY: IBS, migraines, G2P2 with significant tearing  PRECAUTIONS: NA  SUBJECTIVE:  SUBJECTIVE STATEMENT:  Pt states that Lt hip/tailbone is angry this morning. She states that stretches are helpful for several minutes, but then when she is up and walking around tailbone pain comes back.    PAIN:  Are you having pain? Yes NPRS scale: 3-4/10 Pain location: bil abdominal; low back pain, Lt hip pain  Pain type: sharp and cramping Pain description: constant   Aggravating factors: bending/twisting  Relieving factors: heat, ibuprofen    09/04/22 SUBJECTIVE STATEMENT: Pt states that she was in PT before due to fecal smearing and she has seen good progress with that. Now, her biggest complaint is lower abdominal cramping. She states that she has some vaginal tenderness. She feels like she is on her period/crampy all the time. She has Lt hip pain that does not get better. She has been having heavier menstrual cycles with lots of clotting. She is on progesterone only birth control.  Fluid intake: Yes: 64 oz a day, caffeine drinks   PAIN:  Are you having pain? Yes NPRS scale: 3/10 Pain location: bil abdominal; low back pain, Lt hip pain  Pain type: sharp and cramping Pain description: constant   Aggravating factors: uncertain Relieving factors: heat, ibuprofen   PRECAUTIONS:  None  WEIGHT BEARING RESTRICTIONS: No  FALLS:  Has patient fallen in last 6 months? No  LIVING ENVIRONMENT: Lives with: lives with their family Lives in: House/apartment   OCCUPATION: nurse at [employer]  PLOF: Independent  PATIENT GOALS: decrease pain  PERTINENT HISTORY:  IBS, migraines, G2P2 with significant tearing Sexual abuse: No  BOWEL MOVEMENT: Pain with bowel movement: No Type of bowel movement:Frequency at least 1x/day - sometimes multiple with ozempic and Strain No Fully empty rectum: No Leakage: No - very rare Pads: No Fiber supplement: No  URINATION: Pain with urination: No Fully empty bladder: Yes: - Stream: Has to bear down a little to start stream Urgency: No Frequency: every 2 hours Leakage: none Pads: No   INTERCOURSE: Pain with intercourse: During Penetration - can feel in Lt butt cheek Ability to have vaginal penetration:  Yes: but can be painful Climax: rarely and needs significant stimulation with vibrator   PREGNANCY: Vaginal deliveries 2 Tearing Yes: significant tear with 2nd 9 years ago C-section deliveries 0 Currently pregnant No  PROLAPSE: Cystocele - and Rectocele -   OBJECTIVE:  11/03/22 No emotional/communication barriers or cognitive limitation. Patient is motivated to learn. Patient understands and agrees with treatment goals and plan. PT explains patient will be examined in standing, sitting, and lying down to see how their muscles and joints work. When they are ready, they will be asked to remove their underwear so PT can examine their perineum. The patient is also given the option of providing their own chaperone as one is not provided in our facility. The patient also has the right and is explained the right to defer or refuse any part of the evaluation or treatment including the internal exam. With the patient's consent, PT will use one gloved finger to gently assess the muscles of the pelvic floor, seeing  how well it contracts and relaxes and if there is muscle symmetry. After, the patient will get dressed and PT and patient will discuss exam findings and plan of care. PT and patient discuss plan of care, schedule, attendance policy and HEP activities.                External Perineal Exam WNL  Internal Pelvic Floor Disocmfort and high  Patient confirms identification and approves PT to assess internal pelvic floor and treatment Yes - next treatment session   PELVIC MMT:   MMT eval  Vaginal 3/5  Internal Anal Sphincter   External Anal Sphincter   Puborectalis 2/5  Diastasis Recti 2 finger width separation with distortion upon increased abdominal pressure  (Blank rows = not tested)        TONE: High  09/04/22:  DIAGNOSTIC FINDINGS:  Abdominal US  COGNITION: Overall cognitive status: Within functional limits for tasks assessed     SENSATION: Light touch: Appears intact Proprioception: Appears intact  FUNCTIONAL TESTS:  -Squat: Lt weight shift, Rt rotation -Single leg stance: Rt 4 seconds, Lt 10 seconds  GAIT: Comments: WNL  POSTURE: rounded shoulders, forward head, decreased thoracic kyphosis, and posterior pelvic tilt, Rt shoulder and iliac crest elevation, mild Lt lower thoracic curvature   LUMBARAROM/PROM:  A/PROM A/PROM  Eval (% available)  Flexion 100, very painful  Extension 50, very painful  Right lateral flexion 50  Left lateral flexion 50, pain on left  Right rotation 50  Left rotation 50   (Blank rows = not tested)  LOWER EXTREMITY MMT:  MMT Right eval Left eval  Hip flexion 4 4  Hip extension 4+ 4+  Hip abduction 4+ 4+  Hip adduction 3+, pain at pubic symphysis 3+, pain at pubic symphysis  Hip internal rotation 4 4  Hip external rotation 4 4   PALPATION:   General  Tenderness throughout bil/central lower abdomen; mild tenderness at upper abdominal rib attachments; pubic symphysis discomfort; tenderness with  palpation of low back, sacrum, and coccyx    TODAY'S TREATMENT                                                                                                                              DATE:  11/03/22 Manual: Pt provides verbal consent for internal vaginal/rectal pelvic floor exam. Internal vaginal pelvic floor muscle assessment Vaginal pelvic floor muscle release Neuromuscular re-education: Diaphragmatic breathing/relaxation awareness Cat cow 2 x 10 Child's pose 10 breaths Happy baby 10 breaths  Therapeutic activities: Urge drill Self massage with tennis ball   10/27/22 Manual: Trigger Point Dry-Needling  Treatment instructions: Expect mild to moderate muscle soreness. S/S of pneumothorax if dry needled over a lung field, and to seek immediate medical attention should they occur. Patient verbalized understanding of these instructions and education.  Patient Consent Given: Yes Education handout provided: Yes Muscles treated: Lt glutes, Rt UT, Rt cervical paraspinals  Electrical stimulation performed: No Parameters: N/A Treatment response/outcome: twitch response/release Soft tissue mobilization to Lt glutes/Rt upper quadrant Neuromuscular re-education: Bridge march 10x Bridge with hip adduction 2 x 10 Bird dog 10x Fire hydrants 10x Exercises: Clam shells 2 x 10 Side lying hip abduction 10x Cat cow   10/09/22 Manual: Trigger Point Dry-Needling  Treatment instructions: Expect mild to moderate muscle soreness. S/S of pneumothorax if dry  needled over a lung field, and to seek immediate medical attention should they occur. Patient verbalized understanding of these instructions and education.  Patient Consent Given: Yes Education handout provided: Yes Muscles treated: Lt glutes Electrical stimulation performed: No Parameters: N/A Treatment response/outcome: twitch response/release Abdominal myofascial release focusing in lower abdomen Neuromuscular  re-education: Transversus abdominus training with multimodal cues for improved motor control and breath coordination Supine Bil UE ball press Sidelying UE ball press Exercises: Kneeling hip flexor stretch Bent knee fall out Piriformis stretch supine   PATIENT EDUCATION:  Education details: see above Person educated: Patient Education method: Programmer, multimedia, Demonstration, Actor cues, Verbal cues, and Handouts Education comprehension: verbalized understanding  HOME EXERCISE PROGRAM: [id number]  ASSESSMENT:  CLINICAL IMPRESSION: Due to persistent tailbone/Lt hip pain with transient benefit from stretches, internal pelvic floor muscle exam/treatment performed this session. Significant pain and tenderness palpated in Rt deep pelvic floor and LT superficial/deep pelvic floor. She tolerated release techniques well. We discussed down training/stretches and relaxation awareness with focus on diaphragmatic breathing. She was given urge drill to help with bowel and bladder urgency. We discussed use of tennis ball externally to perineum and LT hip to help release tight musculature contributing to tailbone pain. She will continue to benefit from skilled PT intervention in order to decrease pain, improve core strength/stability, and return to functional activities without difficulty.  OBJECTIVE IMPAIRMENTS: decreased activity tolerance, decreased coordination, decreased endurance, decreased strength, increased fascial restrictions, increased muscle spasms, impaired tone, postural dysfunction, and pain.   ACTIVITY LIMITATIONS: lifting, bending, sitting, standing, squatting, and sleeping  PARTICIPATION LIMITATIONS: cleaning, interpersonal relationship, community activity, and occupation  PERSONAL FACTORS: 1 comorbidity: medical history are also affecting patient's functional outcome.   REHAB POTENTIAL: Good  CLINICAL DECISION MAKING: Stable/uncomplicated  EVALUATION COMPLEXITY:  Low   GOALS: Goals reviewed with patient? Yes  SHORT TERM GOALS: Target date: 10/09/22- updated 10/09/22  Pt will be independent with HEP.   Baseline: Goal status: IN PROGRESS  2.  Pt will be able to correctly perform diaphragmatic breathing and appropriate pressure management in order to prevent worsening vaginal wall laxity and improve pelvic floor A/ROM.   Baseline:  Goal status: IN PROGRESS  3.  Pt will be independent with diaphragmatic breathing and down training activities in order to improve pelvic floor relaxation.  Baseline:  Goal status: IN PROGRESS  4.  Pt will be independent with use of squatty potty, relaxed toileting mechanics, and improved bowel movement techniques in order to increase ease of bowel movements and complete evacuation.   Baseline:  Goal status: IN PROGRESS   LONG TERM GOALS: Target date: 02/19/23 - updated 10/09/22  Pt will be independent with advanced HEP.   Baseline:  Goal status: IN PROGRESS  2.  Pt will be able to perform all work duties without increased pelvic/low back pain.  Baseline:  Goal status: IN PROGRESS  3.  Pt will deny any abdominal cramping greater than 1/10.  Baseline:  Goal status: IN PROGRESS  4.  Pt will be able to have complete bowel movements without straining and use of appropriate mechanics.  Baseline:  Goal status: IN PROGRESS  5.  Pt will report 0/10 pain with vaginal penetration in order to improve intimate relationship with partner.    Baseline:  Goal status: IN PROGRESS  6.  Pt will increase all impaired lumbar A/ROM by 25% without pain.  Baseline:  Goal status: IN PROGRESS  PLAN:  PT FREQUENCY: 1-2x/week  PT DURATION: 6 months  PLANNED INTERVENTIONS:  Therapeutic exercises, Therapeutic activity, Neuromuscular re-education, Balance training, Gait training, Patient/Family education, Self Care, Joint mobilization, Dry Needling, Biofeedback, and Manual therapy  PLAN FOR NEXT SESSION: Internal rectal  release of pelvic floor/coccyx; posterior hip stretches; address goals    Julio Alm, PT, DPT05/20/2410:34 AM

## 2022-11-11 ENCOUNTER — Ambulatory Visit: Payer: Commercial Managed Care - PPO

## 2022-11-13 ENCOUNTER — Ambulatory Visit: Payer: Commercial Managed Care - PPO | Admitting: Bariatrics

## 2022-11-13 ENCOUNTER — Encounter: Payer: Self-pay | Admitting: Bariatrics

## 2022-11-13 VITALS — BP 107/70 | HR 81 | Temp 97.6°F | Ht 62.0 in | Wt 206.0 lb

## 2022-11-13 DIAGNOSIS — E119 Type 2 diabetes mellitus without complications: Secondary | ICD-10-CM

## 2022-11-13 DIAGNOSIS — Z7985 Long-term (current) use of injectable non-insulin antidiabetic drugs: Secondary | ICD-10-CM

## 2022-11-13 DIAGNOSIS — Z6837 Body mass index (BMI) 37.0-37.9, adult: Secondary | ICD-10-CM | POA: Diagnosis not present

## 2022-11-13 DIAGNOSIS — E781 Pure hyperglyceridemia: Secondary | ICD-10-CM | POA: Diagnosis not present

## 2022-11-13 DIAGNOSIS — E669 Obesity, unspecified: Secondary | ICD-10-CM | POA: Diagnosis not present

## 2022-11-13 NOTE — Progress Notes (Signed)
WEIGHT SUMMARY AND BIOMETRICS  Weight Gained Since Last Visit: 2lb   Vitals Temp: 97.6 F (36.4 C) BP: 107/70 Pulse Rate: 81 SpO2: 98 %   Anthropometric Measurements Height: 5\' 2"  (1.575 m) Weight: 206 lb (93.4 kg) BMI (Calculated): 37.67 Weight at Last Visit: 204lb Weight Gained Since Last Visit: 2lb Starting Weight: 208lb Total Weight Loss (lbs): 2 lb (0.907 kg)   Body Composition  Body Fat %: 44.8 % Fat Mass (lbs): 92.6 lbs Muscle Mass (lbs): 108.4 lbs Total Body Water (lbs): 79.2 lbs Visceral Fat Rating : 11   Other Clinical Data Fasting: yes Labs: no Today's Visit #: 5 Starting Date: 09/17/22    OBESITY Abigail Frank is here to discuss her progress with her obesity treatment plan along with follow-up of her obesity related diagnoses.     Nutrition Plan: the Category 2 plan - 75% adherence.  Current exercise: walking  Interim History:  She is up 2 lbs since her last visit.  Eating all of the food on the plan., Meeting calorie goals., Meeting protein goals., and Water intake is adequate.  Pharmacotherapy: Abigail Frank is on Ozempic 1 mg SQ weekly Adverse side effects: None Hunger is moderately controlled.  Cravings are moderately controlled.  Assessment/Plan:   1. Controlled type 2 diabetes mellitus without complication, without long-term current use of insulin (HCC) HgbA1c is at goal. Last A1c was 6.2 CBGs: Not checking      Episodes of hypoglycemia: no Medication(s): Ozempic 1 mg SQ weekly  Lab Results  Component Value Date   HGBA1C 6.2 (H) 10/31/2019   Lab Results  Component Value Date   LDLCALC 104 (H) 09/17/2022   CREATININE 0.69 10/31/2019   No results found for: "GFR"  Plan: Continue Ozempic 1 mg SQ weekly Continue all other medications.  Will keep all carbohydrates low both sweets and starches.  Will continue exercise regimen to 30 to 60 minutes on most days of the week.  Aim for 7 to 9 hours of sleep nightly.  Eat  more low glycemic index foods.   Hypertriglyceridemia:  LDL is not at goal. Triglycerides are 213.  Medication(s): None. Pt less than 40 years.  Cardiovascular risk factors: dyslipidemia and obesity (BMI >= 30 kg/m2)  Lab Results  Component Value Date   CHOL 181 09/17/2022   HDL 40 09/17/2022   LDLCALC 104 (H) 09/17/2022   TRIG 213 (H) 09/17/2022   Lab Results  Component Value Date   ALT 24 10/31/2019   AST 21 10/31/2019   ALKPHOS 104 10/31/2019   BILITOT <0.2 10/31/2019   The ASCVD Risk score (Arnett DK, et al., 2019) failed to calculate for the following reasons:   The 2019 ASCVD risk score is only valid for ages 39 to 21  Plan:  Continue statin.  Information sheet on healthy vs unhealthy fats.  Will avoid all trans fats.  Will read labels Will minimize saturated fats except the following: low fat meats in moderation, diary, and limited dark chocolate.  Increase Omega 3 in foods, and begin an Omega 3 supplement.         Generalized Obesity: Current BMI BMI (Calculated): 37.67   Pharmacotherapy Plan Continue  Ozempic 0.5 mg SQ weekly  Abigail Frank is currently in the action stage of change. As such, her goal is to continue with weight loss efforts.  She has agreed to the Category 2 plan.  Exercise goals: For substantial health benefits, adults should do at least 150 minutes (2 hours and 30 minutes) a  week of moderate-intensity, or 75 minutes (1 hour and 15 minutes) a week of vigorous-intensity aerobic physical activity, or an equivalent combination of moderate- and vigorous-intensity aerobic activity. Aerobic activity should be performed in episodes of at least 10 minutes, and preferably, it should be spread throughout the week.  Behavioral modification strategies: decreasing simple carbohydrates , increase water intake, avoiding temptations, and mindful eating.  Abigail Frank has agreed to follow-up with our clinic in 3 weeks.      Objective:   VITALS: Per patient if  applicable, see vitals. GENERAL: Alert and in no acute distress. CARDIOPULMONARY: No increased WOB. Speaking in clear sentences.  PSYCH: Pleasant and cooperative. Speech normal rate and rhythm. Affect is appropriate. Insight and judgement are appropriate. Attention is focused, linear, and appropriate.  NEURO: Oriented as arrived to appointment on time with no prompting.   Attestation Statements:    This was prepared with the assistance of Engineer, civil (consulting).  Occasional wrong-word or sound-a-like substitutions may have occurred due to the inherent limitations of voice recognition software.   Corinna Capra, DO

## 2022-11-17 ENCOUNTER — Ambulatory Visit: Payer: Commercial Managed Care - PPO | Attending: Obstetrics & Gynecology

## 2022-11-17 DIAGNOSIS — M6281 Muscle weakness (generalized): Secondary | ICD-10-CM

## 2022-11-17 DIAGNOSIS — R279 Unspecified lack of coordination: Secondary | ICD-10-CM | POA: Diagnosis not present

## 2022-11-17 DIAGNOSIS — R252 Cramp and spasm: Secondary | ICD-10-CM

## 2022-11-17 DIAGNOSIS — R102 Pelvic and perineal pain unspecified side: Secondary | ICD-10-CM

## 2022-11-17 DIAGNOSIS — M5459 Other low back pain: Secondary | ICD-10-CM

## 2022-11-17 DIAGNOSIS — M25552 Pain in left hip: Secondary | ICD-10-CM

## 2022-11-17 DIAGNOSIS — M62838 Other muscle spasm: Secondary | ICD-10-CM | POA: Diagnosis not present

## 2022-11-17 NOTE — Therapy (Signed)
OUTPATIENT PHYSICAL THERAPY TREATMENT NOTE   Patient Name: Abigail Frank MRN: [id number] DOB:Nov 05, 1985, 37 y.o., female Today's Date: 11/17/2022  PCP: Gaspar Garbe, MD REFERRING PROVIDER: Jerene Bears, MD   END OF SESSION:   PT End of Session - 11/17/22 1104     Visit Number 5    Date for PT Re-Evaluation 02/19/23    Authorization Type [employer]    PT Start Time 1102    PT Stop Time 1140    PT Time Calculation (min) 38 min    Activity Tolerance Patient tolerated treatment well    Behavior During Therapy WFL for tasks assessed/performed              Past Medical History:  Diagnosis Date   Anxiety    Asthma    Bone spur    cervical L side; L side arm numbness intermittent    Depression    Diabetes in pregnancy    Diabetes type 2, controlled (HCC)    Elevated cholesterol with high triglycerides    Gestational diabetes    Hx of varicella    IBS (irritable bowel syndrome)    Kidney stones    followed by Dr. Kathrynn Running   Migraines    without aura   Ovarian cyst    Postpartum care following vaginal delivery (1/30) 07/15/2013   Preterm uterine contractions 06/10/2013   Sleep apnea    Vitamin D deficiency    Past Surgical History:  Procedure Laterality Date   MOUTH SURGERY     wisdom teeth extractions   Patient Active Problem List   Diagnosis Date Noted   BMI 37.0-37.9, adult 10/15/2022   Low serum cortisol level 10/01/2022   Polyphagia 10/01/2022   Other fatigue 09/17/2022   SOB (shortness of breath) on exertion 09/17/2022   Vitamin D deficiency 09/17/2022   Health care maintenance 09/17/2022   BMI 38.0-38.9,adult 09/17/2022   Striae 09/17/2022   Vitamin B 12 deficiency 09/17/2022   Hypertriglyceridemia 09/17/2022   Generalized obesity 07/01/2021   History of menorrhagia 07/01/2021   Fecal smearing 07/01/2021   Anxiety 07/01/2021   Diabetes type 2, controlled (HCC) 06/28/2021   Carpal tunnel syndrome of left wrist 12/26/2018    Chronic migraine without aura without status migrainosus, not intractable 10/09/2018    REFERRING DIAG: M62.89 (ICD-10-CM) - Pelvic floor dysfunction R10.2 (ICD-10-CM) - Pelvic pain  THERAPY DIAG:  Pelvic pain  Other low back pain  Pain in left hip  Muscle weakness (generalized)  Other muscle spasm  Unspecified lack of coordination  Cramp and spasm  Rationale for Evaluation and Treatment Rehabilitation  PERTINENT HISTORY: IBS, migraines, G2P2 with significant tearing  PRECAUTIONS: NA  SUBJECTIVE:  SUBJECTIVE STATEMENT: Pt states that she did well after last time. She feels like she is feeling looser in her pelvic floor and she has had husband do some release since last session. She notices that the muscles that are further back in pelvic floor are always more sore.    PAIN:  Are you having pain? Yes NPRS scale: 3-4/10 Pain location: bil abdominal; low back pain, Lt hip pain  Pain type: sharp and cramping Pain description: constant   Aggravating factors: bending/twisting  Relieving factors: heat, ibuprofen    09/04/22 SUBJECTIVE STATEMENT: Pt states that she was in PT before due to fecal smearing and she has seen good progress with that. Now, her biggest complaint is lower abdominal cramping. She states that she has some vaginal tenderness. She feels like she is on her period/crampy all the time. She has Lt hip pain that does not get better. She has been having heavier menstrual cycles with lots of clotting. She is on progesterone only birth control.  Fluid intake: Yes: 64 oz a day, caffeine drinks   PAIN:  Are you having pain? Yes NPRS scale: 3/10 Pain location: bil abdominal; low back pain, Lt hip pain  Pain type: sharp and cramping Pain description: constant   Aggravating factors:  uncertain Relieving factors: heat, ibuprofen   PRECAUTIONS: None  WEIGHT BEARING RESTRICTIONS: No  FALLS:  Has patient fallen in last 6 months? No  LIVING ENVIRONMENT: Lives with: lives with their family Lives in: House/apartment   OCCUPATION: nurse at [employer]  PLOF: Independent  PATIENT GOALS: decrease pain  PERTINENT HISTORY:  IBS, migraines, G2P2 with significant tearing Sexual abuse: No  BOWEL MOVEMENT: Pain with bowel movement: No Type of bowel movement:Frequency at least 1x/day - sometimes multiple with ozempic and Strain No Fully empty rectum: No Leakage: No - very rare Pads: No Fiber supplement: No  URINATION: Pain with urination: No Fully empty bladder: Yes: - Stream: Has to bear down a little to start stream Urgency: No Frequency: every 2 hours Leakage: none Pads: No   INTERCOURSE: Pain with intercourse: During Penetration - can feel in Lt butt cheek Ability to have vaginal penetration:  Yes: but can be painful Climax: rarely and needs significant stimulation with vibrator   PREGNANCY: Vaginal deliveries 2 Tearing Yes: significant tear with 2nd 9 years ago C-section deliveries 0 Currently pregnant No  PROLAPSE: Cystocele - and Rectocele -   OBJECTIVE:  11/03/22 No emotional/communication barriers or cognitive limitation. Patient is motivated to learn. Patient understands and agrees with treatment goals and plan. PT explains patient will be examined in standing, sitting, and lying down to see how their muscles and joints work. When they are ready, they will be asked to remove their underwear so PT can examine their perineum. The patient is also given the option of providing their own chaperone as one is not provided in our facility. The patient also has the right and is explained the right to defer or refuse any part of the evaluation or treatment including the internal exam. With the patient's consent, PT will use one gloved  finger to gently assess the muscles of the pelvic floor, seeing how well it contracts and relaxes and if there is muscle symmetry. After, the patient will get dressed and PT and patient will discuss exam findings and plan of care. PT and patient discuss plan of care, schedule, attendance policy and HEP activities.  External Perineal Exam WNL                             Internal Pelvic Floor Disocmfort and high  Patient confirms identification and approves PT to assess internal pelvic floor and treatment Yes - next treatment session   PELVIC MMT:   MMT eval  Vaginal 3/5  Internal Anal Sphincter   External Anal Sphincter   Puborectalis 2/5  Diastasis Recti 2 finger width separation with distortion upon increased abdominal pressure  (Blank rows = not tested)        TONE: High  09/04/22:  DIAGNOSTIC FINDINGS:  Abdominal US  COGNITION: Overall cognitive status: Within functional limits for tasks assessed     SENSATION: Light touch: Appears intact Proprioception: Appears intact  FUNCTIONAL TESTS:  -Squat: Lt weight shift, Rt rotation -Single leg stance: Rt 4 seconds, Lt 10 seconds  GAIT: Comments: WNL  POSTURE: rounded shoulders, forward head, decreased thoracic kyphosis, and posterior pelvic tilt, Rt shoulder and iliac crest elevation, mild Lt lower thoracic curvature   LUMBARAROM/PROM:  A/PROM A/PROM  Eval (% available)  Flexion 100, very painful  Extension 50, very painful  Right lateral flexion 50  Left lateral flexion 50, pain on left  Right rotation 50  Left rotation 50   (Blank rows = not tested)  LOWER EXTREMITY MMT:  MMT Right eval Left eval  Hip flexion 4 4  Hip extension 4+ 4+  Hip abduction 4+ 4+  Hip adduction 3+, pain at pubic symphysis 3+, pain at pubic symphysis  Hip internal rotation 4 4  Hip external rotation 4 4   PALPATION:   General  Tenderness throughout bil/central lower abdomen; mild tenderness at upper abdominal rib  attachments; pubic symphysis discomfort; tenderness with palpation of low back, sacrum, and coccyx    TODAY'S TREATMENT                                                                                                                              DATE:  11/17/22 Manual: Pt provides verbal consent for internal vaginal/rectal pelvic floor exam. Internal rectal release of pelvic floor Coccyx traction/mobilization   11/03/22 Manual: Pt provides verbal consent for internal vaginal/rectal pelvic floor exam. Internal vaginal pelvic floor muscle assessment Vaginal pelvic floor muscle release Neuromuscular re-education: Diaphragmatic breathing/relaxation awareness Cat cow 2 x 10 Child's pose 10 breaths Happy baby 10 breaths  Therapeutic activities: Urge drill Self massage with tennis ball   10/27/22 Manual: Trigger Point Dry-Needling  Treatment instructions: Expect mild to moderate muscle soreness. S/S of pneumothorax if dry needled over a lung field, and to seek immediate medical attention should they occur. Patient verbalized understanding of these instructions and education.  Patient Consent Given: Yes Education handout provided: Yes Muscles treated: Lt glutes, Rt UT, Rt cervical paraspinals  Electrical stimulation performed: No Parameters: N/A Treatment response/outcome: twitch response/release Soft tissue mobilization to Lt glutes/Rt  upper quadrant Neuromuscular re-education: Bridge march 10x Bridge with hip adduction 2 x 10 Bird dog 10x Fire hydrants 10x Exercises: Clam shells 2 x 10 Side lying hip abduction 10x Cat cow    PATIENT EDUCATION:  Education details: see above Person educated: Patient Education method: Programmer, multimedia, Demonstration, Tactile cues, Verbal cues, and Handouts Education comprehension: verbalized understanding  HOME EXERCISE PROGRAM: [id number]  ASSESSMENT:  CLINICAL IMPRESSION: Pt doing well overall with reduced pain levels, but continues to  have difficulty with lasting benefit from sessions and exercises. Pelvic floor muscle release very helpful last session, therefore we performed rectally this treatment session in order to have better access to coccyx restriction/pain. Coccyx very restricted and significant pain along bil coccyx borders. She reported "good hurt" with mobilization of tissue and coccyx itself. Believe history of fall directly to coccyx cause of this and she will continue to see benefit from release of this tissue. She will continue to benefit from skilled PT intervention in order to decrease pain, improve core strength/stability, and return to functional activities without difficulty.  OBJECTIVE IMPAIRMENTS: decreased activity tolerance, decreased coordination, decreased endurance, decreased strength, increased fascial restrictions, increased muscle spasms, impaired tone, postural dysfunction, and pain.   ACTIVITY LIMITATIONS: lifting, bending, sitting, standing, squatting, and sleeping  PARTICIPATION LIMITATIONS: cleaning, interpersonal relationship, community activity, and occupation  PERSONAL FACTORS: 1 comorbidity: medical history are also affecting patient's functional outcome.   REHAB POTENTIAL: Good  CLINICAL DECISION MAKING: Stable/uncomplicated  EVALUATION COMPLEXITY: Low   GOALS: Goals reviewed with patient? Yes  SHORT TERM GOALS: Target date: 10/09/22- updated 10/09/22 - address goals   Pt will be independent with HEP.   Baseline: Goal status: MET 11/17/22  2.  Pt will be able to correctly perform diaphragmatic breathing and appropriate pressure management in order to prevent worsening vaginal wall laxity and improve pelvic floor A/ROM.   Baseline:  Goal status: MET 11/17/22  3.  Pt will be independent with diaphragmatic breathing and down training activities in order to improve pelvic floor relaxation.  Baseline:  Goal status: MET 11/17/22  4.  Pt will be independent with use of squatty potty,  relaxed toileting mechanics, and improved bowel movement techniques in order to increase ease of bowel movements and complete evacuation.   Baseline:  Goal status: MET 11/17/22   LONG TERM GOALS: Target date: 02/19/23 - updated 10/09/22 - address goals   Pt will be independent with advanced HEP.   Baseline:  Goal status: IN PROGRESS  2.  Pt will be able to perform all work duties without increased pelvic/low back pain.  Baseline:  Goal status: IN PROGRESS  3.  Pt will deny any abdominal cramping greater than 1/10.  Baseline:  Goal status: IN PROGRESS  4.  Pt will be able to have complete bowel movements without straining and use of appropriate mechanics.  Baseline:  Goal status: IN PROGRESS  5.  Pt will report 0/10 pain with vaginal penetration in order to improve intimate relationship with partner.    Baseline:  Goal status: IN PROGRESS  6.  Pt will increase all impaired lumbar A/ROM by 25% without pain.  Baseline:  Goal status: IN PROGRESS  PLAN:  PT FREQUENCY: 1-2x/week  PT DURATION: 6 months  PLANNED INTERVENTIONS: Therapeutic exercises, Therapeutic activity, Neuromuscular re-education, Balance training, Gait training, Patient/Family education, Self Care, Joint mobilization, Dry Needling, Biofeedback, and Manual therapy  PLAN FOR NEXT SESSION: Continue internal rectal release of pelvic floor and coccyx; progress quadruped activities to include fire hydrant  and donkey kicks; sidestepping.    Julio Alm, PT, DPT06/08/2410:31 PM

## 2022-11-20 ENCOUNTER — Other Ambulatory Visit (HOSPITAL_COMMUNITY): Payer: Self-pay

## 2022-11-20 ENCOUNTER — Other Ambulatory Visit: Payer: Self-pay

## 2022-11-20 ENCOUNTER — Encounter: Payer: Self-pay | Admitting: Family Medicine

## 2022-11-20 ENCOUNTER — Other Ambulatory Visit: Payer: Self-pay | Admitting: Adult Health

## 2022-11-20 MED ORDER — RIZATRIPTAN BENZOATE 10 MG PO TBDP
ORAL_TABLET | ORAL | 4 refills | Status: DC
  Filled 2022-11-20: qty 9, 30d supply, fill #0
  Filled 2022-12-25: qty 9, 30d supply, fill #1
  Filled 2023-01-22: qty 9, 30d supply, fill #2

## 2022-11-20 MED ORDER — AIMOVIG 140 MG/ML ~~LOC~~ SOAJ
1.0000 mg | SUBCUTANEOUS | 5 refills | Status: DC
  Filled 2022-11-20: qty 1, 30d supply, fill #0
  Filled 2022-12-15: qty 1, 30d supply, fill #1
  Filled 2023-01-16 – 2023-01-17 (×2): qty 1, 30d supply, fill #2
  Filled 2023-02-19: qty 1, 30d supply, fill #3

## 2022-11-24 ENCOUNTER — Ambulatory Visit: Payer: Commercial Managed Care - PPO

## 2022-11-24 DIAGNOSIS — R252 Cramp and spasm: Secondary | ICD-10-CM

## 2022-11-24 DIAGNOSIS — R279 Unspecified lack of coordination: Secondary | ICD-10-CM | POA: Diagnosis not present

## 2022-11-24 DIAGNOSIS — M6281 Muscle weakness (generalized): Secondary | ICD-10-CM | POA: Diagnosis not present

## 2022-11-24 DIAGNOSIS — R102 Pelvic and perineal pain: Secondary | ICD-10-CM | POA: Diagnosis not present

## 2022-11-24 DIAGNOSIS — M25552 Pain in left hip: Secondary | ICD-10-CM

## 2022-11-24 DIAGNOSIS — M62838 Other muscle spasm: Secondary | ICD-10-CM

## 2022-11-24 DIAGNOSIS — M5459 Other low back pain: Secondary | ICD-10-CM

## 2022-11-24 NOTE — Therapy (Signed)
OUTPATIENT PHYSICAL THERAPY TREATMENT NOTE   Patient Name: Abigail Frank MRN: [id number] DOB:November 14, 1985, 37 y.o., female Today's Date: 11/24/2022  PCP: Gaspar Garbe, MD REFERRING PROVIDER: Jerene Bears, MD   END OF SESSION:   PT End of Session - 11/24/22 1100     Visit Number 6    Date for PT Re-Evaluation 02/19/23    Authorization Type [employer]    PT Start Time 1100    PT Stop Time 1140    PT Time Calculation (min) 40 min    Activity Tolerance Patient tolerated treatment well    Behavior During Therapy [facility] for tasks assessed/performed              Past Medical History:  Diagnosis Date   Anxiety    Asthma    Bone spur    cervical L side; L side arm numbness intermittent    Depression    Diabetes in pregnancy    Diabetes type 2, controlled (HCC)    Elevated cholesterol with high triglycerides    Gestational diabetes    Hx of varicella    IBS (irritable bowel syndrome)    Kidney stones    followed by Dr. Kathrynn Running   Migraines    without aura   Ovarian cyst    Postpartum care following vaginal delivery (1/30) 07/15/2013   Preterm uterine contractions 06/10/2013   Sleep apnea    Vitamin D deficiency    Past Surgical History:  Procedure Laterality Date   MOUTH SURGERY     wisdom teeth extractions   Patient Active Problem List   Diagnosis Date Noted   BMI 37.0-37.9, adult 10/15/2022   Low serum cortisol level 10/01/2022   Polyphagia 10/01/2022   Other fatigue 09/17/2022   SOB (shortness of breath) on exertion 09/17/2022   Vitamin D deficiency 09/17/2022   Health care maintenance 09/17/2022   BMI 38.0-38.9,adult 09/17/2022   Striae 09/17/2022   Vitamin B 12 deficiency 09/17/2022   Hypertriglyceridemia 09/17/2022   Generalized obesity 07/01/2021   History of menorrhagia 07/01/2021   Fecal smearing 07/01/2021   Anxiety 07/01/2021   Diabetes type 2, controlled (HCC) 06/28/2021   Carpal tunnel syndrome of left wrist 12/26/2018    Chronic migraine without aura without status migrainosus, not intractable 10/09/2018    REFERRING DIAG: M62.89 (ICD-10-CM) - Pelvic floor dysfunction R10.2 (ICD-10-CM) - Pelvic pain  THERAPY DIAG:  Pelvic pain  Other low back pain  Pain in left hip  Muscle weakness (generalized)  Other muscle spasm  Unspecified lack of coordination  Cramp and spasm  Rationale for Evaluation and Treatment Rehabilitation  PERTINENT HISTORY: IBS, migraines, G2P2 with significant tearing  PRECAUTIONS: NA  SUBJECTIVE:  SUBJECTIVE STATEMENT: Pt states that she was very sore right after last treatment session, but then after several hours and is still feeling better.    PAIN:  Are you having pain? Yes NPRS scale: 4/10 Pain location: bil abdominal; low back pain, Lt hip pain  Pain type: sharp and cramping Pain description: constant   Aggravating factors: bending/twisting  Relieving factors: heat, ibuprofen    09/04/22 SUBJECTIVE STATEMENT: Pt states that she was in PT before due to fecal smearing and she has seen good progress with that. Now, her biggest complaint is lower abdominal cramping. She states that she has some vaginal tenderness. She feels like she is on her period/crampy all the time. She has Lt hip pain that does not get better. She has been having heavier menstrual cycles with lots of clotting. She is on progesterone only birth control.  Fluid intake: Yes: 64 oz a day, caffeine drinks   PAIN:  Are you having pain? Yes NPRS scale: 3/10 Pain location: bil abdominal; low back pain, Lt hip pain  Pain type: sharp and cramping Pain description: constant   Aggravating factors: uncertain Relieving factors: heat, ibuprofen   PRECAUTIONS: None  WEIGHT BEARING RESTRICTIONS: No  FALLS:  Has patient  fallen in last 6 months? No  LIVING ENVIRONMENT: Lives with: lives with their family Lives in: House/apartment   OCCUPATION: nurse at [employer]  PLOF: Independent  PATIENT GOALS: decrease pain  PERTINENT HISTORY:  IBS, migraines, G2P2 with significant tearing Sexual abuse: No  BOWEL MOVEMENT: Pain with bowel movement: No Type of bowel movement:Frequency at least 1x/day - sometimes multiple with ozempic and Strain No Fully empty rectum: No Leakage: No - very rare Pads: No Fiber supplement: No  URINATION: Pain with urination: No Fully empty bladder: Yes: - Stream: Has to bear down a little to start stream Urgency: No Frequency: every 2 hours Leakage: none Pads: No   INTERCOURSE: Pain with intercourse: During Penetration - can feel in Lt butt cheek Ability to have vaginal penetration:  Yes: but can be painful Climax: rarely and needs significant stimulation with vibrator   PREGNANCY: Vaginal deliveries 2 Tearing Yes: significant tear with 2nd 9 years ago C-section deliveries 0 Currently pregnant No  PROLAPSE: Cystocele - and Rectocele -   OBJECTIVE:  11/03/22 No emotional/communication barriers or cognitive limitation. Patient is motivated to learn. Patient understands and agrees with treatment goals and plan. PT explains patient will be examined in standing, sitting, and lying down to see how their muscles and joints work. When they are ready, they will be asked to remove their underwear so PT can examine their perineum. The patient is also given the option of providing their own chaperone as one is not provided in our facility. The patient also has the right and is explained the right to defer or refuse any part of the evaluation or treatment including the internal exam. With the patient's consent, PT will use one gloved finger to gently assess the muscles of the pelvic floor, seeing how well it contracts and relaxes and if there is muscle  symmetry. After, the patient will get dressed and PT and patient will discuss exam findings and plan of care. PT and patient discuss plan of care, schedule, attendance policy and HEP activities.                External Perineal Exam WNL  Internal Pelvic Floor Disocmfort and high  Patient confirms identification and approves PT to assess internal pelvic floor and treatment Yes - next treatment session   PELVIC MMT:   MMT eval  Vaginal 3/5  Internal Anal Sphincter   External Anal Sphincter   Puborectalis 2/5  Diastasis Recti 2 finger width separation with distortion upon increased abdominal pressure  (Blank rows = not tested)        TONE: High  09/04/22:  DIAGNOSTIC FINDINGS:  Abdominal US  COGNITION: Overall cognitive status: Within functional limits for tasks assessed     SENSATION: Light touch: Appears intact Proprioception: Appears intact  FUNCTIONAL TESTS:  -Squat: Lt weight shift, Rt rotation -Single leg stance: Rt 4 seconds, Lt 10 seconds  GAIT: Comments: WNL  POSTURE: rounded shoulders, forward head, decreased thoracic kyphosis, and posterior pelvic tilt, Rt shoulder and iliac crest elevation, mild Lt lower thoracic curvature   LUMBARAROM/PROM:  A/PROM A/PROM  Eval (% available)  Flexion 100, very painful  Extension 50, very painful  Right lateral flexion 50  Left lateral flexion 50, pain on left  Right rotation 50  Left rotation 50   (Blank rows = not tested)  LOWER EXTREMITY MMT:  MMT Right eval Left eval  Hip flexion 4 4  Hip extension 4+ 4+  Hip abduction 4+ 4+  Hip adduction 3+, pain at pubic symphysis 3+, pain at pubic symphysis  Hip internal rotation 4 4  Hip external rotation 4 4   PALPATION:   General  Tenderness throughout bil/central lower abdomen; mild tenderness at upper abdominal rib attachments; pubic symphysis discomfort; tenderness with palpation of low back, sacrum, and coccyx    TODAY'S  TREATMENT                                                                                                                              DATE:  11/24/22 Manual: Trigger Point Dry-Needling  Treatment instructions: Expect mild to moderate muscle soreness. S/S of pneumothorax if dry needled over a lung field, and to seek immediate medical attention should they occur. Patient verbalized understanding of these instructions and education.  Patient Consent Given: Yes Education handout provided: Previously provided Muscles treated: Lt lumbar paraspinals, QL, glutes Electrical stimulation performed: No Parameters: N/A Treatment response/outcome: twitch response/release Soft tissue mobilization to Lt low back/hip Exercises: Lateral child's pose 2 min bil Peanut ball roll outs 10x Lateral flexion over peanut ball 4 minutes bil Seated side stretch with rotation 4 min bil Lizard stretch 2 min bil   11/17/22 Manual: Pt provides verbal consent for internal vaginal/rectal pelvic floor exam. Internal rectal release of pelvic floor Coccyx traction/mobilization   11/03/22 Manual: Pt provides verbal consent for internal vaginal/rectal pelvic floor exam. Internal vaginal pelvic floor muscle assessment Vaginal pelvic floor muscle release Neuromuscular re-education: Diaphragmatic breathing/relaxation awareness Cat cow 2 x 10 Child's pose 10 breaths Happy baby 10 breaths  Therapeutic activities: Urge drill Self massage with tennis ball  PATIENT EDUCATION:  Education details: see above Person educated: Patient Education method: Explanation, Demonstration, Tactile cues, Verbal cues, and Handouts Education comprehension: verbalized understanding  HOME EXERCISE PROGRAM: [id number]  ASSESSMENT:  CLINICAL IMPRESSION: Pt did very well with internal rectal techniques last session with pain relief and continued relief to today. Due to exacerbation of low back, DN and manual techniques performed  with good release of tension. Focus of session then placed on mobility activities and myofascial release with exercise. She did well with all activities. She will continue to benefit from skilled PT intervention in order to decrease pain, improve core strength/stability, and return to functional activities without difficulty.  OBJECTIVE IMPAIRMENTS: decreased activity tolerance, decreased coordination, decreased endurance, decreased strength, increased fascial restrictions, increased muscle spasms, impaired tone, postural dysfunction, and pain.   ACTIVITY LIMITATIONS: lifting, bending, sitting, standing, squatting, and sleeping  PARTICIPATION LIMITATIONS: cleaning, interpersonal relationship, community activity, and occupation  PERSONAL FACTORS: 1 comorbidity: medical history are also affecting patient's functional outcome.   REHAB POTENTIAL: Good  CLINICAL DECISION MAKING: Stable/uncomplicated  EVALUATION COMPLEXITY: Low   GOALS: Goals reviewed with patient? Yes  SHORT TERM GOALS: Target date: 10/09/22- updated 10/09/22 - address goals   Pt will be independent with HEP.   Baseline: Goal status: MET 11/17/22  2.  Pt will be able to correctly perform diaphragmatic breathing and appropriate pressure management in order to prevent worsening vaginal wall laxity and improve pelvic floor A/ROM.   Baseline:  Goal status: MET 11/17/22  3.  Pt will be independent with diaphragmatic breathing and down training activities in order to improve pelvic floor relaxation.  Baseline:  Goal status: MET 11/17/22  4.  Pt will be independent with use of squatty potty, relaxed toileting mechanics, and improved bowel movement techniques in order to increase ease of bowel movements and complete evacuation.   Baseline:  Goal status: MET 11/17/22   LONG TERM GOALS: Target date: 02/19/23 - updated 10/09/22 - address goals   Pt will be independent with advanced HEP.   Baseline:  Goal status: IN PROGRESS  2.   Pt will be able to perform all work duties without increased pelvic/low back pain.  Baseline:  Goal status: IN PROGRESS  3.  Pt will deny any abdominal cramping greater than 1/10.  Baseline:  Goal status: IN PROGRESS  4.  Pt will be able to have complete bowel movements without straining and use of appropriate mechanics.  Baseline:  Goal status: IN PROGRESS  5.  Pt will report 0/10 pain with vaginal penetration in order to improve intimate relationship with partner.    Baseline:  Goal status: IN PROGRESS  6.  Pt will increase all impaired lumbar A/ROM by 25% without pain.  Baseline:  Goal status: IN PROGRESS  PLAN:  PT FREQUENCY: 1-2x/week  PT DURATION: 6 months  PLANNED INTERVENTIONS: Therapeutic exercises, Therapeutic activity, Neuromuscular re-education, Balance training, Gait training, Patient/Family education, Self Care, Joint mobilization, Dry Needling, Biofeedback, and Manual therapy  PLAN FOR NEXT SESSION: Continue internal rectal release of pelvic floor and coccyx; progress quadruped activities to include fire hydrant and donkey kicks; sidestepping.    Julio Alm, PT, DPT06/03/2410:41 AM

## 2022-11-27 DIAGNOSIS — G4733 Obstructive sleep apnea (adult) (pediatric): Secondary | ICD-10-CM | POA: Diagnosis not present

## 2022-11-28 DIAGNOSIS — G4733 Obstructive sleep apnea (adult) (pediatric): Secondary | ICD-10-CM | POA: Diagnosis not present

## 2022-12-01 ENCOUNTER — Ambulatory Visit: Payer: Commercial Managed Care - PPO

## 2022-12-01 DIAGNOSIS — M6281 Muscle weakness (generalized): Secondary | ICD-10-CM

## 2022-12-01 DIAGNOSIS — M25552 Pain in left hip: Secondary | ICD-10-CM

## 2022-12-01 DIAGNOSIS — M62838 Other muscle spasm: Secondary | ICD-10-CM

## 2022-12-01 DIAGNOSIS — M5459 Other low back pain: Secondary | ICD-10-CM | POA: Diagnosis not present

## 2022-12-01 DIAGNOSIS — R102 Pelvic and perineal pain: Secondary | ICD-10-CM

## 2022-12-01 DIAGNOSIS — R279 Unspecified lack of coordination: Secondary | ICD-10-CM | POA: Diagnosis not present

## 2022-12-01 DIAGNOSIS — R252 Cramp and spasm: Secondary | ICD-10-CM

## 2022-12-01 NOTE — Therapy (Signed)
OUTPATIENT PHYSICAL THERAPY TREATMENT NOTE   Patient Name: Abigail Frank MRN: [id number] DOB:1985-12-21, 37 y.o., female Today's Date: 12/01/2022  PCP: Gaspar Garbe, MD REFERRING PROVIDER: Jerene Bears, MD   END OF SESSION:   PT End of Session - 12/01/22 1104     Visit Number 7    Date for PT Re-Evaluation 02/19/23    Authorization Type [employer]    PT Start Time 1101    PT Stop Time 1140    PT Time Calculation (min) 39 min    Activity Tolerance Patient tolerated treatment well    Behavior During Therapy WFL for tasks assessed/performed               Past Medical History:  Diagnosis Date   Anxiety    Asthma    Bone spur    cervical L side; L side arm numbness intermittent    Depression    Diabetes in pregnancy    Diabetes type 2, controlled (HCC)    Elevated cholesterol with high triglycerides    Gestational diabetes    Hx of varicella    IBS (irritable bowel syndrome)    Kidney stones    followed by Dr. Kathrynn Running   Migraines    without aura   Ovarian cyst    Postpartum care following vaginal delivery (1/30) 07/15/2013   Preterm uterine contractions 06/10/2013   Sleep apnea    Vitamin D deficiency    Past Surgical History:  Procedure Laterality Date   MOUTH SURGERY     wisdom teeth extractions   Patient Active Problem List   Diagnosis Date Noted   BMI 37.0-37.9, adult 10/15/2022   Low serum cortisol level 10/01/2022   Polyphagia 10/01/2022   Other fatigue 09/17/2022   SOB (shortness of breath) on exertion 09/17/2022   Vitamin D deficiency 09/17/2022   Health care maintenance 09/17/2022   BMI 38.0-38.9,adult 09/17/2022   Striae 09/17/2022   Vitamin B 12 deficiency 09/17/2022   Hypertriglyceridemia 09/17/2022   Generalized obesity 07/01/2021   History of menorrhagia 07/01/2021   Fecal smearing 07/01/2021   Anxiety 07/01/2021   Diabetes type 2, controlled (HCC) 06/28/2021   Carpal tunnel syndrome of left wrist 12/26/2018    Chronic migraine without aura without status migrainosus, not intractable 10/09/2018    REFERRING DIAG: M62.89 (ICD-10-CM) - Pelvic floor dysfunction R10.2 (ICD-10-CM) - Pelvic pain  THERAPY DIAG:  Other low back pain  Pain in left hip  Pelvic pain  Other muscle spasm  Muscle weakness (generalized)  Unspecified lack of coordination  Cramp and spasm  Rationale for Evaluation and Treatment Rehabilitation  PERTINENT HISTORY: IBS, migraines, G2P2 with significant tearing  PRECAUTIONS: NA  SUBJECTIVE:  SUBJECTIVE STATEMENT: Pt states that she did very well after last treatment session with reduced pain. However, she still has one pinpoint area of pain in her back and along tailbone.   PAIN:  Are you having pain? Yes NPRS scale: 1/10 Pain location: bil abdominal; low back pain, Lt hip pain  Pain type: sharp and cramping Pain description: constant   Aggravating factors: bending/twisting  Relieving factors: heat, ibuprofen    09/04/22 SUBJECTIVE STATEMENT: Pt states that she was in PT before due to fecal smearing and she has seen good progress with that. Now, her biggest complaint is lower abdominal cramping. She states that she has some vaginal tenderness. She feels like she is on her period/crampy all the time. She has Lt hip pain that does not get better. She has been having heavier menstrual cycles with lots of clotting. She is on progesterone only birth control.  Fluid intake: Yes: 64 oz a day, caffeine drinks   PAIN:  Are you having pain? Yes NPRS scale: 3/10 Pain location: bil abdominal; low back pain, Lt hip pain  Pain type: sharp and cramping Pain description: constant   Aggravating factors: uncertain Relieving factors: heat, ibuprofen   PRECAUTIONS: None  WEIGHT BEARING  RESTRICTIONS: No  FALLS:  Has patient fallen in last 6 months? No  LIVING ENVIRONMENT: Lives with: lives with their family Lives in: House/apartment   OCCUPATION: nurse at [employer]  PLOF: Independent  PATIENT GOALS: decrease pain  PERTINENT HISTORY:  IBS, migraines, G2P2 with significant tearing Sexual abuse: No  BOWEL MOVEMENT: Pain with bowel movement: No Type of bowel movement:Frequency at least 1x/day - sometimes multiple with ozempic and Strain No Fully empty rectum: No Leakage: No - very rare Pads: No Fiber supplement: No  URINATION: Pain with urination: No Fully empty bladder: Yes: - Stream: Has to bear down a little to start stream Urgency: No Frequency: every 2 hours Leakage: none Pads: No   INTERCOURSE: Pain with intercourse: During Penetration - can feel in Lt butt cheek Ability to have vaginal penetration:  Yes: but can be painful Climax: rarely and needs significant stimulation with vibrator   PREGNANCY: Vaginal deliveries 2 Tearing Yes: significant tear with 2nd 9 years ago C-section deliveries 0 Currently pregnant No  PROLAPSE: Cystocele - and Rectocele -   OBJECTIVE:  11/03/22 No emotional/communication barriers or cognitive limitation. Patient is motivated to learn. Patient understands and agrees with treatment goals and plan. PT explains patient will be examined in standing, sitting, and lying down to see how their muscles and joints work. When they are ready, they will be asked to remove their underwear so PT can examine their perineum. The patient is also given the option of providing their own chaperone as one is not provided in our facility. The patient also has the right and is explained the right to defer or refuse any part of the evaluation or treatment including the internal exam. With the patient's consent, PT will use one gloved finger to gently assess the muscles of the pelvic floor, seeing how well it contracts  and relaxes and if there is muscle symmetry. After, the patient will get dressed and PT and patient will discuss exam findings and plan of care. PT and patient discuss plan of care, schedule, attendance policy and HEP activities.                External Perineal Exam WNL  Internal Pelvic Floor Disocmfort and high  Patient confirms identification and approves PT to assess internal pelvic floor and treatment Yes - next treatment session   PELVIC MMT:   MMT eval  Vaginal 3/5  Internal Anal Sphincter   External Anal Sphincter   Puborectalis 2/5  Diastasis Recti 2 finger width separation with distortion upon increased abdominal pressure  (Blank rows = not tested)        TONE: High  09/04/22:  DIAGNOSTIC FINDINGS:  Abdominal US  COGNITION: Overall cognitive status: Within functional limits for tasks assessed     SENSATION: Light touch: Appears intact Proprioception: Appears intact  FUNCTIONAL TESTS:  -Squat: Lt weight shift, Rt rotation -Single leg stance: Rt 4 seconds, Lt 10 seconds  GAIT: Comments: WNL  POSTURE: rounded shoulders, forward head, decreased thoracic kyphosis, and posterior pelvic tilt, Rt shoulder and iliac crest elevation, mild Lt lower thoracic curvature   LUMBARAROM/PROM:  A/PROM A/PROM  Eval (% available)  Flexion 100, very painful  Extension 50, very painful  Right lateral flexion 50  Left lateral flexion 50, pain on left  Right rotation 50  Left rotation 50   (Blank rows = not tested)  LOWER EXTREMITY MMT:  MMT Right eval Left eval  Hip flexion 4 4  Hip extension 4+ 4+  Hip abduction 4+ 4+  Hip adduction 3+, pain at pubic symphysis 3+, pain at pubic symphysis  Hip internal rotation 4 4  Hip external rotation 4 4   PALPATION:   General  Tenderness throughout bil/central lower abdomen; mild tenderness at upper abdominal rib attachments; pubic symphysis discomfort; tenderness with palpation of low back,  sacrum, and coccyx    TODAY'S TREATMENT                                                                                                                              DATE:  12/01/22 Manual: Trigger Point Dry-Needling  Treatment instructions: Expect mild to moderate muscle soreness. S/S of pneumothorax if dry needled over a lung field, and to seek immediate medical attention should they occur. Patient verbalized understanding of these instructions and education.  Patient Consent Given: Yes Education handout provided: Previously provided Muscles treated: Lt lumbar paraspinals, QL, glutes Electrical stimulation performed: No Parameters: N/A Treatment response/outcome: twitch response/release Soft tissue mobilization to Lt low back/hip Pt provides verbal consent for internal vaginal/rectal pelvic floor exam. Internal rectal release of pelvic floor Coccyx traction/mobilization   11/24/22 Manual: Trigger Point Dry-Needling  Treatment instructions: Expect mild to moderate muscle soreness. S/S of pneumothorax if dry needled over a lung field, and to seek immediate medical attention should they occur. Patient verbalized understanding of these instructions and education.  Patient Consent Given: Yes Education handout provided: Previously provided Muscles treated: Lt lumbar paraspinals, QL, glutes Electrical stimulation performed: No Parameters: N/A Treatment response/outcome: twitch response/release Soft tissue mobilization to Lt low back/hip  Exercises: Lateral child's pose 2 min bil Peanut ball roll outs 10x  Lateral flexion over peanut ball 4 minutes bil Seated side stretch with rotation 4 min bil Lizard stretch 2 min bil   11/17/22 Manual: Pt provides verbal consent for internal vaginal/rectal pelvic floor exam. Internal rectal release of pelvic floor Coccyx traction/mobilization   PATIENT EDUCATION:  Education details: see above Person educated: Patient Education method:  Explanation, Demonstration, Tactile cues, Verbal cues, and Handouts Education comprehension: verbalized understanding  HOME EXERCISE PROGRAM: [id number]  ASSESSMENT:  CLINICAL IMPRESSION: Pt did well with all manual techniques to address pain and soft tissue restriction. Significant trigger point in Lt lumbar paraspinals and along LT coccygeal border; good reduction with manual techniques. We discussed some of the exercises she has been performing with daughter and that thye are probably having negative impact on pain and restriction in pelvic floor due to increase in abdominal pressure. She will continue to benefit from skilled PT intervention in order to decrease pain, improve core strength/stability, and return to functional activities without difficulty.  OBJECTIVE IMPAIRMENTS: decreased activity tolerance, decreased coordination, decreased endurance, decreased strength, increased fascial restrictions, increased muscle spasms, impaired tone, postural dysfunction, and pain.   ACTIVITY LIMITATIONS: lifting, bending, sitting, standing, squatting, and sleeping  PARTICIPATION LIMITATIONS: cleaning, interpersonal relationship, community activity, and occupation  PERSONAL FACTORS: 1 comorbidity: medical history are also affecting patient's functional outcome.   REHAB POTENTIAL: Good  CLINICAL DECISION MAKING: Stable/uncomplicated  EVALUATION COMPLEXITY: Low   GOALS: Goals reviewed with patient? Yes  SHORT TERM GOALS: Target date: 10/09/22- updated 10/09/22 - - updated 11/17/22  Pt will be independent with HEP.   Baseline: Goal status: MET 11/17/22  2.  Pt will be able to correctly perform diaphragmatic breathing and appropriate pressure management in order to prevent worsening vaginal wall laxity and improve pelvic floor A/ROM.   Baseline:  Goal status: MET 11/17/22  3.  Pt will be independent with diaphragmatic breathing and down training activities in order to improve pelvic floor  relaxation.  Baseline:  Goal status: MET 11/17/22  4.  Pt will be independent with use of squatty potty, relaxed toileting mechanics, and improved bowel movement techniques in order to increase ease of bowel movements and complete evacuation.   Baseline:  Goal status: MET 11/17/22   LONG TERM GOALS: Target date: 02/19/23 - updated 10/09/22 - updated 11/17/22 Pt will be independent with advanced HEP.   Baseline:  Goal status: IN PROGRESS  2.  Pt will be able to perform all work duties without increased pelvic/low back pain.  Baseline:  Goal status: IN PROGRESS  3.  Pt will deny any abdominal cramping greater than 1/10.  Baseline:  Goal status: IN PROGRESS  4.  Pt will be able to have complete bowel movements without straining and use of appropriate mechanics.  Baseline:  Goal status: IN PROGRESS  5.  Pt will report 0/10 pain with vaginal penetration in order to improve intimate relationship with partner.    Baseline:  Goal status: IN PROGRESS  6.  Pt will increase all impaired lumbar A/ROM by 25% without pain.  Baseline:  Goal status: IN PROGRESS  PLAN:  PT FREQUENCY: 1-2x/week  PT DURATION: 6 months  PLANNED INTERVENTIONS: Therapeutic exercises, Therapeutic activity, Neuromuscular re-education, Balance training, Gait training, Patient/Family education, Self Care, Joint mobilization, Dry Needling, Biofeedback, and Manual therapy  PLAN FOR NEXT SESSION: Continue internal rectal release of pelvic floor and coccyx; progress quadruped activities to include fire hydrant and donkey kicks; sidestepping.    Julio Alm, PT, DPT06/17/2411:44 AM

## 2022-12-03 ENCOUNTER — Encounter: Payer: Self-pay | Admitting: Nurse Practitioner

## 2022-12-03 ENCOUNTER — Ambulatory Visit: Payer: Commercial Managed Care - PPO | Admitting: Nurse Practitioner

## 2022-12-03 VITALS — BP 110/74 | HR 77 | Temp 98.3°F | Ht 62.0 in | Wt 202.0 lb

## 2022-12-03 DIAGNOSIS — R1013 Epigastric pain: Secondary | ICD-10-CM | POA: Diagnosis not present

## 2022-12-03 DIAGNOSIS — Z6836 Body mass index (BMI) 36.0-36.9, adult: Secondary | ICD-10-CM | POA: Diagnosis not present

## 2022-12-03 DIAGNOSIS — E669 Obesity, unspecified: Secondary | ICD-10-CM | POA: Diagnosis not present

## 2022-12-03 DIAGNOSIS — Z7985 Long-term (current) use of injectable non-insulin antidiabetic drugs: Secondary | ICD-10-CM | POA: Diagnosis not present

## 2022-12-03 DIAGNOSIS — E119 Type 2 diabetes mellitus without complications: Secondary | ICD-10-CM | POA: Diagnosis not present

## 2022-12-03 NOTE — Progress Notes (Signed)
Office: [phone]  /  Fax: [phone]  WEIGHT SUMMARY AND BIOMETRICS  Weight Lost Since Last Visit: 4lb  No data recorded  Vitals Temp: 98.3 F (36.8 C) BP: 110/74 Pulse Rate: 77 SpO2: 97 %   Anthropometric Measurements Height: 5\' 2"  (1.575 m) Weight: 202 lb (91.6 kg) BMI (Calculated): 36.94 Weight at Last Visit: 206lb Weight Lost Since Last Visit: 4lb Starting Weight: 208lb Total Weight Loss (lbs): 6 lb (2.722 kg)   Body Composition  Body Fat %: 43.7 % Fat Mass (lbs): 88.4 lbs Muscle Mass (lbs): 108 lbs Total Body Water (lbs): 77.8 lbs Visceral Fat Rating : 10   Other Clinical Data Fasting: No Labs: No Today's Visit #: 6 Starting Date: 09/17/22     HPI  Chief Complaint: OBESITY  Abigail Frank is here to discuss her progress with her obesity treatment plan. She is on the the Category 2 Plan and states she is following her eating plan approximately 95 % of the time. She states she is exercising 20-30 minutes 3 days per week.   Interval History:  Since last office visit she lost 4 pounds. She is struggling with upper abd pain for the past 4 days.  She is worried that she has an ulcer as she has been taking a lot of Motrin. Feels better with eating. The thought of wanting to eat "makes me feel yucky".  She has had an ulcer in the past.  She is taking Prilosec and Pepcid daily over the past 4 days.  Reports today she is feeling a little better.  She is also "eating tums like candy".  Denies vomiting.  Just really doesn't want to eat and feels "yucky".     Pharmacotherapy for weight loss: She is not currently taking medications  for medical weight loss.    Previous pharmacotherapy for medical weight loss:  None  Bariatric surgery:  Has not had bariatric surgery.    Pharmacotherapy for DMT2:  She is currently taking Ozempic 1mg .  Reports side effects of constipation.   Last A1c was 6.2 She is not checking BS at home.   Episodes of hypoglycemia: no Is not  on ACE or ARB, ASA 81mg  and statin.  Last eye exam:  2023 She has tried Glyburide in the past.    Lab Results  Component Value Date   HGBA1C 6.2 (H) 10/31/2019   Lab Results  Component Value Date   LDLCALC 104 (H) 09/17/2022   CREATININE 0.69 10/31/2019      PHYSICAL EXAM:  Blood pressure 110/74, pulse 77, temperature 98.3 F (36.8 C), height 5\' 2"  (1.575 m), weight 202 lb (91.6 kg), last menstrual period 11/21/2022, SpO2 97 %. Body mass index is 36.95 kg/m.  General: She is overweight, cooperative, alert, well developed, and in no acute distress. PSYCH: Has normal mood, affect and thought process.   Extremities: No edema.  Neurologic: No gross sensory or motor deficits. No tremors or fasciculations noted.    DIAGNOSTIC DATA REVIEWED:  BMET    Component Value Date/Time   NA 141 10/31/2019 1518   K 4.5 10/31/2019 1518   CL 102 10/31/2019 1518   CO2 25 10/31/2019 1518   GLUCOSE 76 10/31/2019 1518   GLUCOSE 151 (H) 06/16/2017 2322   BUN 11 10/31/2019 1518   CREATININE 0.69 10/31/2019 1518   CALCIUM 9.2 10/31/2019 1518   GFRNONAA 115 10/31/2019 1518   GFRAA 132 10/31/2019 1518   Lab Results  Component Value Date   HGBA1C 6.2 (H) 10/31/2019  Lab Results  Component Value Date   INSULIN 21.5 09/17/2022   Lab Results  Component Value Date   TSH 2.280 09/17/2022   CBC    Component Value Date/Time   WBC 8.2 10/31/2019 1518   WBC 8.5 07/14/2017 1209   RBC 4.84 10/31/2019 1518   RBC 5.12 (H) 07/14/2017 1209   HGB 13.0 10/31/2019 1518   HCT 41.4 10/31/2019 1518   PLT 281 10/31/2019 1518   MCV 86 10/31/2019 1518   MCH 26.9 10/31/2019 1518   MCH 27.9 06/16/2017 2322   MCHC 31.4 (L) 10/31/2019 1518   MCHC 33.6 07/14/2017 1209   RDW 13.2 10/31/2019 1518   Iron Studies    Component Value Date/Time   IRON 40 10/31/2019 1518   TIBC 333 10/31/2019 1518   FERRITIN 60 10/31/2019 1518   IRONPCTSAT 12 (L) 10/31/2019 1518   Lipid Panel     Component Value  Date/Time   CHOL 181 09/17/2022 1042   TRIG 213 (H) 09/17/2022 1042   HDL 40 09/17/2022 1042   LDLCALC 104 (H) 09/17/2022 1042   Hepatic Function Panel     Component Value Date/Time   PROT 6.8 10/31/2019 1518   ALBUMIN 4.5 10/31/2019 1518   AST 21 10/31/2019 1518   ALT 24 10/31/2019 1518   ALKPHOS 104 10/31/2019 1518   BILITOT <0.2 10/31/2019 1518      Component Value Date/Time   TSH 2.280 09/17/2022 1042   Nutritional No results found for: "VD25OH"   ASSESSMENT AND PLAN  TREATMENT PLAN FOR OBESITY:  Recommended Dietary Goals  Abigail Frank is currently in the action stage of change. As such, her goal is to continue weight management plan. She has agreed to the Category 2 Plan.  Behavioral Intervention  We discussed the following Behavioral Modification Strategies today: increasing lean protein intake, decreasing simple carbohydrates , increasing vegetables, increasing lower glycemic fruits, increasing fiber rich foods, avoiding skipping meals, increasing water intake, reading food labels , keeping healthy foods at home, continue to practice mindfulness when eating, and planning for success.  Additional resources provided today: NA  Recommended Physical Activity Goals  Abigail Frank has been advised to work up to 150 minutes of moderate intensity aerobic activity a week and strengthening exercises 2-3 times per week for cardiovascular health, weight loss maintenance and preservation of muscle mass.   She has agreed to Continue current level of physical activity  and Think about ways to increase daily physical activity and overcoming barriers to exercise   ASSOCIATED CONDITIONS ADDRESSED TODAY  Action/Plan  Controlled type 2 diabetes mellitus without complication, without long-term current use of insulin (HCC) Continue Ozempic but decrease to 0.5mg .  Patient to contact me to let me know how she is doing tomorrow.    Epigastric pain -     Ambulatory referral to  Gastroenterology (urgent referral placed).  To ER with worsening of symptoms.    Generalized obesity  BMI 36.0-36.9,adult         Return in about 3 weeks (around 12/24/2022).Marland Kitchen She was informed of the importance of frequent follow up visits to maximize her success with intensive lifestyle modifications for her multiple health conditions.   ATTESTASTION STATEMENTS:  Reviewed by clinician on day of visit: allergies, medications, problem list, medical history, surgical history, family history, social history, and previous encounter notes.     Theodis Sato. Kahlan Engebretson FNP-C

## 2022-12-04 ENCOUNTER — Telehealth: Payer: Self-pay

## 2022-12-04 ENCOUNTER — Encounter: Payer: Self-pay | Admitting: Gastroenterology

## 2022-12-04 ENCOUNTER — Encounter: Payer: Self-pay | Admitting: Nurse Practitioner

## 2022-12-04 LAB — CBC WITH DIFFERENTIAL/PLATELET
Basophils Absolute: 0 10*3/uL (ref 0.0–0.2)
Basos: 0 %
EOS (ABSOLUTE): 0.1 10*3/uL (ref 0.0–0.4)
Eos: 1 %
Hematocrit: 42.3 % (ref 34.0–46.6)
Hemoglobin: 13.5 g/dL (ref 11.1–15.9)
Immature Grans (Abs): 0.1 10*3/uL (ref 0.0–0.1)
Immature Granulocytes: 1 %
Lymphocytes Absolute: 3 10*3/uL (ref 0.7–3.1)
Lymphs: 28 %
MCH: 26.8 pg (ref 26.6–33.0)
MCHC: 31.9 g/dL (ref 31.5–35.7)
MCV: 84 fL (ref 79–97)
Monocytes Absolute: 0.7 10*3/uL (ref 0.1–0.9)
Monocytes: 6 %
Neutrophils Absolute: 7 10*3/uL (ref 1.4–7.0)
Neutrophils: 64 %
Platelets: 325 10*3/uL (ref 150–450)
RBC: 5.03 x10E6/uL (ref 3.77–5.28)
RDW: 13.2 % (ref 11.7–15.4)
WBC: 10.9 10*3/uL — ABNORMAL HIGH (ref 3.4–10.8)

## 2022-12-04 LAB — LIPASE: Lipase: 71 U/L (ref 14–72)

## 2022-12-04 LAB — COMPREHENSIVE METABOLIC PANEL
ALT: 15 IU/L (ref 0–32)
AST: 15 IU/L (ref 0–40)
Albumin: 4.6 g/dL (ref 3.9–4.9)
Alkaline Phosphatase: 90 IU/L (ref 44–121)
BUN/Creatinine Ratio: 22 (ref 9–23)
BUN: 16 mg/dL (ref 6–20)
Bilirubin Total: <0.2 mg/dL (ref 0.0–1.2)
CO2: 24 mmol/L (ref 20–29)
Calcium: 10.2 mg/dL (ref 8.7–10.2)
Chloride: 99 mmol/L (ref 96–106)
Creatinine, Ser: 0.74 mg/dL (ref 0.57–1.00)
Globulin, Total: 2.5 g/dL (ref 1.5–4.5)
Glucose: 90 mg/dL (ref 70–99)
Potassium: 4.2 mmol/L (ref 3.5–5.2)
Sodium: 139 mmol/L (ref 134–144)
Total Protein: 7.1 g/dL (ref 6.0–8.5)
eGFR: 107 mL/min/{1.73_m2} (ref >59)

## 2022-12-04 LAB — AMYLASE: Amylase: 29 U/L — ABNORMAL LOW (ref 31–110)

## 2022-12-04 NOTE — Telephone Encounter (Signed)
Patient is returning staff call. Please call patient back

## 2022-12-04 NOTE — Telephone Encounter (Signed)
Returned patients call, left detailed message that Analeigh wants patient to go to urgent care due to elevated wbc count and patient having stomach pains, possible need for CT.

## 2022-12-04 NOTE — Telephone Encounter (Signed)
Attempted to call patient. Per Judeth Cornfield patient has an elevated WBC count and should go to urgent care. Left message for patient to return call.

## 2022-12-05 DIAGNOSIS — M4302 Spondylolysis, cervical region: Secondary | ICD-10-CM | POA: Diagnosis not present

## 2022-12-05 DIAGNOSIS — E119 Type 2 diabetes mellitus without complications: Secondary | ICD-10-CM | POA: Diagnosis not present

## 2022-12-05 DIAGNOSIS — R1013 Epigastric pain: Secondary | ICD-10-CM | POA: Diagnosis not present

## 2022-12-05 DIAGNOSIS — E669 Obesity, unspecified: Secondary | ICD-10-CM | POA: Diagnosis not present

## 2022-12-08 ENCOUNTER — Ambulatory Visit: Payer: Commercial Managed Care - PPO

## 2022-12-15 ENCOUNTER — Other Ambulatory Visit (HOSPITAL_COMMUNITY): Payer: Self-pay

## 2022-12-16 ENCOUNTER — Other Ambulatory Visit (HOSPITAL_COMMUNITY): Payer: Self-pay

## 2022-12-23 ENCOUNTER — Other Ambulatory Visit: Payer: Self-pay

## 2022-12-23 ENCOUNTER — Encounter: Payer: Self-pay | Admitting: Bariatrics

## 2022-12-23 ENCOUNTER — Ambulatory Visit: Payer: Commercial Managed Care - PPO | Admitting: Bariatrics

## 2022-12-23 VITALS — BP 105/69 | HR 80 | Temp 97.6°F | Ht 62.0 in | Wt 202.0 lb

## 2022-12-23 DIAGNOSIS — Z7985 Long-term (current) use of injectable non-insulin antidiabetic drugs: Secondary | ICD-10-CM

## 2022-12-23 DIAGNOSIS — E669 Obesity, unspecified: Secondary | ICD-10-CM

## 2022-12-23 DIAGNOSIS — Z6836 Body mass index (BMI) 36.0-36.9, adult: Secondary | ICD-10-CM | POA: Diagnosis not present

## 2022-12-23 DIAGNOSIS — E781 Pure hyperglyceridemia: Secondary | ICD-10-CM | POA: Diagnosis not present

## 2022-12-23 DIAGNOSIS — E119 Type 2 diabetes mellitus without complications: Secondary | ICD-10-CM | POA: Diagnosis not present

## 2022-12-23 MED ORDER — SEMAGLUTIDE (1 MG/DOSE) 4 MG/3ML ~~LOC~~ SOPN
1.0000 mg | PEN_INJECTOR | SUBCUTANEOUS | 0 refills | Status: DC
Start: 2022-12-23 — End: 2023-01-13
  Filled 2022-12-23: qty 3, 28d supply, fill #0

## 2022-12-23 NOTE — Progress Notes (Signed)
WEIGHT SUMMARY AND BIOMETRICS  Weight Lost Since Last Visit: 0  Weight Gained Since Last Visit: 0   Vitals Temp: 97.6 F (36.4 C) BP: 105/69 Pulse Rate: 80 SpO2: 98 %   Anthropometric Measurements Height: 5\' 2"  (1.575 m) Weight: 202 lb (91.6 kg) BMI (Calculated): 36.94 Weight at Last Visit: 202lb Weight Lost Since Last Visit: 0 Weight Gained Since Last Visit: 0 Starting Weight: 208lb Total Weight Loss (lbs): 6 lb (2.722 kg)   Body Composition  Body Fat %: 43.6 % Fat Mass (lbs): 88.4 lbs Muscle Mass (lbs): 108.4 lbs Total Body Water (lbs): 78.4 lbs Visceral Fat Rating : 10   Other Clinical Data Fasting: no Labs: no Today's Visit #: 7 Starting Date: 09/17/22    OBESITY Preslei is here to discuss her progress with her obesity treatment plan along with follow-up of her obesity related diagnoses.     Nutrition Plan: the Category 2 plan - 0% adherence.  Current exercise: walking and weightlifting  Interim History:  Her weight remains the same. She had stopped the Ozempic due to stomach pain, but has now resumed. She had been on NSAIDS and has now stopped and her pain is gone.  Eating all of the food on the plan., Meeting protein goals., and Water intake is adequate.  Pharmacotherapy: Lenda is on Ozempic 1 mg SQ weekly See above. Has resumed her Ozempic without issues.  Adverse side effects: None Hunger is moderately controlled.  Cravings are moderately controlled.  Assessment/Plan:   1. Controlled type 2 diabetes mellitus without complication, without long-term current use of insulin (HCC)  Type II Diabetes HgbA1c is at goal. Last A1c was 6.9 CBGs: Not checking      Episodes of hypoglycemia: no Medication(s): Ozempic 1 mg SQ weekly  Lab Results  Component Value Date   HGBA1C 6.2 (H) 10/31/2019   Lab Results  Component Value Date   LDLCALC 104 (H) 09/17/2022   CREATININE 0.74 12/03/2022    Plan: Continue and increase dose  Ozempic 1 mg SQ weekly Continue all other medications.  Will keep all carbohydrates low both sweets and starches.  Will continue exercise regimen to 30 to 60 minutes on most days of the week.  Aim for 7 to 9 hours of sleep nightly.  Eat more low glycemic index foods.   Hypertriglyceridemia:  Medication(s): Fish oil OTC. Denies side effects.   Cardiovascular risk factors: diabetes mellitus, dyslipidemia, and obesity (BMI >= 30 kg/m2)  Lab Results  Component Value Date   CHOL 181 09/17/2022   HDL 40 09/17/2022   LDLCALC 104 (H) 09/17/2022   TRIG 213 (H) 09/17/2022   Lab Results  Component Value Date   ALT 15 12/03/2022   AST 15 12/03/2022   ALKPHOS 90 12/03/2022   BILITOT <0.2 12/03/2022   The ASCVD Risk score (Arnett DK, et al., 2019) failed to calculate for the following reasons:   The 2019 ASCVD risk score is only valid for ages 74 to 57    Generalized Obesity: Current BMI BMI (Calculated): 36.94   Pharmacotherapy Plan Continue and refill  Ozempic 1 mg SQ weekly  Yudit is currently in the action stage of change. As such, her goal is to continue with weight loss efforts.  She has agreed to the Category 2 plan.  Exercise goals: For substantial health benefits, adults should do at least 150 minutes (2 hours and 30 minutes) a week of moderate-intensity, or 75 minutes (1 hour and 15 minutes) a week of vigorous-intensity  aerobic physical activity, or an equivalent combination of moderate- and vigorous-intensity aerobic activity. Aerobic activity should be performed in episodes of at least 10 minutes, and preferably, it should be spread throughout the week.  Behavioral modification strategies: increasing lean protein intake, decreasing simple carbohydrates , no meal skipping, meal planning , increasing fiber rich foods, and avoiding temptations.  Nazly has agreed to follow-up with our clinic in 3 weeks.      Objective:   VITALS: Per patient if applicable, see  vitals. GENERAL: Alert and in no acute distress. CARDIOPULMONARY: No increased WOB. Speaking in clear sentences.  PSYCH: Pleasant and cooperative. Speech normal rate and rhythm. Affect is appropriate. Insight and judgement are appropriate. Attention is focused, linear, and appropriate.  NEURO: Oriented as arrived to appointment on time with no prompting.   Attestation Statements:    This was prepared with the assistance of Engineer, civil (consulting).  Occasional wrong-word or sound-a-like substitutions may have occurred due to the inherent limitations of voice recognition software.   Corinna Capra, DO

## 2022-12-25 ENCOUNTER — Other Ambulatory Visit (HOSPITAL_COMMUNITY): Payer: Self-pay

## 2022-12-25 ENCOUNTER — Other Ambulatory Visit: Payer: Self-pay | Admitting: Adult Health

## 2022-12-25 ENCOUNTER — Other Ambulatory Visit (HOSPITAL_BASED_OUTPATIENT_CLINIC_OR_DEPARTMENT_OTHER): Payer: Self-pay | Admitting: Obstetrics & Gynecology

## 2022-12-25 ENCOUNTER — Other Ambulatory Visit: Payer: Self-pay

## 2022-12-25 DIAGNOSIS — F419 Anxiety disorder, unspecified: Secondary | ICD-10-CM

## 2022-12-25 MED ORDER — GABAPENTIN 300 MG PO CAPS
300.0000 mg | ORAL_CAPSULE | Freq: Two times a day (BID) | ORAL | 0 refills | Status: DC
  Filled 2022-12-25: qty 180, 90d supply, fill #0

## 2022-12-25 MED ORDER — BACLOFEN 10 MG PO TABS
10.0000 mg | ORAL_TABLET | Freq: Every day | ORAL | 5 refills | Status: DC | PRN
  Filled 2022-12-25: qty 30, 30d supply, fill #0
  Filled 2023-03-26: qty 30, 30d supply, fill #1

## 2022-12-26 ENCOUNTER — Other Ambulatory Visit: Payer: Self-pay

## 2022-12-26 MED ORDER — HYDROXYZINE HCL 25 MG PO TABS
25.0000 mg | ORAL_TABLET | Freq: Every evening | ORAL | 1 refills | Status: DC | PRN
Start: 2022-12-26 — End: 2023-10-27
  Filled 2022-12-26: qty 30, 30d supply, fill #0
  Filled 2023-09-11: qty 30, 30d supply, fill #1

## 2022-12-27 DIAGNOSIS — G4733 Obstructive sleep apnea (adult) (pediatric): Secondary | ICD-10-CM | POA: Diagnosis not present

## 2022-12-28 DIAGNOSIS — G4733 Obstructive sleep apnea (adult) (pediatric): Secondary | ICD-10-CM | POA: Diagnosis not present

## 2023-01-13 ENCOUNTER — Encounter: Payer: Self-pay | Admitting: Nurse Practitioner

## 2023-01-13 ENCOUNTER — Other Ambulatory Visit (HOSPITAL_COMMUNITY): Payer: Self-pay

## 2023-01-13 ENCOUNTER — Other Ambulatory Visit: Payer: Self-pay

## 2023-01-13 ENCOUNTER — Ambulatory Visit: Payer: Commercial Managed Care - PPO | Admitting: Nurse Practitioner

## 2023-01-13 VITALS — BP 109/74 | HR 85 | Temp 98.4°F | Ht 62.0 in | Wt 199.0 lb

## 2023-01-13 DIAGNOSIS — Z7985 Long-term (current) use of injectable non-insulin antidiabetic drugs: Secondary | ICD-10-CM | POA: Diagnosis not present

## 2023-01-13 DIAGNOSIS — E669 Obesity, unspecified: Secondary | ICD-10-CM | POA: Diagnosis not present

## 2023-01-13 DIAGNOSIS — D72829 Elevated white blood cell count, unspecified: Secondary | ICD-10-CM

## 2023-01-13 DIAGNOSIS — Z6836 Body mass index (BMI) 36.0-36.9, adult: Secondary | ICD-10-CM | POA: Diagnosis not present

## 2023-01-13 DIAGNOSIS — E119 Type 2 diabetes mellitus without complications: Secondary | ICD-10-CM | POA: Diagnosis not present

## 2023-01-13 MED ORDER — SEMAGLUTIDE (1 MG/DOSE) 4 MG/3ML ~~LOC~~ SOPN
1.0000 mg | PEN_INJECTOR | SUBCUTANEOUS | 0 refills | Status: DC
Start: 2023-01-13 — End: 2023-04-02
  Filled 2023-01-13 – 2023-01-22 (×2): qty 3, 28d supply, fill #0

## 2023-01-13 NOTE — Progress Notes (Signed)
Office: [phone]  /  Fax: [phone]  WEIGHT SUMMARY AND BIOMETRICS  Weight Lost Since Last Visit: 3lb  Weight Gained Since Last Visit: 0lb   Vitals Temp: 98.4 F (36.9 C) BP: 109/74 Pulse Rate: 85 SpO2: 96 %   Anthropometric Measurements Height: 5\' 2"  (1.575 m) Weight: 199 lb (90.3 kg) BMI (Calculated): 36.39 Weight at Last Visit: 202lb Weight Lost Since Last Visit: 3lb Weight Gained Since Last Visit: 0lb Starting Weight: 208lb Total Weight Loss (lbs): 9 lb (4.082 kg)   Body Composition  Body Fat %: 42.9 % Fat Mass (lbs): 85.6 lbs Muscle Mass (lbs): 108 lbs Total Body Water (lbs): 76 lbs Visceral Fat Rating : 10   Other Clinical Data Fasting: No Labs: No Today's Visit #: 8 Starting Date: 09/17/22     HPI  Chief Complaint: OBESITY  Abigail Frank is here to discuss her progress with her obesity treatment plan. She is on the the Category 2 Plan and states she is following her eating plan approximately 50 % of the time. She states she is exercising 0 minutes 0 days per week.   Interval History:  Since last office visit she has lost 3 pounds.  She went on vacation since her last visit. Getting back on track.      Pharmacotherapy for DMT2:  She is currently taking Ozemic 1mg .  Reports side effects of nausea and reflux.  Takes Prilosec 2 times per week. Denies constipation.  Overall is feeling better since stopping Motrin.  See my note from 12/03/22.    Has appt with GI on 02/10/23 Last A1c was 6.2 She is not checking BS at home.   Episodes of hypoglycemia: no No on ACE or ARB, ASA 81mg  and statin.  Last eye exam:  sept 2023 She has tried Glyburide in the past.    Lab Results  Component Value Date   HGBA1C 6.2 (H) 10/31/2019   Lab Results  Component Value Date   LDLCALC 104 (H) 09/17/2022   CREATININE 0.74 12/03/2022      PHYSICAL EXAM:  Blood pressure 109/74, pulse 85, temperature 98.4 F (36.9 C), height 5\' 2"  (1.575 m), weight 199 lb  (90.3 kg), last menstrual period 12/20/2022, SpO2 96%. Body mass index is 36.4 kg/m.  General: She is overweight, cooperative, alert, well developed, and in no acute distress. PSYCH: Has normal mood, affect and thought process.   Extremities: No edema.  Neurologic: No gross sensory or motor deficits. No tremors or fasciculations noted.    DIAGNOSTIC DATA REVIEWED:  BMET    Component Value Date/Time   NA 139 12/03/2022 1454   K 4.2 12/03/2022 1454   CL 99 12/03/2022 1454   CO2 24 12/03/2022 1454   GLUCOSE 90 12/03/2022 1454   GLUCOSE 151 (H) 06/16/2017 2322   BUN 16 12/03/2022 1454   CREATININE 0.74 12/03/2022 1454   CALCIUM 10.2 12/03/2022 1454   GFRNONAA 115 10/31/2019 1518   GFRAA 132 10/31/2019 1518   Lab Results  Component Value Date   HGBA1C 6.2 (H) 10/31/2019   Lab Results  Component Value Date   INSULIN 21.5 09/17/2022   Lab Results  Component Value Date   TSH 2.280 09/17/2022   CBC    Component Value Date/Time   WBC 10.9 (H) 12/03/2022 1454   WBC 8.5 07/14/2017 1209   RBC 5.03 12/03/2022 1454   RBC 5.12 (H) 07/14/2017 1209   HGB 13.5 12/03/2022 1454   HCT 42.3 12/03/2022 1454   PLT 325 12/03/2022  1454   MCV 84 12/03/2022 1454   MCH 26.8 12/03/2022 1454   MCH 27.9 06/16/2017 2322   MCHC 31.9 12/03/2022 1454   MCHC 33.6 07/14/2017 1209   RDW 13.2 12/03/2022 1454   Iron Studies    Component Value Date/Time   IRON 40 10/31/2019 1518   TIBC 333 10/31/2019 1518   FERRITIN 60 10/31/2019 1518   IRONPCTSAT 12 (L) 10/31/2019 1518   Lipid Panel     Component Value Date/Time   CHOL 181 09/17/2022 1042   TRIG 213 (H) 09/17/2022 1042   HDL 40 09/17/2022 1042   LDLCALC 104 (H) 09/17/2022 1042   Hepatic Function Panel     Component Value Date/Time   PROT 7.1 12/03/2022 1454   ALBUMIN 4.6 12/03/2022 1454   AST 15 12/03/2022 1454   ALT 15 12/03/2022 1454   ALKPHOS 90 12/03/2022 1454   BILITOT <0.2 12/03/2022 1454      Component Value Date/Time    TSH 2.280 09/17/2022 1042   Nutritional No results found for: "VD25OH"   ASSESSMENT AND PLAN  TREATMENT PLAN FOR OBESITY:  Recommended Dietary Goals  Abigail Frank is currently in the action stage of change. As such, her goal is to continue weight management plan. She has agreed to the Category 2 Plan.  Behavioral Intervention  We discussed the following Behavioral Modification Strategies today: increasing lean protein intake, decreasing simple carbohydrates , increasing vegetables, increasing lower glycemic fruits, avoiding skipping meals, increasing water intake, work on meal planning and preparation, continue to practice mindfulness when eating, and planning for success.  Additional resources provided today: NA  Recommended Physical Activity Goals  Abigail Frank has been advised to work up to 150 minutes of moderate intensity aerobic activity a week and strengthening exercises 2-3 times per week for cardiovascular health, weight loss maintenance and preservation of muscle mass.   She has agreed to Think about ways to increase daily physical activity and overcoming barriers to exercise and Increase physical activity in their day and reduce sedentary time (increase NEAT).   ASSOCIATED CONDITIONS ADDRESSED TODAY  Action/Plan  Leukocytosis, unspecified type Discussed rechecking labs today.  Denies fever, chills, vomiting, etc.  Will continue to monitor.    Controlled type 2 diabetes mellitus without complication, without long-term current use of insulin (HCC) -     continue Semaglutide (1 MG/DOSE); Inject 1 mg into the skin once a week.  Dispense: 3 mL; Refill: 0.  Side effects discussed.  Patient is overall feeling better.  Will continue to monitor.    Generalized obesity  BMI 36.0-36.9,adult     Keep appt with GI.      Return in about 4 weeks (around 02/10/2023).Marland Kitchen She was informed of the importance of frequent follow up visits to maximize her success with intensive lifestyle  modifications for her multiple health conditions.   ATTESTASTION STATEMENTS:  Reviewed by clinician on day of visit: allergies, medications, problem list, medical history, surgical history, family history, social history, and previous encounter notes.     Theodis Sato. Coralie Stanke FNP-C

## 2023-01-17 ENCOUNTER — Other Ambulatory Visit (HOSPITAL_COMMUNITY): Payer: Self-pay

## 2023-01-19 ENCOUNTER — Other Ambulatory Visit: Payer: Self-pay

## 2023-01-22 ENCOUNTER — Other Ambulatory Visit: Payer: Self-pay

## 2023-01-22 ENCOUNTER — Other Ambulatory Visit (HOSPITAL_COMMUNITY): Payer: Self-pay

## 2023-01-26 DIAGNOSIS — G4733 Obstructive sleep apnea (adult) (pediatric): Secondary | ICD-10-CM | POA: Diagnosis not present

## 2023-01-27 ENCOUNTER — Ambulatory Visit: Payer: Commercial Managed Care - PPO

## 2023-01-27 DIAGNOSIS — G4733 Obstructive sleep apnea (adult) (pediatric): Secondary | ICD-10-CM | POA: Diagnosis not present

## 2023-02-03 ENCOUNTER — Ambulatory Visit: Payer: Commercial Managed Care - PPO

## 2023-02-10 ENCOUNTER — Ambulatory Visit: Payer: Commercial Managed Care - PPO | Admitting: Nurse Practitioner

## 2023-02-10 ENCOUNTER — Ambulatory Visit: Payer: Commercial Managed Care - PPO | Admitting: Gastroenterology

## 2023-02-13 DIAGNOSIS — E781 Pure hyperglyceridemia: Secondary | ICD-10-CM | POA: Diagnosis not present

## 2023-02-13 DIAGNOSIS — E119 Type 2 diabetes mellitus without complications: Secondary | ICD-10-CM | POA: Diagnosis not present

## 2023-02-13 DIAGNOSIS — R7989 Other specified abnormal findings of blood chemistry: Secondary | ICD-10-CM | POA: Diagnosis not present

## 2023-02-17 ENCOUNTER — Ambulatory Visit: Payer: Commercial Managed Care - PPO | Attending: Obstetrics & Gynecology

## 2023-02-17 DIAGNOSIS — M6281 Muscle weakness (generalized): Secondary | ICD-10-CM | POA: Diagnosis not present

## 2023-02-17 DIAGNOSIS — M62838 Other muscle spasm: Secondary | ICD-10-CM | POA: Diagnosis not present

## 2023-02-17 DIAGNOSIS — R102 Pelvic and perineal pain: Secondary | ICD-10-CM | POA: Diagnosis not present

## 2023-02-17 DIAGNOSIS — R279 Unspecified lack of coordination: Secondary | ICD-10-CM | POA: Diagnosis not present

## 2023-02-17 DIAGNOSIS — M25552 Pain in left hip: Secondary | ICD-10-CM | POA: Diagnosis not present

## 2023-02-17 DIAGNOSIS — M5459 Other low back pain: Secondary | ICD-10-CM | POA: Diagnosis not present

## 2023-02-17 DIAGNOSIS — R252 Cramp and spasm: Secondary | ICD-10-CM | POA: Diagnosis not present

## 2023-02-17 NOTE — Therapy (Signed)
OUTPATIENT PHYSICAL THERAPY TREATMENT NOTE   Patient Name: Abigail Frank MRN: [id number] DOB:[birth year], 37 y.o., female Today's Date: 02/17/2023  PCP: Gaspar Garbe, MD REFERRING PROVIDER: Jerene Bears, MD   END OF SESSION:   PT End of Session - 02/17/23 1235     Visit Number 8    Date for PT Re-Evaluation 02/19/23    Authorization Type [employer]    PT Start Time 1230    PT Stop Time 1310    PT Time Calculation (min) 40 min    Activity Tolerance Patient tolerated treatment well    Behavior During Therapy WFL for tasks assessed/performed                Past Medical History:  Diagnosis Date   Anxiety    Asthma    Bone spur    cervical L side; L side arm numbness intermittent    Depression    Diabetes in pregnancy    Diabetes type 2, controlled (HCC)    Elevated cholesterol with high triglycerides    Gestational diabetes    Hx of varicella    IBS (irritable bowel syndrome)    Kidney stones    followed by Dr. Kathrynn Running   Migraines    without aura   Ovarian cyst    Postpartum care following vaginal delivery (1/30) 07/15/2013   Preterm uterine contractions 06/10/2013   Sleep apnea    Vitamin D deficiency    Past Surgical History:  Procedure Laterality Date   MOUTH SURGERY     wisdom teeth extractions   Patient Active Problem List   Diagnosis Date Noted   BMI 37.0-37.9, adult 10/15/2022   Low serum cortisol level 10/01/2022   Polyphagia 10/01/2022   Other fatigue 09/17/2022   SOB (shortness of breath) on exertion 09/17/2022   Vitamin D deficiency 09/17/2022   Health care maintenance 09/17/2022   BMI 38.0-38.9,adult 09/17/2022   Striae 09/17/2022   Vitamin B 12 deficiency 09/17/2022   Hypertriglyceridemia 09/17/2022   Generalized obesity 07/01/2021   History of menorrhagia 07/01/2021   Fecal smearing 07/01/2021   Anxiety 07/01/2021   Diabetes type 2, controlled (HCC) 06/28/2021   Carpal tunnel syndrome of left wrist 12/26/2018    Chronic migraine without aura without status migrainosus, not intractable 10/09/2018    REFERRING DIAG: M62.89 (ICD-10-CM) - Pelvic floor dysfunction R10.2 (ICD-10-CM) - Pelvic pain  THERAPY DIAG:  Other low back pain  Pain in left hip  Pelvic pain  Other muscle spasm  Muscle weakness (generalized)  Unspecified lack of coordination  Cramp and spasm  Rationale for Evaluation and Treatment Rehabilitation  PERTINENT HISTORY: IBS, migraines, G2P2 with significant tearing  PRECAUTIONS: NA  SUBJECTIVE:  SUBJECTIVE STATEMENT: Pt states that her Lt hip is staying aggravated. She is working very hard at [employer], but relief is short lived. As soon as she goes to pick something up, pain returns. She feels like she is having more issues with constipation and is having to splint.    PAIN:  Are you having pain? Yes NPRS scale: 4/10 Pain location: bil abdominal; low back pain, Lt hip pain  Pain type: sharp and cramping Pain description: constant   Aggravating factors: bending/twisting  Relieving factors: heat, ibuprofen    09/04/22 SUBJECTIVE STATEMENT: Pt states that she was in PT before due to fecal smearing and she has seen good progress with that. Now, her biggest complaint is lower abdominal cramping. She states that she has some vaginal tenderness. She feels like she is on her period/crampy all the time. She has Lt hip pain that does not get better. She has been having heavier menstrual cycles with lots of clotting. She is on progesterone only birth control.  Fluid intake: Yes: 64 oz a day, caffeine drinks   PAIN:  Are you having pain? Yes NPRS scale: 3/10 Pain location: bil abdominal; low back pain, Lt hip pain  Pain type: sharp and cramping Pain description: constant    Aggravating factors: uncertain Relieving factors: heat, ibuprofen   PRECAUTIONS: None  WEIGHT BEARING RESTRICTIONS: No  FALLS:  Has patient fallen in last 6 months? No  LIVING ENVIRONMENT: Lives with: lives with their family Lives in: House/apartment   OCCUPATION: nurse at [employer]  PLOF: Independent  PATIENT GOALS: decrease pain  PERTINENT HISTORY:  IBS, migraines, G2P2 with significant tearing Sexual abuse: No  BOWEL MOVEMENT: Pain with bowel movement: No Type of bowel movement:Frequency at least 1x/day - sometimes multiple with ozempic and Strain No Fully empty rectum: No Leakage: No - very rare Pads: No Fiber supplement: No  URINATION: Pain with urination: No Fully empty bladder: Yes: - Stream: Has to bear down a little to start stream Urgency: No Frequency: every 2 hours Leakage: none Pads: No   INTERCOURSE: Pain with intercourse: During Penetration - can feel in Lt butt cheek Ability to have vaginal penetration:  Yes: but can be painful Climax: rarely and needs significant stimulation with vibrator   PREGNANCY: Vaginal deliveries 2 Tearing Yes: significant tear with 2nd 9 years ago C-section deliveries 0 Currently pregnant No  PROLAPSE: Cystocele - and Rectocele -   OBJECTIVE:  02/17/23 Lt posterior rotation in pelvis  11/03/22 No emotional/communication barriers or cognitive limitation. Patient is motivated to learn. Patient understands and agrees with treatment goals and plan. PT explains patient will be examined in standing, sitting, and lying down to see how their muscles and joints work. When they are ready, they will be asked to remove their underwear so PT can examine their perineum. The patient is also given the option of providing their own chaperone as one is not provided in our facility. The patient also has the right and is explained the right to defer or refuse any part of the evaluation or treatment including the  internal exam. With the patient's consent, PT will use one gloved finger to gently assess the muscles of the pelvic floor, seeing how well it contracts and relaxes and if there is muscle symmetry. After, the patient will get dressed and PT and patient will discuss exam findings and plan of care. PT and patient discuss plan of care, schedule, attendance policy and HEP activities.  External Perineal Exam WNL                             Internal Pelvic Floor Disocmfort and high  Patient confirms identification and approves PT to assess internal pelvic floor and treatment Yes - next treatment session   PELVIC MMT:   MMT eval  Vaginal 3/5  Internal Anal Sphincter   External Anal Sphincter   Puborectalis 2/5  Diastasis Recti 2 finger width separation with distortion upon increased abdominal pressure  (Blank rows = not tested)        TONE: High  09/04/22:  DIAGNOSTIC FINDINGS:  Abdominal US  COGNITION: Overall cognitive status: Within functional limits for tasks assessed     SENSATION: Light touch: Appears intact Proprioception: Appears intact  FUNCTIONAL TESTS:  -Squat: Lt weight shift, Rt rotation -Single leg stance: Rt 4 seconds, Lt 10 seconds  GAIT: Comments: WNL  POSTURE: rounded shoulders, forward head, decreased thoracic kyphosis, and posterior pelvic tilt, Rt shoulder and iliac crest elevation, mild Lt lower thoracic curvature   LUMBARAROM/PROM:  A/PROM A/PROM  Eval (% available)  Flexion 100, very painful  Extension 50, very painful  Right lateral flexion 50  Left lateral flexion 50, pain on left  Right rotation 50  Left rotation 50   (Blank rows = not tested)  LOWER EXTREMITY MMT:  MMT Right eval Left eval  Hip flexion 4 4  Hip extension 4+ 4+  Hip abduction 4+ 4+  Hip adduction 3+, pain at pubic symphysis 3+, pain at pubic symphysis  Hip internal rotation 4 4  Hip external rotation 4 4   PALPATION:   General  Tenderness throughout  bil/central lower abdomen; mild tenderness at upper abdominal rib attachments; pubic symphysis discomfort; tenderness with palpation of low back, sacrum, and coccyx    TODAY'S TREATMENT                                                                                                                              DATE:  02/17/23 Manual: Trigger Point Dry-Needling  Treatment instructions: Expect mild to moderate muscle soreness. S/S of pneumothorax if dry needled over a lung field, and to seek immediate medical attention should they occur. Patient verbalized understanding of these instructions and education.  Patient Consent Given: Yes Education handout provided: Previously provided Muscles treated: Lt glutes Electrical stimulation performed: No Parameters: N/A Treatment response/outcome: twitch response/release Soft tissue mobilization to lumbar paraspinals and Lt glutes Neuromuscular re-education: MET for Lt posterior rotation Shotgun technique Modified side plank with clam shell 10x bil Single leg bridge 10x bil Modified dead bug 10x bil 3-way kick 10x each, bil Single leg dead lift 10x bil   12-08-22 Manual: Trigger Point Dry-Needling  Treatment instructions: Expect mild to moderate muscle soreness. S/S of pneumothorax if dry needled over a lung field, and to seek immediate medical attention should they occur. Patient verbalized understanding of  these instructions and education.  Patient Consent Given: Yes Education handout provided: Previously provided Muscles treated: Lt lumbar paraspinals, QL, glutes Electrical stimulation performed: No Parameters: N/A Treatment response/outcome: twitch response/release Soft tissue mobilization to Lt low back/hip Pt provides verbal consent for internal vaginal/rectal pelvic floor exam. Internal rectal release of pelvic floor Coccyx traction/mobilization   11/24/22 Manual: Trigger Point Dry-Needling  Treatment instructions: Expect mild to  moderate muscle soreness. S/S of pneumothorax if dry needled over a lung field, and to seek immediate medical attention should they occur. Patient verbalized understanding of these instructions and education.  Patient Consent Given: Yes Education handout provided: Previously provided Muscles treated: Lt lumbar paraspinals, QL, glutes Electrical stimulation performed: No Parameters: N/A Treatment response/outcome: twitch response/release Soft tissue mobilization to Lt low back/hip  Exercises: Lateral child's pose 2 min bil Peanut ball roll outs 10x Lateral flexion over peanut ball 4 minutes bil Seated side stretch with rotation 4 min bil Lizard stretch 2 min bil   PATIENT EDUCATION:  Education details: see above Person educated: Patient Education method: Programmer, multimedia, Facilities manager, Actor cues, Verbal cues, and Handouts Education comprehension: verbalized understanding  HOME EXERCISE PROGRAM: [id number]  ASSESSMENT:  CLINICAL IMPRESSION: Pt is still having notable Lt sided low back/hip pain. Believe this is related to pelvic floor/hip/low back spasm and pelvic instability from weakness. Believe progress has been slow due to being inconsistent with appointments and having to perform pain management each session instead of being able to progress strengthening activities. She tolerated dry needling well today and was able to progress core exercises. We assessed pelvic rotation and found that she does have Lt posterior rotation and we were able to correct and stabilize; she was given these techniques to perform at home and see if it does not help with pain; she is then to follow up with strengthening exercises. Pt reported decrease in pain from 4/10 to 0/10 at end of session. She will continue to benefit from skilled PT intervention in order to decrease pain, improve core strength/stability, and return to functional activities without difficulty.  OBJECTIVE IMPAIRMENTS: decreased activity  tolerance, decreased coordination, decreased endurance, decreased strength, increased fascial restrictions, increased muscle spasms, impaired tone, postural dysfunction, and pain.   ACTIVITY LIMITATIONS: lifting, bending, sitting, standing, squatting, and sleeping  PARTICIPATION LIMITATIONS: cleaning, interpersonal relationship, community activity, and occupation  PERSONAL FACTORS: 1 comorbidity: medical history are also affecting patient's functional outcome.   REHAB POTENTIAL: Good  CLINICAL DECISION MAKING: Stable/uncomplicated  EVALUATION COMPLEXITY: Low   GOALS: Goals reviewed with patient? Yes  SHORT TERM GOALS: Target date: 10/09/22- updated 10/09/22 - - updated 11/17/22  Pt will be independent with HEP.   Baseline: Goal status: MET 11/17/22  2.  Pt will be able to correctly perform diaphragmatic breathing and appropriate pressure management in order to prevent worsening vaginal wall laxity and improve pelvic floor A/ROM.   Baseline:  Goal status: MET 11/17/22  3.  Pt will be independent with diaphragmatic breathing and down training activities in order to improve pelvic floor relaxation.  Baseline:  Goal status: MET 11/17/22  4.  Pt will be independent with use of squatty potty, relaxed toileting mechanics, and improved bowel movement techniques in order to increase ease of bowel movements and complete evacuation.   Baseline:  Goal status: MET 11/17/22   LONG TERM GOALS: Target date: 02/19/23 - updated 10/09/22 - updated 11/17/22  Pt will be independent with advanced HEP.   Baseline:  Goal status: IN PROGRESS  2.  Pt will  be able to perform all work duties without increased pelvic/low back pain.  Baseline:  Goal status: IN PROGRESS  3.  Pt will deny any abdominal cramping greater than 1/10.  Baseline:  Goal status: IN PROGRESS  4.  Pt will be able to have complete bowel movements without straining and use of appropriate mechanics.  Baseline:  Goal status: IN  PROGRESS  5.  Pt will report 0/10 pain with vaginal penetration in order to improve intimate relationship with partner.    Baseline:  Goal status: IN PROGRESS  6.  Pt will increase all impaired lumbar A/ROM by 25% without pain.  Baseline:  Goal status: IN PROGRESS  PLAN:  PT FREQUENCY: 1-2x/week  PT DURATION: 6 months  PLANNED INTERVENTIONS: Therapeutic exercises, Therapeutic activity, Neuromuscular re-education, Balance training, Gait training, Patient/Family education, Self Care, Joint mobilization, Dry Needling, Biofeedback, and Manual therapy  PLAN FOR NEXT SESSION: Re-evaluation; progress standing core activities .  Julio Alm, PT, DPT09/03/241:13 PM

## 2023-02-19 ENCOUNTER — Other Ambulatory Visit (HOSPITAL_COMMUNITY): Payer: Self-pay

## 2023-02-20 DIAGNOSIS — R82998 Other abnormal findings in urine: Secondary | ICD-10-CM | POA: Diagnosis not present

## 2023-02-23 ENCOUNTER — Ambulatory Visit: Payer: Commercial Managed Care - PPO

## 2023-02-23 ENCOUNTER — Other Ambulatory Visit: Payer: Self-pay

## 2023-02-23 ENCOUNTER — Other Ambulatory Visit (HOSPITAL_COMMUNITY): Payer: Self-pay

## 2023-02-23 DIAGNOSIS — R279 Unspecified lack of coordination: Secondary | ICD-10-CM

## 2023-02-23 DIAGNOSIS — M62838 Other muscle spasm: Secondary | ICD-10-CM

## 2023-02-23 DIAGNOSIS — R252 Cramp and spasm: Secondary | ICD-10-CM | POA: Diagnosis not present

## 2023-02-23 DIAGNOSIS — M5459 Other low back pain: Secondary | ICD-10-CM

## 2023-02-23 DIAGNOSIS — M6281 Muscle weakness (generalized): Secondary | ICD-10-CM

## 2023-02-23 DIAGNOSIS — R102 Pelvic and perineal pain unspecified side: Secondary | ICD-10-CM

## 2023-02-23 DIAGNOSIS — M25552 Pain in left hip: Secondary | ICD-10-CM

## 2023-02-23 MED ORDER — ALPRAZOLAM 0.5 MG PO TABS
0.5000 mg | ORAL_TABLET | Freq: Two times a day (BID) | ORAL | 3 refills | Status: DC | PRN
  Filled 2023-02-23: qty 30, 15d supply, fill #0
  Filled 2023-03-27: qty 30, 15d supply, fill #1
  Filled 2023-05-07: qty 30, 15d supply, fill #2
  Filled 2023-07-02: qty 30, 15d supply, fill #3

## 2023-02-23 NOTE — Therapy (Addendum)
OUTPATIENT PHYSICAL THERAPY TREATMENT NOTE   Patient Name: Abigail Frank MRN: [id number] DOB:1985-07-27, 37 y.o., female Today's Date: 02/23/2023  PCP: Gaspar Garbe, MD REFERRING PROVIDER: Jerene Bears, MD   END OF SESSION:   PT End of Session - 02/23/23 1148     Visit Number 9    Date for PT Re-Evaluation 08/10/23    Authorization Type [employer]    PT Start Time 1145    PT Stop Time 1225    PT Time Calculation (min) 40 min    Activity Tolerance Patient tolerated treatment well    Behavior During Therapy [facility] for tasks assessed/performed                 Past Medical History:  Diagnosis Date   Anxiety    Asthma    Bone spur    cervical L side; L side arm numbness intermittent    Depression    Diabetes in pregnancy    Diabetes type 2, controlled (HCC)    Elevated cholesterol with high triglycerides    Gestational diabetes    Hx of varicella    IBS (irritable bowel syndrome)    Kidney stones    followed by Dr. Kathrynn Running   Migraines    without aura   Ovarian cyst    Postpartum care following vaginal delivery (1/30) 07/15/2013   Preterm uterine contractions 06/10/2013   Sleep apnea    Vitamin D deficiency    Past Surgical History:  Procedure Laterality Date   MOUTH SURGERY     wisdom teeth extractions   Patient Active Problem List   Diagnosis Date Noted   BMI 37.0-37.9, adult 10/15/2022   Low serum cortisol level 10/01/2022   Polyphagia 10/01/2022   Other fatigue 09/17/2022   SOB (shortness of breath) on exertion 09/17/2022   Vitamin D deficiency 09/17/2022   Health care maintenance 09/17/2022   BMI 38.0-38.9,adult 09/17/2022   Striae 09/17/2022   Vitamin B 12 deficiency 09/17/2022   Hypertriglyceridemia 09/17/2022   Generalized obesity 07/01/2021   History of menorrhagia 07/01/2021   Fecal smearing 07/01/2021   Anxiety 07/01/2021   Diabetes type 2, controlled (HCC) 06/28/2021   Carpal tunnel syndrome of left wrist  12/26/2018   Chronic migraine without aura without status migrainosus, not intractable 10/09/2018    REFERRING DIAG: M62.89 (ICD-10-CM) - Pelvic floor dysfunction R10.2 (ICD-10-CM) - Pelvic pain  THERAPY DIAG:  Other low back pain  Pain in left hip  Pelvic pain  Other muscle spasm  Muscle weakness (generalized)  Unspecified lack of coordination  Rationale for Evaluation and Treatment Rehabilitation  PERTINENT HISTORY: IBS, migraines, G2P2 with significant tearing  PRECAUTIONS: NA  SUBJECTIVE:  SUBJECTIVE STATEMENT: Pt states that she did very well after last session and feels like it was very helpful. She has continued working on MET techniques to work on pelvic alignment and feels like it is helpful in reducing pain. She feels like she has even been able to walk without waddling now.    PAIN:  Are you having pain? Yes NPRS scale: 0/10 Pain location: bil abdominal; low back pain, Lt hip pain  Pain type: sharp and cramping Pain description: constant   Aggravating factors: bending/twisting  Relieving factors: heat, ibuprofen    09/04/22 SUBJECTIVE STATEMENT: Pt states that she was in PT before due to fecal smearing and she has seen good progress with that. Now, her biggest complaint is lower abdominal cramping. She states that she has some vaginal tenderness. She feels like she is on her period/crampy all the time. She has Lt hip pain that does not get better. She has been having heavier menstrual cycles with lots of clotting. She is on progesterone only birth control.  Fluid intake: Yes: 64 oz a day, caffeine drinks   PAIN:  Are you having pain? Yes NPRS scale: 3/10 Pain location: bil abdominal; low back pain, Lt hip pain  Pain type: sharp and cramping Pain description: constant    Aggravating factors: uncertain Relieving factors: heat, ibuprofen   PRECAUTIONS: None  WEIGHT BEARING RESTRICTIONS: No  FALLS:  Has patient fallen in last 6 months? No  LIVING ENVIRONMENT: Lives with: lives with their family Lives in: House/apartment   OCCUPATION: nurse at [employer]  PLOF: Independent  PATIENT GOALS: decrease pain  PERTINENT HISTORY:  IBS, migraines, G2P2 with significant tearing Sexual abuse: No  BOWEL MOVEMENT: Pain with bowel movement: No Type of bowel movement:Frequency at least 1x/day - sometimes multiple with ozempic and Strain No Fully empty rectum: No Leakage: No - very rare Pads: No Fiber supplement: No  URINATION: Pain with urination: No Fully empty bladder: Yes: - Stream: Has to bear down a little to start stream Urgency: No Frequency: every 2 hours Leakage: none Pads: No   INTERCOURSE: Pain with intercourse: During Penetration - can feel in Lt butt cheek Ability to have vaginal penetration:  Yes: but can be painful Climax: rarely and needs significant stimulation with vibrator   PREGNANCY: Vaginal deliveries 2 Tearing Yes: significant tear with 2nd 9 years ago C-section deliveries 0 Currently pregnant No  PROLAPSE: Cystocele - and Rectocele -   OBJECTIVE:  02/23/23 LUMBARAROM/PROM:  A/PROM A/PROM  Eval (% available)  Flexion 100, no pain  Extension 100, 3-4/10  Right lateral flexion 75  Left lateral flexion 75, pain on left  Right rotation 75  Left rotation 50, pain on left   (Blank rows = not tested)  PELVIC ALIGNMENT: no rotation present  02/17/23 Lt posterior rotation in pelvis  11/03/22 No emotional/communication barriers or cognitive limitation. Patient is motivated to learn. Patient understands and agrees with treatment goals and plan. PT explains patient will be examined in standing, sitting, and lying down to see how their muscles and joints work. When they are ready, they will be  asked to remove their underwear so PT can examine their perineum. The patient is also given the option of providing their own chaperone as one is not provided in our facility. The patient also has the right and is explained the right to defer or refuse any part of the evaluation or treatment including the internal exam. With the patient's consent, PT will use one  gloved finger to gently assess the muscles of the pelvic floor, seeing how well it contracts and relaxes and if there is muscle symmetry. After, the patient will get dressed and PT and patient will discuss exam findings and plan of care. PT and patient discuss plan of care, schedule, attendance policy and HEP activities.                External Perineal Exam WNL                             Internal Pelvic Floor Disocmfort and high  Patient confirms identification and approves PT to assess internal pelvic floor and treatment Yes - next treatment session   PELVIC MMT:   MMT eval  Vaginal 3/5  Internal Anal Sphincter   External Anal Sphincter   Puborectalis 2/5  Diastasis Recti 2 finger width separation with distortion upon increased abdominal pressure  (Blank rows = not tested)        TONE: High  09/04/22:  DIAGNOSTIC FINDINGS:  Abdominal US  COGNITION: Overall cognitive status: Within functional limits for tasks assessed     SENSATION: Light touch: Appears intact Proprioception: Appears intact  FUNCTIONAL TESTS:  -Squat: Lt weight shift, Rt rotation -Single leg stance: Rt 4 seconds, Lt 10 seconds  GAIT: Comments: WNL  POSTURE: rounded shoulders, forward head, decreased thoracic kyphosis, and posterior pelvic tilt, Rt shoulder and iliac crest elevation, mild Lt lower thoracic curvature   LUMBARAROM/PROM:  A/PROM A/PROM  Eval (% available)  Flexion 100, very painful  Extension 50, very painful  Right lateral flexion 50  Left lateral flexion 50, pain on left  Right rotation 50  Left rotation 50   (Blank rows  = not tested)  LOWER EXTREMITY MMT:  MMT Right eval Left eval  Hip flexion 4 4  Hip extension 4+ 4+  Hip abduction 4+ 4+  Hip adduction 3+, pain at pubic symphysis 3+, pain at pubic symphysis  Hip internal rotation 4 4  Hip external rotation 4 4   PALPATION:   General  Tenderness throughout bil/central lower abdomen; mild tenderness at upper abdominal rib attachments; pubic symphysis discomfort; tenderness with palpation of low back, sacrum, and coccyx    TODAY'S TREATMENT                                                                                                                              DATE:  02/23/23 Manual: Trigger Point Dry-Needling  Treatment instructions: Expect mild to moderate muscle soreness. S/S of pneumothorax if dry needled over a lung field, and to seek immediate medical attention should they occur. Patient verbalized understanding of these instructions and education.  Patient Consent Given: Yes Education handout provided: Previously provided Muscles treated: Lt glutes Electrical stimulation performed: No Parameters: N/A Treatment response/outcome: twitch response/release Soft tissue mobilization to lumbar paraspinals and Lt glutes Neuromuscular re-education: Single leg bridge with 5lb  single arm hold 2 x 10 bil Modified side plank with clam shell/leg extension 2 x 8 bil Modified dead bug 2 x 10 bil Single leg dead lift 10x 10lb kettle bell 3 way kick yellow band 10x bil   02/17/23 Manual: Trigger Point Dry-Needling  Treatment instructions: Expect mild to moderate muscle soreness. S/S of pneumothorax if dry needled over a lung field, and to seek immediate medical attention should they occur. Patient verbalized understanding of these instructions and education.  Patient Consent Given: Yes Education handout provided: Previously provided Muscles treated: Lt glutes Electrical stimulation performed: No Parameters: N/A Treatment response/outcome: twitch  response/release Soft tissue mobilization to lumbar paraspinals and Lt glutes Neuromuscular re-education: MET for Lt posterior rotation Shotgun technique Modified side plank with clam shell 10x bil Single leg bridge 10x bil Modified dead bug 10x bil 3-way kick 10x each, bil Single leg dead lift 10x bil   Dec 23, 2022 Manual: Trigger Point Dry-Needling  Treatment instructions: Expect mild to moderate muscle soreness. S/S of pneumothorax if dry needled over a lung field, and to seek immediate medical attention should they occur. Patient verbalized understanding of these instructions and education.  Patient Consent Given: Yes Education handout provided: Previously provided Muscles treated: Lt lumbar paraspinals, QL, glutes Electrical stimulation performed: No Parameters: N/A Treatment response/outcome: twitch response/release Soft tissue mobilization to Lt low back/hip Pt provides verbal consent for internal vaginal/rectal pelvic floor exam. Internal rectal release of pelvic floor Coccyx traction/mobilization  PATIENT EDUCATION:  Education details: see above Person educated: Patient Education method: Explanation, Demonstration, Tactile cues, Verbal cues, and Handouts Education comprehension: verbalized understanding  HOME EXERCISE PROGRAM: [id number]  ASSESSMENT:  CLINICAL IMPRESSION: Pt is doing much better this week with 0/10 pain. She has been performing new strengthening exercises and techniques to improve pelvic alignment regularly and has found them both to be very helpful. With this large improvement after correcting pelvic alignment and focusing on gluteal strengthening, believe that pelvic alignment was a significant contributing factor in patient's pain and we expect to continue seeing good progress as we stabilize this alignment correction. She had great improvement in lumbar A/ROM, but still has some discomfort with extension and Lt rotation/lateral flexion. She will  continue to benefit from skilled PT intervention in order to decrease pain, improve core strength/stability, and return to functional activities without difficulty.  OBJECTIVE IMPAIRMENTS: decreased activity tolerance, decreased coordination, decreased endurance, decreased strength, increased fascial restrictions, increased muscle spasms, impaired tone, postural dysfunction, and pain.   ACTIVITY LIMITATIONS: lifting, bending, sitting, standing, squatting, and sleeping  PARTICIPATION LIMITATIONS: cleaning, interpersonal relationship, community activity, and occupation  PERSONAL FACTORS: 1 comorbidity: medical history are also affecting patient's functional outcome.   REHAB POTENTIAL: Good  CLINICAL DECISION MAKING: Stable/uncomplicated  EVALUATION COMPLEXITY: Low   GOALS: Goals reviewed with patient? Yes  SHORT TERM GOALS: Target date: 10/09/22- updated 10/09/22 - - updated 11/17/22 - updated 02/23/23  Pt will be independent with HEP.   Baseline: Goal status: MET 11/17/22  2.  Pt will be able to correctly perform diaphragmatic breathing and appropriate pressure management in order to prevent worsening vaginal wall laxity and improve pelvic floor A/ROM.   Baseline:  Goal status: MET 11/17/22  3.  Pt will be independent with diaphragmatic breathing and down training activities in order to improve pelvic floor relaxation.  Baseline:  Goal status: MET 11/17/22  4.  Pt will be independent with use of squatty potty, relaxed toileting mechanics, and improved bowel movement techniques in order to increase ease  of bowel movements and complete evacuation.   Baseline:  Goal status: MET 11/17/22   LONG TERM GOALS: Target date: 02/19/23 - updated 10/09/22 - updated 11/17/22 - updated 02/23/23 new goal 08/10/23 Pt will be independent with advanced HEP.   Baseline:  Goal status: IN PROGRESS  2.  Pt will be able to perform all work duties without increased pelvic/low back pain.  Baseline: Can do all work  duties, but does have increased pain on her shifts 50% of the time Goal status: IN PROGRESS  3.  Pt will deny any abdominal cramping greater than 1/10.  Baseline: small amount during ovulation and menstrual cramping Goal status: IN PROGRESS  4.  Pt will be able to have complete bowel movements without straining and use of appropriate mechanics.  Baseline:  Goal status: IN PROGRESS  5.  Pt will report 0/10 pain with vaginal penetration in order to improve intimate relationship with partner.    Baseline: intermittent and unchanged; when she works on manual stretching pain is better, especially right before intimacy Goal status: IN PROGRESS  6.  Pt will increase all impaired lumbar A/ROM by 25% without pain.  Baseline: almost met, Lt rotation is only motion that has not improved and is still uncomfortable; has pain with extension and Rt lateral flexion at Lt SIJ Goal status: IN PROGRESS  PLAN:  PT FREQUENCY: 1-2x/week  PT DURATION: 6 months  PLANNED INTERVENTIONS: Therapeutic exercises, Therapeutic activity, Neuromuscular re-education, Balance training, Gait training, Patient/Family education, Self Care, Joint mobilization, Dry Needling, Biofeedback, and Manual therapy  PLAN FOR NEXT SESSION: progress standing core activities .  Julio Alm, PT, DPT09/02/2411:20 PM  PHYSICAL THERAPY DISCHARGE SUMMARY  Visits from Start of Care: 9  Current functional level related to goals / functional outcomes: Partially met   Remaining deficits: See above   Education / Equipment: HEP   Patient agrees to discharge. Patient goals were partially met. Patient is being discharged due to not returning since the last visit.  Julio Alm, PT, DPT02/13/258:02 AM

## 2023-02-26 DIAGNOSIS — G4733 Obstructive sleep apnea (adult) (pediatric): Secondary | ICD-10-CM | POA: Diagnosis not present

## 2023-02-27 DIAGNOSIS — G4733 Obstructive sleep apnea (adult) (pediatric): Secondary | ICD-10-CM | POA: Diagnosis not present

## 2023-03-02 NOTE — Progress Notes (Deleted)
[facility]    Provider:  Dr Lucia Gaskins Requesting Provider: Tisovec, Adelfa Koh, MD Primary Care Provider:  Gaspar Garbe, MD  CC:  No chief complaint on file.     HPI:  Update 03/03/2023 JM: Returns for follow-up visit.  Previously seen by Shawnie Dapper, NP on 10/29/2022 for migraine and sleep apnea follow-up.  Diagnosed with moderate OSA 02/2022 and initiation of CPAP 03/2022.  Complained of difficulty adjusting to CPAP therapy, recommended mask refitting and encouraged consistent usage.  Migraines well-controlled on Aimovig and gabapentin and use of baclofen, rizatriptan and Zofran as needed.   CPAP compliance report as below showing poor compliance although optimal residual AHI with use.    Migraines remain well-controlled on current regimen. Reports *** migraine days per month.             History provided for reference purposes only Update 10/24/2021 JM: Patient returns for 1 year migraine follow-up.  Overall doing well.  Migraines remain well controlled on Aimovig. She has been experiencing a migraine headache about 1x/week typically upon awakening over the past 6 months.  Previously, would experience about 1 migraine per week but randomly throughout the day.  Pain remains behind right eye and right occipital area.  Will take a rizatriptan and fall asleep for another hour and typically migraine will be resolved.  If severe, will take Zofran and baclofen in addition to rizatriptan with resolution.  Upon further questioning, she does admit to insomnia waking up frequently throughout the night and difficulty falling asleep, fatigue during the day with naps, and snores.  She has not previously underwent sleep study.  She is currently working on weight loss and has lost about 30 pounds since prior visit.  No further concerns at this time.  Update 11/05/2020 JM: Abigail Frank returns for yearly migraine follow-up  Migraines have been well controlled with monthly use  of Aimovig.  She has only been experiencing migraines during her monthly cycle and with increased neck pain.  Reports her PCP recently referred her to [facility] for a bone spur at C2-3 and possible nerve ablation per patient-initial evaluation next week.  Use of Maxalt with some benefit but usually will take up to 2 hours to take effect.  Trialed Nurtec with quick resolution when able to catch early enough. Will also use Zofran and baclofen with benefit.  Will occasionally use tizadine and Reglan but does cause excessive fatigue.   Update 11/03/2019 JM: Abigail Frank returns for migraine follow-up.  Current migraine treatment plan includes  Prevention: Aimovig 140 mg SQ monthly, Emergent as needed: ubrelvy 50 mg as needed and baclofen; if severe unable to sleep, rizatriptan 10 mg and tizanidine 4 mg.  Zofran 4 mg as needed for nausea  Reports severe migraine in 05/2019 with typical migraine onset and location but accompanied by slurred speech, right arm heaviness and unsteadiness.  She has not previously experienced the symptoms.  Pain rated 10/10.  She ended up falling asleep after taking rizatriptan and tizanidine and after awakening 4 hours later, symptoms resolved including migraine but did continue to feel spaced out sensation which is typical for her.  She has not experienced recurrent neurological symptoms since that time.  During the month of March and April, she was experiencing migraines 3-4 times weekly lasting 4 to 6 hours without benefit of emergent medications.  She does admit to missing Aimovig injections in January and February due to home stressors.  Restarted monthly Aimovig in March  and migraines have greatly improved since that time.  She does endorse ongoing bilateral neck pain and feels as though this contributes to migraine onset.  She has trialed massage in the past without great benefit.   Interval history 11/24/2018 Dr. Lucia Gaskins:  Follow up for migraines and new  symptom hand pain.  Patient reports for several years she has had hand pain numbness and tingling mostly in digits 1 through 3 more so on the left but also on the right.  She wakes up in the middle of the night and has to shake her hands out.  She had an MRI of the cervical spine in 2017 due to the symptoms but did not show etiology.  It is worsening.  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  Positive Phalen's sign.  I discussed carpal tunnel syndrome with patient and we will have her in for an EMG nerve conduction study.  She is doing amazing on Aimovig she loves it continue.  Personally reviewed MRI of th cervical spine and agree with the following 2017:   IMPRESSION: Negative for central canal stenosis. Uncovertebral spurring results in some foraminal narrowing on the left appearing most notable at C3-4 with a very mild degree of foraminal narrowing on the left at C4-5 and C5-6.   Initial consult visit 10/07/2018 Dr. Lucia Gaskins:  Abigail Frank is a 37 y.o. female here as requested by Tisovec, Adelfa Koh, MD for migraines. PMHx migraine. No family history that she knows of, her mother is deceased. Struggling for years. Started during her menses as a teenager and have worsened over the years. Worse with life stressors. At least 2-3 migraines. She has 25 headache days a month. At least 12 migraine days a month that can be moderately severe or severe. Pounding/pulsating, interrupts her daily life, she has to lay down, turn off the light, nausea, no vomiting, +photo/phonophbia, Can last 24-72 hours. No medication overuse. No aura. Ongoing for over a year. Migraines starts as tension in the neck and creep up the back of her neck, pins and needles, to the right and then all over, pain behind the right eye, right side is the worse side. They can be positional. She wakes with them in the morning. Her left arm goes numb, tried a steroid pack. She follows with an orthopaedist for her neck. Positions of her head  can cause the headaches. Neck pain and tightness. No other focal neurologic deficits, associated symptoms, inciting events or modifiable factors.  Meds tried: zoloft. Topamax(made her "crazy"), ubrelvy, baclofen, imitrex..      Review of Systems: Patient complains of symptoms per HPI as well as the following symptoms: Headaches.  Pertinent negatives and positives per HPI. All others negative.   Social History   Socioeconomic History   Marital status: Married    Spouse name: Not on file   Number of children: 2   Years of education: Not on file   Highest education level: Bachelor's degree (e.g., BA, AB, BS)  Occupational History   Not on file  Tobacco Use   Smoking status: Never   Smokeless tobacco: Never  Vaping Use   Vaping status: Never Used  Substance and Sexual Activity   Alcohol use: Yes    Comment: on occasion a glass of wine   Drug use: Never   Sexual activity: Yes    Birth control/protection: OCP  Other Topics Concern   Not on file  Social History Narrative   Lives at home  with her family   Left handed   Caffeine: regular, at least 2 cups of coffee daily   Social Determinants of Health   Financial Resource Strain: Not on file  Food Insecurity: Not on file  Transportation Needs: Not on file  Physical Activity: Not on file  Stress: Not on file  Social Connections: Not on file  Intimate Partner Violence: Not on file    Family History  Problem Relation Age of Onset   Hypertension Mother    Hypothyroidism Mother    Heart attack Mother    Diabetes Mother    Heart disease Mother        heart failure   Dementia Mother    Kidney disease Mother    Sleep apnea Mother    Hypertension Father    Hypothyroidism Father    Diabetes Father    Obesity Father    Sleep apnea Father    Cancer Maternal Grandmother        colon   Dementia Maternal Grandmother    Hypertension Maternal Grandmother    Colon cancer Maternal Grandmother    Early death Maternal  Grandfather 41       ?pulmonary disease   Pulmonary fibrosis Maternal Grandfather    Seizures Child        thinks its genetic, only seizes if she hits her head,thinks its migraine related    Migraines Neg Hx     Past Medical History:  Diagnosis Date   Anxiety    Asthma    Bone spur    cervical L side; L side arm numbness intermittent    Depression    Diabetes in pregnancy    Diabetes type 2, controlled (HCC)    Elevated cholesterol with high triglycerides    Gestational diabetes    Hx of varicella    IBS (irritable bowel syndrome)    Kidney stones    followed by Dr. Kathrynn Running   Migraines    without aura   Ovarian cyst    Postpartum care following vaginal delivery (1/30) 07/15/2013   Preterm uterine contractions 06/10/2013   Sleep apnea    Vitamin D deficiency     Patient Active Problem List   Diagnosis Date Noted   BMI 37.0-37.9, adult 10/15/2022   Low serum cortisol level 10/01/2022   Polyphagia 10/01/2022   Other fatigue 09/17/2022   SOB (shortness of breath) on exertion 09/17/2022   Vitamin D deficiency 09/17/2022   Health care maintenance 09/17/2022   BMI 38.0-38.9,adult 09/17/2022   Striae 09/17/2022   Vitamin B 12 deficiency 09/17/2022   Hypertriglyceridemia 09/17/2022   Generalized obesity 07/01/2021   History of menorrhagia 07/01/2021   Fecal smearing 07/01/2021   Anxiety 07/01/2021   Diabetes type 2, controlled (HCC) 06/28/2021   Carpal tunnel syndrome of left wrist 12/26/2018   Chronic migraine without aura without status migrainosus, not intractable 10/09/2018    Past Surgical History:  Procedure Laterality Date   MOUTH SURGERY     wisdom teeth extractions    Current Outpatient Medications  Medication Sig Dispense Refill   albuterol (PROVENTIL HFA;VENTOLIN HFA) 108 (90 BASE) MCG/ACT inhaler Inhale 2 puffs into the lungs every 6 (six) hours as needed for wheezing or shortness of breath.     ALPRAZolam (XANAX) 0.5 MG tablet Take 1 tablet (0.5 mg  total) by mouth 2 (two) times daily as needed for anxiety 30 tablet 3   baclofen (LIORESAL) 10 MG tablet Take 1 tablet (10 mg total) by mouth daily  as needed for muscle spasms (acute migraine onset). 30 tablet 5   citalopram (CELEXA) 40 MG tablet Take 1 tablet (40 mg total) by mouth daily. 90 tablet 3   Erenumab-aooe (AIMOVIG) 140 MG/ML SOAJ Inject 1 mg into the skin every 30 (thirty) days. 1 mL 5   fluticasone furoate-vilanterol (BREO ELLIPTA) 200-25 MCG/ACT AEPB Inhale 1 puff into the lungs once a day. 60 each 3   gabapentin (NEURONTIN) 300 MG capsule Take 1 capsule (300 mg total) by mouth 2 (two) times daily. 180 capsule 0   hydrOXYzine (ATARAX) 25 MG tablet Take 1 tablet (25 mg total) by mouth at bedtime as needed. 30 tablet 1   ibuprofen (ADVIL) 600 MG tablet Take 600 mg by mouth every 6 (six) hours as needed.     indapamide (LOZOL) 1.25 MG tablet TAKE 1 TABLET BY MOUTH ONCE A DAY. 90 tablet 1   MELATONIN PO Take 3 mg by mouth as needed.     metoCLOPramide (REGLAN) 10 MG tablet Take 1 tablet by mouth three times a day while having migraine or nausea. 90 tablet 5   norethindrone (MICRONOR) 0.35 MG tablet Take 1 tablet by mouth daily. 84 tablet 4   ondansetron (ZOFRAN-ODT) 4 MG disintegrating tablet Dissolve 1 tablet by mouth every 6 hours as needed for nausea or for headache. 30 tablet 11   rizatriptan (MAXALT-MLT) 10 MG disintegrating tablet DISSOLVE 1 TABLET BY MOUTH AS NEEDED FOR MIGRAINE, MAY REPEAT IN 2 HOURS IF NEEDED 9 tablet 4   Semaglutide, 1 MG/DOSE, 4 MG/3ML SOPN Inject 1 mg into the skin once a week. 3 mL 0   tiZANidine (ZANAFLEX) 2 MG tablet Take 1 tablet (2 mg total) by mouth every 6 (six) hours as needed for muscle spasms (migraine relief). 90 tablet 3   Current Facility-Administered Medications  Medication Dose Route Frequency Provider Last Rate Last Admin   triamcinolone acetonide (KENALOG-40) injection 10 mg  10 mg Intramuscular Once Marguerita Beards, MD         Allergies as of 03/03/2023 - Review Complete 02/23/2023  Allergen Reaction Noted   Bactrim [sulfamethoxazole-trimethoprim] Hives 11/06/2015   Hydrocodone  11/05/2009   Sulfa antibiotics Hives 02/26/2017   Vicodin [hydrocodone-acetaminophen] Nausea And Vomiting 04/07/2013    Vitals: There were no vitals filed for this visit.   There is no height or weight on file to calculate BMI.  Physical exam: General: well developed, well nourished, very pleasant middle-age Caucasian female, seated, in no evident distress Head: head normocephalic and atraumatic.   Neck: supple with no carotid or supraclavicular bruits Cardiovascular: regular rate and rhythm, no murmurs Musculoskeletal: no deformity Skin:  no rash/petichiae Vascular:  Normal pulses all extremities   Neurologic Exam Mental Status: Awake and fully alert. Oriented to place and time. Recent and remote memory intact. Attention span, concentration and fund of knowledge appropriate. Mood and affect appropriate.  Cranial Nerves: Pupils equal, briskly reactive to light. Extraocular movements full without nystagmus. Visual fields full to confrontation. Hearing intact. Facial sensation intact. Face, tongue, palate moves normally and symmetrically.  Motor: Normal bulk and tone. Normal strength in all tested extremity muscles. Sensory.: intact to touch , pinprick , position and vibratory sensation.  Coordination: Rapid alternating movements normal in all extremities. Finger-to-nose and heel-to-shin performed accurately bilaterally. Gait and Station: Arises from chair without difficulty. Stance is normal. Gait demonstrates normal stride length and balance Reflexes: 1+ and symmetric. Toes downgoing.      Assessment/Plan:  37 year old with  chronic migraines with severe migraine 05/2019 accompanied by slurred speech, right arm heaviness and unsteadiness with symptoms shortly resolving.  Migraines greatly controlled on Aimovig but will occur  3-4 times monthly typically upon awakening   1.  Chronic migraine without aura Preventative: Continue Aimovig 140 mg SQ monthly - refill and copay card provided  Emergent: Rizatriptan as needed and as needed Zofran and baclofen for moderate headaches and Reglan and tizanidine for severe headaches  Tried/failed: Topiramate, Imatrex, Nurtec and Ubrelvy   2.  Moderate OSA 3.  Difficulty tolerating CPAP  Refer to [facility] for now migraines upon awakening and long standing hx of snoring, insomnia and daytime fatigue.  She was agreeable to proceed with sleep evaluation.    Follow-up in 1 year or earlier if needed    CC:  Tisovec, Adelfa Koh, MD     I spent 24 minutes of face-to-face and non-face-to-face time with patient.  This included previsit chart review, lab review, study review, order entry, electronic health record documentation, patient education and discussion regarding migraine headaches, current treatment plan, possible underlying sleep apnea and answered all other questions to patient satisfaction    Ihor Austin, AGNP-BC  [facility] [address] [place], [place] [address]  Phone [phone] Fax [phone] Note: This document was prepared with digital dictation and possible smart phrase technology. Any transcriptional errors that result from this process are unintentional.  agree with assessment and plan as stated.     Naomie Dean, MD [facility]

## 2023-03-03 ENCOUNTER — Ambulatory Visit: Payer: Commercial Managed Care - PPO | Admitting: Adult Health

## 2023-03-04 DIAGNOSIS — L700 Acne vulgaris: Secondary | ICD-10-CM | POA: Diagnosis not present

## 2023-03-04 DIAGNOSIS — L649 Androgenic alopecia, unspecified: Secondary | ICD-10-CM | POA: Diagnosis not present

## 2023-03-04 DIAGNOSIS — L650 Telogen effluvium: Secondary | ICD-10-CM | POA: Diagnosis not present

## 2023-03-09 DIAGNOSIS — M9901 Segmental and somatic dysfunction of cervical region: Secondary | ICD-10-CM | POA: Diagnosis not present

## 2023-03-09 DIAGNOSIS — M25559 Pain in unspecified hip: Secondary | ICD-10-CM | POA: Diagnosis not present

## 2023-03-09 DIAGNOSIS — M9902 Segmental and somatic dysfunction of thoracic region: Secondary | ICD-10-CM | POA: Diagnosis not present

## 2023-03-09 DIAGNOSIS — M9903 Segmental and somatic dysfunction of lumbar region: Secondary | ICD-10-CM | POA: Diagnosis not present

## 2023-03-09 DIAGNOSIS — M7912 Myalgia of auxiliary muscles, head and neck: Secondary | ICD-10-CM | POA: Diagnosis not present

## 2023-03-09 DIAGNOSIS — M25659 Stiffness of unspecified hip, not elsewhere classified: Secondary | ICD-10-CM | POA: Diagnosis not present

## 2023-03-09 DIAGNOSIS — G43009 Migraine without aura, not intractable, without status migrainosus: Secondary | ICD-10-CM | POA: Diagnosis not present

## 2023-03-09 DIAGNOSIS — M9905 Segmental and somatic dysfunction of pelvic region: Secondary | ICD-10-CM | POA: Diagnosis not present

## 2023-03-09 DIAGNOSIS — M7918 Myalgia, other site: Secondary | ICD-10-CM | POA: Diagnosis not present

## 2023-03-26 ENCOUNTER — Other Ambulatory Visit: Payer: Self-pay | Admitting: Adult Health

## 2023-03-26 ENCOUNTER — Other Ambulatory Visit: Payer: Self-pay | Admitting: Nurse Practitioner

## 2023-03-26 ENCOUNTER — Other Ambulatory Visit: Payer: Self-pay

## 2023-03-26 ENCOUNTER — Other Ambulatory Visit (HOSPITAL_COMMUNITY): Payer: Self-pay

## 2023-03-26 DIAGNOSIS — E119 Type 2 diabetes mellitus without complications: Secondary | ICD-10-CM

## 2023-03-26 MED ORDER — ONDANSETRON 4 MG PO TBDP
4.0000 mg | ORAL_TABLET | Freq: Four times a day (QID) | ORAL | 11 refills | Status: DC | PRN
  Filled 2023-03-26: qty 30, 8d supply, fill #0

## 2023-03-27 ENCOUNTER — Other Ambulatory Visit: Payer: Self-pay

## 2023-03-27 ENCOUNTER — Other Ambulatory Visit (HOSPITAL_COMMUNITY): Payer: Self-pay

## 2023-03-27 ENCOUNTER — Other Ambulatory Visit: Payer: Self-pay | Admitting: Nurse Practitioner

## 2023-03-27 DIAGNOSIS — E119 Type 2 diabetes mellitus without complications: Secondary | ICD-10-CM

## 2023-03-27 MED ORDER — FLUTICASONE FUROATE-VILANTEROL 200-25 MCG/ACT IN AEPB
1.0000 | INHALATION_SPRAY | Freq: Every day | RESPIRATORY_TRACT | 3 refills | Status: AC
  Filled 2023-03-27: qty 60, 30d supply, fill #0

## 2023-03-28 DIAGNOSIS — G4733 Obstructive sleep apnea (adult) (pediatric): Secondary | ICD-10-CM | POA: Diagnosis not present

## 2023-03-29 DIAGNOSIS — G4733 Obstructive sleep apnea (adult) (pediatric): Secondary | ICD-10-CM | POA: Diagnosis not present

## 2023-03-30 ENCOUNTER — Other Ambulatory Visit: Payer: Self-pay

## 2023-04-02 ENCOUNTER — Other Ambulatory Visit: Payer: Self-pay

## 2023-04-02 ENCOUNTER — Other Ambulatory Visit (HOSPITAL_BASED_OUTPATIENT_CLINIC_OR_DEPARTMENT_OTHER): Payer: Self-pay

## 2023-04-02 ENCOUNTER — Encounter (HOSPITAL_COMMUNITY): Payer: Self-pay

## 2023-04-02 ENCOUNTER — Ambulatory Visit: Payer: Commercial Managed Care - PPO | Admitting: Nurse Practitioner

## 2023-04-02 ENCOUNTER — Other Ambulatory Visit (HOSPITAL_COMMUNITY): Payer: Self-pay

## 2023-04-02 ENCOUNTER — Encounter: Payer: Self-pay | Admitting: Nurse Practitioner

## 2023-04-02 VITALS — BP 130/77 | HR 82 | Temp 98.3°F | Ht 62.0 in | Wt 202.0 lb

## 2023-04-02 DIAGNOSIS — E669 Obesity, unspecified: Secondary | ICD-10-CM

## 2023-04-02 DIAGNOSIS — Z6836 Body mass index (BMI) 36.0-36.9, adult: Secondary | ICD-10-CM | POA: Diagnosis not present

## 2023-04-02 DIAGNOSIS — E119 Type 2 diabetes mellitus without complications: Secondary | ICD-10-CM | POA: Diagnosis not present

## 2023-04-02 DIAGNOSIS — Z7985 Long-term (current) use of injectable non-insulin antidiabetic drugs: Secondary | ICD-10-CM | POA: Diagnosis not present

## 2023-04-02 MED ORDER — OZEMPIC (0.25 OR 0.5 MG/DOSE) 2 MG/3ML ~~LOC~~ SOPN
0.2500 mg | PEN_INJECTOR | SUBCUTANEOUS | 0 refills | Status: DC
Start: 2023-04-02 — End: 2023-05-25
  Filled 2023-04-02 – 2023-04-16 (×2): qty 3, 56d supply, fill #0

## 2023-04-02 NOTE — Progress Notes (Signed)
Office: [phone]  /  Fax: [phone]  WEIGHT SUMMARY AND BIOMETRICS  Weight Lost Since Last Visit: 0lb  Weight Gained Since Last Visit: 3lb   Vitals Temp: 98.3 F (36.8 C) BP: 130/77 Pulse Rate: 82 SpO2: 95 %   Anthropometric Measurements Height: 5\' 2"  (1.575 m) Weight: 202 lb (91.6 kg) BMI (Calculated): 36.94 Weight at Last Visit: 199lb Weight Lost Since Last Visit: 0lb Weight Gained Since Last Visit: 3lb Starting Weight: 208lb Total Weight Loss (lbs): 6 lb (2.722 kg)   Body Composition  Body Fat %: 44.9 % Fat Mass (lbs): 90.8 lbs Muscle Mass (lbs): 105.8 lbs Total Body Water (lbs): 78.4 lbs Visceral Fat Rating : 11   Other Clinical Data Fasting: No Labs: No Today's Visit #: 9 Starting Date: 09/17/22     HPI  Chief Complaint: OBESITY  Latricia is here to discuss her progress with her obesity treatment plan. She is on the the Category 2 Plan and states she is following her eating plan approximately 40-50 % of the time. She states she is exercising 30 minutes 1-2 days per week.   Interval History:  Since last office visit she has gained 3 pounds. She has been under stress since her last visit.  Her father and daughter were both in the hospital since her last visit. She had Covid and her father moved in with her.  She has been stress eating.     Pharmacotherapy for weight loss: She is not currently taking medications  for medical weight loss.     Previous pharmacotherapy for medical weight loss:  None  Bariatric surgery:  Patient has not had bariatric surgery  Pharmacotherapy for DMT2:  She is not currently taking Ozempic (missed her last appt)-took last in Sept-5weeks ago.     Last A1c was 6.2 in Sept at [facility] office per patient's report.  She is not checking BS at home.   Episodes of hypoglycemia: no Not on ACE or ARB, ASA 81mg  or statin.  Last eye exam:  Sept 2023-appt scheduled next week She has tried Ozempic and Glyburide in the past.     Lab Results  Component Value Date   HGBA1C 6.2 (H) 10/31/2019   Lab Results  Component Value Date   LDLCALC 104 (H) 09/17/2022   CREATININE 0.74 12/03/2022     PHYSICAL EXAM:  Blood pressure 130/77, pulse 82, temperature 98.3 F (36.8 C), height 5\' 2"  (1.575 m), weight 202 lb (91.6 kg), last menstrual period 03/17/2023, SpO2 95%. Body mass index is 36.95 kg/m.  General: She is overweight, cooperative, alert, well developed, and in no acute distress. PSYCH: Has normal mood, affect and thought process.   Extremities: No edema.  Neurologic: No gross sensory or motor deficits. No tremors or fasciculations noted.    DIAGNOSTIC DATA REVIEWED:  BMET    Component Value Date/Time   NA 139 12/03/2022 1454   K 4.2 12/03/2022 1454   CL 99 12/03/2022 1454   CO2 24 12/03/2022 1454   GLUCOSE 90 12/03/2022 1454   GLUCOSE 151 (H) 06/16/2017 2322   BUN 16 12/03/2022 1454   CREATININE 0.74 12/03/2022 1454   CALCIUM 10.2 12/03/2022 1454   GFRNONAA 115 10/31/2019 1518   GFRAA 132 10/31/2019 1518   Lab Results  Component Value Date   HGBA1C 6.2 (H) 10/31/2019   Lab Results  Component Value Date   INSULIN 21.5 09/17/2022   Lab Results  Component Value Date   TSH 2.280 09/17/2022   CBC  Component Value Date/Time   WBC 10.9 (H) 12/03/2022 1454   WBC 8.5 07/14/2017 1209   RBC 5.03 12/03/2022 1454   RBC 5.12 (H) 07/14/2017 1209   HGB 13.5 12/03/2022 1454   HCT 42.3 12/03/2022 1454   PLT 325 12/03/2022 1454   MCV 84 12/03/2022 1454   MCH 26.8 12/03/2022 1454   MCH 27.9 06/16/2017 2322   MCHC 31.9 12/03/2022 1454   MCHC 33.6 07/14/2017 1209   RDW 13.2 12/03/2022 1454   Iron Studies    Component Value Date/Time   IRON 40 10/31/2019 1518   TIBC 333 10/31/2019 1518   FERRITIN 60 10/31/2019 1518   IRONPCTSAT 12 (L) 10/31/2019 1518   Lipid Panel     Component Value Date/Time   CHOL 181 09/17/2022 1042   TRIG 213 (H) 09/17/2022 1042   HDL 40 09/17/2022 1042    LDLCALC 104 (H) 09/17/2022 1042   Hepatic Function Panel     Component Value Date/Time   PROT 7.1 12/03/2022 1454   ALBUMIN 4.6 12/03/2022 1454   AST 15 12/03/2022 1454   ALT 15 12/03/2022 1454   ALKPHOS 90 12/03/2022 1454   BILITOT <0.2 12/03/2022 1454      Component Value Date/Time   TSH 2.280 09/17/2022 1042   Nutritional No results found for: "VD25OH"   ASSESSMENT AND PLAN  TREATMENT PLAN FOR OBESITY:  Recommended Dietary Goals  Rojean is currently in the action stage of change. As such, her goal is to continue weight management plan. She has agreed to the Category 2 Plan.  Behavioral Intervention  We discussed the following Behavioral Modification Strategies today: continue to work on maintaining a reduced calorie state, getting the recommended amount of protein, incorporating whole foods, making healthy choices, staying well hydrated and practicing mindfulness when eating..  Additional resources provided today: NA  Recommended Physical Activity Goals  Gordana has been advised to work up to 150 minutes of moderate intensity aerobic activity a week and strengthening exercises 2-3 times per week for cardiovascular health, weight loss maintenance and preservation of muscle mass.   She has agreed to Continue current level of physical activity , Think about enjoyable ways to increase daily physical activity and overcoming barriers to exercise, and Increase physical activity in their day and reduce sedentary time (increase NEAT).   ASSOCIATED CONDITIONS ADDRESSED TODAY  Action/Plan  Controlled type 2 diabetes mellitus without complication, without long-term current use of insulin (HCC) -     Restart Ozempic (0.25 or 0.5 MG/DOSE); Inject 0.25 mg into the skin once a week.  Dispense: 3 mL; Refill: 0.  Side effects discussed.    Generalized obesity  BMI 36.0-36.9,adult      To send me labs via mychart-had labs obtained at [facility] office in Sept.    Return in  about 4 weeks (around 04/30/2023).Marland Kitchen She was informed of the importance of frequent follow up visits to maximize her success with intensive lifestyle modifications for her multiple health conditions.   ATTESTASTION STATEMENTS:  Reviewed by clinician on day of visit: allergies, medications, problem list, medical history, surgical history, family history, social history, and previous encounter notes.     Theodis Sato. Mavis Fichera FNP-C

## 2023-04-03 ENCOUNTER — Other Ambulatory Visit: Payer: Self-pay

## 2023-04-07 DIAGNOSIS — E119 Type 2 diabetes mellitus without complications: Secondary | ICD-10-CM | POA: Diagnosis not present

## 2023-04-08 ENCOUNTER — Encounter: Payer: Self-pay | Admitting: Nurse Practitioner

## 2023-04-15 NOTE — Progress Notes (Unsigned)
[facility]    Provider:  Dr Lucia Gaskins Requesting Provider: Tisovec, Adelfa Koh, MD Primary Care Provider:  Gaspar Garbe, MD  CC:  No chief complaint on file.     HPI:  Update 04/15/2023 JM: Patient returns for follow-up visit previously seen by Amy, NP in 10/2022.  Previously, she was diagnosed with moderate OSA and placed on CPAP therapy but noted difficulty tolerating CPAP resulting in suboptimal compliance, recommended mask refitting.  Migraines remain well-controlled on current regimen.  Currently, ***  Remains on Aimovig monthly injection and gabapentin 300mg  BID for prevention Rizatriptan for rescue Baclofen and Zofran ***  Being followed by [facility] for weight loss assistance on Ozempic      Update 10/24/2021 JM: Patient returns for 1 year migraine follow-up.  Overall doing well.  Migraines remain well controlled on Aimovig. She has been experiencing a migraine headache about 1x/week typically upon awakening over the past 6 months.  Previously, would experience about 1 migraine per week but randomly throughout the day.  Pain remains behind right eye and right occipital area.  Will take a rizatriptan and fall asleep for another hour and typically migraine will be resolved.  If severe, will take Zofran and baclofen in addition to rizatriptan with resolution.  Upon further questioning, she does admit to insomnia waking up frequently throughout the night and difficulty falling asleep, fatigue during the day with naps, and snores.  She has not previously underwent sleep study.  She is currently working on weight loss and has lost about 30 pounds since prior visit.  No further concerns at this time.  Update 11/05/2020 JM: [name] returns for yearly migraine follow-up  Migraines have been well controlled with monthly use of Aimovig.  She has only been experiencing migraines during her monthly cycle and with increased neck pain.  Reports  her PCP recently referred her to [facility] for a bone spur at C2-3 and possible nerve ablation per patient-initial evaluation next week.  Use of Maxalt with some benefit but usually will take up to 2 hours to take effect.  Trialed Nurtec with quick resolution when able to catch early enough. Will also use Zofran and baclofen with benefit.  Will occasionally use tizadine and Reglan but does cause excessive fatigue.   Update 11/03/2019 JM: [name] returns for migraine follow-up.  Current migraine treatment plan includes  Prevention: Aimovig 140 mg SQ monthly, Emergent as needed: ubrelvy 50 mg as needed and baclofen; if severe unable to sleep, rizatriptan 10 mg and tizanidine 4 mg.  Zofran 4 mg as needed for nausea  Reports severe migraine in 05/2019 with typical migraine onset and location but accompanied by slurred speech, right arm heaviness and unsteadiness.  She has not previously experienced the symptoms.  Pain rated 10/10.  She ended up falling asleep after taking rizatriptan and tizanidine and after awakening 4 hours later, symptoms resolved including migraine but did continue to feel spaced out sensation which is typical for her.  She has not experienced recurrent neurological symptoms since that time.  During the month of March and April, she was experiencing migraines 3-4 times weekly lasting 4 to 6 hours without benefit of emergent medications.  She does admit to missing Aimovig injections in January and February due to home stressors.  Restarted monthly Aimovig in March and migraines have greatly improved since that time.  She does endorse ongoing bilateral neck pain and feels as though this contributes to migraine onset.  She has trialed massage in the past without great benefit.   Interval history 11/24/2018 Dr. Lucia Gaskins:  Follow up for migraines and new symptom hand pain.  Patient reports for several years she has had hand pain numbness and tingling mostly in digits 1  through 3 more so on the left but also on the right.  She wakes up in the middle of the night and has to shake her hands out.  She had an MRI of the cervical spine in 2017 due to the symptoms but did not show etiology.  It is worsening.  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  Positive Phalen's sign.  I discussed carpal tunnel syndrome with patient and we will have her in for an EMG nerve conduction study.  She is doing amazing on Aimovig she loves it continue.  Personally reviewed MRI of th cervical spine and agree with the following 2017:   IMPRESSION: Negative for central canal stenosis. Uncovertebral spurring results in some foraminal narrowing on the left appearing most notable at C3-4 with a very mild degree of foraminal narrowing on the left at C4-5 and C5-6.   Initial consult visit 10/07/2018 Dr. Lucia Gaskins:  Abigail Frank I Schumpert is a 37 y.o. female here as requested by Tisovec, Adelfa Koh, MD for migraines. PMHx migraine. No family history that she knows of, her mother is deceased. Struggling for years. Started during her menses as a teenager and have worsened over the years. Worse with life stressors. At least 2-3 migraines. She has 25 headache days a month. At least 12 migraine days a month that can be moderately severe or severe. Pounding/pulsating, interrupts her daily life, she has to lay down, turn off the light, nausea, no vomiting, +photo/phonophbia, Can last 24-72 hours. No medication overuse. No aura. Ongoing for over a year. Migraines starts as tension in the neck and creep up the back of her neck, pins and needles, to the right and then all over, pain behind the right eye, right side is the worse side. They can be positional. She wakes with them in the morning. Her left arm goes numb, tried a steroid pack. She follows with an orthopaedist for her neck. Positions of her head can cause the headaches. Neck pain and tightness. No other focal neurologic deficits, associated symptoms, inciting  events or modifiable factors.  Meds tried: zoloft. Topamax(made her "crazy"), ubrelvy, baclofen, imitrex..      Review of Systems: Patient complains of symptoms per HPI as well as the following symptoms: Headaches.  Pertinent negatives and positives per HPI. All others negative.   Social History   Socioeconomic History   Marital status: Married    Spouse name: Not on file   Number of children: 2   Years of education: Not on file   Highest education level: Bachelor's degree (e.g., BA, AB, BS)  Occupational History   Not on file  Tobacco Use   Smoking status: Never   Smokeless tobacco: Never  Vaping Use   Vaping status: Never Used  Substance and Sexual Activity   Alcohol use: Yes    Comment: on occasion a glass of wine   Drug use: Never   Sexual activity: Yes    Birth control/protection: OCP  Other Topics Concern   Not on file  Social History Narrative   Lives at home with her family   Left handed   Caffeine: regular, at least 2 cups of coffee daily   Social Determinants of Health  Financial Resource Strain: Not on file  Food Insecurity: Not on file  Transportation Needs: Not on file  Physical Activity: Not on file  Stress: Not on file  Social Connections: Not on file  Intimate Partner Violence: Not on file    Family History  Problem Relation Age of Onset   Hypertension Mother    Hypothyroidism Mother    Heart attack Mother    Diabetes Mother    Heart disease Mother        heart failure   Dementia Mother    Kidney disease Mother    Sleep apnea Mother    Hypertension Father    Hypothyroidism Father    Diabetes Father    Obesity Father    Sleep apnea Father    Cancer Maternal Grandmother        colon   Dementia Maternal Grandmother    Hypertension Maternal Grandmother    Colon cancer Maternal Grandmother    Early death Maternal Grandfather 29       ?pulmonary disease   Pulmonary fibrosis Maternal Grandfather    Seizures Child        thinks its  genetic, only seizes if she hits her head,thinks its migraine related    Migraines Neg Hx     Past Medical History:  Diagnosis Date   Anxiety    Asthma    Bone spur    cervical L side; L side arm numbness intermittent    Depression    Diabetes in pregnancy    Diabetes type 2, controlled (HCC)    Elevated cholesterol with high triglycerides    Gestational diabetes    Hx of varicella    IBS (irritable bowel syndrome)    Kidney stones    followed by Dr. Kathrynn Running   Migraines    without aura   Ovarian cyst    Postpartum care following vaginal delivery (1/30) 07/15/2013   Preterm uterine contractions 06/10/2013   Sleep apnea    Vitamin D deficiency     Patient Active Problem List   Diagnosis Date Noted   BMI 37.0-37.9, adult 10/15/2022   Low serum cortisol level 10/01/2022   Polyphagia 10/01/2022   Other fatigue 09/17/2022   SOB (shortness of breath) on exertion 09/17/2022   Vitamin D deficiency 09/17/2022   Health care maintenance 09/17/2022   BMI 38.0-38.9,adult 09/17/2022   Striae 09/17/2022   Vitamin B 12 deficiency 09/17/2022   Hypertriglyceridemia 09/17/2022   Generalized obesity 07/01/2021   History of menorrhagia 07/01/2021   Fecal smearing 07/01/2021   Anxiety 07/01/2021   Diabetes type 2, controlled (HCC) 06/28/2021   Carpal tunnel syndrome of left wrist 12/26/2018   Chronic migraine without aura without status migrainosus, not intractable 10/09/2018    Past Surgical History:  Procedure Laterality Date   MOUTH SURGERY     wisdom teeth extractions    Current Outpatient Medications  Medication Sig Dispense Refill   albuterol (PROVENTIL HFA;VENTOLIN HFA) 108 (90 BASE) MCG/ACT inhaler Inhale 2 puffs into the lungs every 6 (six) hours as needed for wheezing or shortness of breath.     ALPRAZolam (XANAX) 0.5 MG tablet Take 1 tablet (0.5 mg total) by mouth 2 (two) times daily as needed for anxiety 30 tablet 3   baclofen (LIORESAL) 10 MG tablet Take 1 tablet (10  mg total) by mouth daily as needed for muscle spasms (acute migraine onset). 30 tablet 5   citalopram (CELEXA) 40 MG tablet Take 1 tablet (40 mg total) by mouth  daily. 90 tablet 3   Erenumab-aooe (AIMOVIG) 140 MG/ML SOAJ Inject 1 mg into the skin every 30 (thirty) days. 1 mL 5   fluticasone furoate-vilanterol (BREO ELLIPTA) 200-25 MCG/ACT AEPB Inhale 1 puff into the lungs daily. 60 each 3   gabapentin (NEURONTIN) 300 MG capsule Take 1 capsule (300 mg total) by mouth 2 (two) times daily. 180 capsule 0   hydrOXYzine (ATARAX) 25 MG tablet Take 1 tablet (25 mg total) by mouth at bedtime as needed. 30 tablet 1   ibuprofen (ADVIL) 600 MG tablet Take 600 mg by mouth every 6 (six) hours as needed.     indapamide (LOZOL) 1.25 MG tablet TAKE 1 TABLET BY MOUTH ONCE A DAY. 90 tablet 1   MELATONIN PO Take 3 mg by mouth as needed.     metoCLOPramide (REGLAN) 10 MG tablet Take 1 tablet by mouth three times a day while having migraine or nausea. 90 tablet 5   norethindrone (MICRONOR) 0.35 MG tablet Take 1 tablet by mouth daily. 84 tablet 4   ondansetron (ZOFRAN-ODT) 4 MG disintegrating tablet Dissolve 1 tablet by mouth every 6 hours as needed for nausea or for headache. 30 tablet 11   rizatriptan (MAXALT-MLT) 10 MG disintegrating tablet DISSOLVE 1 TABLET BY MOUTH AS NEEDED FOR MIGRAINE, MAY REPEAT IN 2 HOURS IF NEEDED 9 tablet 4   Semaglutide,0.25 or 0.5MG /DOS, (OZEMPIC, 0.25 OR 0.5 MG/DOSE,) 2 MG/3ML SOPN Inject 0.25 mg into the skin once a week. 3 mL 0   tiZANidine (ZANAFLEX) 2 MG tablet Take 1 tablet (2 mg total) by mouth every 6 (six) hours as needed for muscle spasms (migraine relief). (Patient not taking: Reported on 04/02/2023) 90 tablet 3   Current Facility-Administered Medications  Medication Dose Route Frequency Provider Last Rate Last Admin   triamcinolone acetonide (KENALOG-40) injection 10 mg  10 mg Intramuscular Once Marguerita Beards, MD        Allergies as of 04/16/2023 - Review Complete  04/02/2023  Allergen Reaction Noted   Bactrim [sulfamethoxazole-trimethoprim] Hives 11/06/2015   Hydrocodone  11/05/2009   Sulfa antibiotics Hives 02/26/2017   Vicodin [hydrocodone-acetaminophen] Nausea And Vomiting 04/07/2013    Vitals: There were no vitals filed for this visit.   There is no height or weight on file to calculate BMI.  Physical exam: General: well developed, well nourished, very pleasant middle-age Caucasian female, seated, in no evident distress Head: head normocephalic and atraumatic.   Neck: supple with no carotid or supraclavicular bruits Cardiovascular: regular rate and rhythm, no murmurs Musculoskeletal: no deformity Skin:  no rash/petichiae Vascular:  Normal pulses all extremities   Neurologic Exam Mental Status: Awake and fully alert. Oriented to place and time. Recent and remote memory intact. Attention span, concentration and fund of knowledge appropriate. Mood and affect appropriate.  Cranial Nerves: Pupils equal, briskly reactive to light. Extraocular movements full without nystagmus. Visual fields full to confrontation. Hearing intact. Facial sensation intact. Face, tongue, palate moves normally and symmetrically.  Motor: Normal bulk and tone. Normal strength in all tested extremity muscles. Sensory.: intact to touch , pinprick , position and vibratory sensation.  Coordination: Rapid alternating movements normal in all extremities. Finger-to-nose and heel-to-shin performed accurately bilaterally. Gait and Station: Arises from chair without difficulty. Stance is normal. Gait demonstrates normal stride length and balance Reflexes: 1+ and symmetric. Toes downgoing.      Assessment/Plan:  37 year old with chronic migraines with severe migraine 05/2019 accompanied by slurred speech, right arm heaviness and unsteadiness with symptoms  shortly resolving.  Migraines greatly controlled on Aimovig but will occur 3-4 times monthly typically upon awakening   1.   Chronic migraine without aura Preventative: Continue Aimovig 140 mg SQ monthly - refill and copay card provided  Emergent: Rizatriptan as needed and as needed Zofran and baclofen for moderate headaches and Reglan and tizanidine for severe headaches  Tried/failed: Topiramate, Imatrex, Nurtec and Ubrelvy   2.  Questionable sleep apnea Refer to [facility] clinic for now migraines upon awakening and long standing hx of snoring, insomnia and daytime fatigue.  She was agreeable to proceed with sleep evaluation.    Follow-up in 1 year or earlier if needed    CC:  Tisovec, Adelfa Koh, MD     I spent 24 minutes of face-to-face and non-face-to-face time with patient.  This included previsit chart review, lab review, study review, order entry, electronic health record documentation, patient education and discussion regarding migraine headaches, current treatment plan, possible underlying sleep apnea and answered all other questions to patient satisfaction    Ihor Austin, AGNP-BC  [facility] [address] [place], [place] [address]  Phone [phone] Fax [phone] Note: This document was prepared with digital dictation and possible smart phrase technology. Any transcriptional errors that result from this process are unintentional.  agree with assessment and plan as stated.     Naomie Dean, MD [facility]

## 2023-04-16 ENCOUNTER — Other Ambulatory Visit (HOSPITAL_COMMUNITY): Payer: Self-pay

## 2023-04-16 ENCOUNTER — Encounter: Payer: Self-pay | Admitting: Adult Health

## 2023-04-16 ENCOUNTER — Ambulatory Visit: Payer: Commercial Managed Care - PPO | Admitting: Adult Health

## 2023-04-16 ENCOUNTER — Other Ambulatory Visit: Payer: Self-pay

## 2023-04-16 VITALS — BP 130/65 | HR 74 | Ht 62.5 in | Wt 204.0 lb

## 2023-04-16 DIAGNOSIS — G4733 Obstructive sleep apnea (adult) (pediatric): Secondary | ICD-10-CM

## 2023-04-16 DIAGNOSIS — G43709 Chronic migraine without aura, not intractable, without status migrainosus: Secondary | ICD-10-CM | POA: Diagnosis not present

## 2023-04-16 MED ORDER — AIMOVIG 140 MG/ML ~~LOC~~ SOAJ
140.0000 mg | SUBCUTANEOUS | 11 refills | Status: DC
  Filled 2023-04-16 – 2023-08-13 (×2): qty 1, 30d supply, fill #0
  Filled 2023-09-09: qty 1, 30d supply, fill #1
  Filled 2023-09-11 – 2023-10-27 (×2): qty 1, 30d supply, fill #2
  Filled 2023-12-14: qty 1, 30d supply, fill #3
  Filled 2024-02-03 – 2024-03-04 (×2): qty 1, 30d supply, fill #4
  Filled 2024-04-01: qty 1, 30d supply, fill #5
  Filled 2024-04-11 – 2024-04-14 (×2): qty 1, 30d supply, fill #6

## 2023-04-16 MED ORDER — RIZATRIPTAN BENZOATE 10 MG PO TBDP
10.0000 mg | ORAL_TABLET | ORAL | 11 refills | Status: DC | PRN
  Filled 2023-04-16: qty 9, 30d supply, fill #0
  Filled 2023-08-13: qty 9, 30d supply, fill #1
  Filled 2023-09-11: qty 9, 30d supply, fill #2
  Filled 2023-10-27: qty 9, 30d supply, fill #3
  Filled 2023-12-14 – 2023-12-23 (×2): qty 9, 30d supply, fill #4
  Filled 2024-02-03: qty 9, 30d supply, fill #5
  Filled 2024-03-04: qty 9, 30d supply, fill #6
  Filled 2024-04-01: qty 9, 30d supply, fill #7

## 2023-04-16 MED ORDER — BACLOFEN 10 MG PO TABS
10.0000 mg | ORAL_TABLET | Freq: Every day | ORAL | 11 refills | Status: DC | PRN
  Filled 2023-04-16 – 2023-06-01 (×2): qty 30, 30d supply, fill #0
  Filled 2023-07-19: qty 30, 30d supply, fill #1
  Filled 2023-09-11: qty 30, 30d supply, fill #2
  Filled 2023-10-27: qty 30, 30d supply, fill #3
  Filled 2023-12-14 – 2023-12-23 (×2): qty 30, 30d supply, fill #4
  Filled 2024-02-03: qty 30, 30d supply, fill #5
  Filled 2024-03-04: qty 30, 30d supply, fill #6
  Filled 2024-04-01: qty 30, 30d supply, fill #7

## 2023-04-16 MED ORDER — GABAPENTIN 300 MG PO CAPS
300.0000 mg | ORAL_CAPSULE | Freq: Two times a day (BID) | ORAL | 3 refills | Status: DC
  Filled 2023-04-16: qty 180, 90d supply, fill #0
  Filled 2024-03-04: qty 180, 90d supply, fill #1
  Filled 2024-04-01: qty 180, 90d supply, fill #2

## 2023-04-16 NOTE — Patient Instructions (Addendum)
Your Plan:  Continue Aimovig and gabapentin for migraine prevention  Continue rizatriptan, baclofen, gabapentin and Zofran as needed  Continue to try to use CPAP nightly, continue to follow with your DME for any needed supplies or CPAP related concerns      Follow up in 1 year or call earlier if needed     Thank you for coming to see [place] at [facility]. I hope we have been able to provide you high quality care today.  You may receive a patient satisfaction survey over the next few weeks. We would appreciate your feedback and comments so that we may continue to improve ourselves and the health of our patients.

## 2023-04-22 ENCOUNTER — Other Ambulatory Visit (HOSPITAL_COMMUNITY): Payer: Self-pay

## 2023-04-22 ENCOUNTER — Other Ambulatory Visit: Payer: Self-pay

## 2023-04-23 ENCOUNTER — Other Ambulatory Visit (HOSPITAL_COMMUNITY): Payer: Self-pay

## 2023-04-26 DIAGNOSIS — G4733 Obstructive sleep apnea (adult) (pediatric): Secondary | ICD-10-CM | POA: Diagnosis not present

## 2023-04-30 ENCOUNTER — Encounter: Payer: Self-pay | Admitting: Nurse Practitioner

## 2023-04-30 ENCOUNTER — Ambulatory Visit: Payer: Commercial Managed Care - PPO | Admitting: Nurse Practitioner

## 2023-04-30 VITALS — BP 104/73 | HR 73 | Temp 98.2°F | Ht 62.0 in | Wt 200.0 lb

## 2023-04-30 DIAGNOSIS — Z8249 Family history of ischemic heart disease and other diseases of the circulatory system: Secondary | ICD-10-CM

## 2023-04-30 DIAGNOSIS — R600 Localized edema: Secondary | ICD-10-CM | POA: Diagnosis not present

## 2023-04-30 DIAGNOSIS — Z7985 Long-term (current) use of injectable non-insulin antidiabetic drugs: Secondary | ICD-10-CM

## 2023-04-30 DIAGNOSIS — E669 Obesity, unspecified: Secondary | ICD-10-CM | POA: Diagnosis not present

## 2023-04-30 DIAGNOSIS — Z6836 Body mass index (BMI) 36.0-36.9, adult: Secondary | ICD-10-CM

## 2023-04-30 DIAGNOSIS — E119 Type 2 diabetes mellitus without complications: Secondary | ICD-10-CM

## 2023-04-30 NOTE — Progress Notes (Signed)
Office: [phone]  /  Fax: [phone]  WEIGHT SUMMARY AND BIOMETRICS  Weight Lost Since Last Visit: 2lb  Weight Gained Since Last Visit: 0lb   Vitals Temp: 98.2 F (36.8 C) BP: 104/73 Pulse Rate: 73 SpO2: 98 %   Anthropometric Measurements Height: 5\' 2"  (1.575 m) Weight: 200 lb (90.7 kg) BMI (Calculated): 36.57 Weight at Last Visit: 202lb Weight Lost Since Last Visit: 2lb Weight Gained Since Last Visit: 0lb Starting Weight: 208lb Total Weight Loss (lbs): 8 lb (3.629 kg)   Body Composition  Body Fat %: 44.3 % Fat Mass (lbs): 88.8 lbs Muscle Mass (lbs): 106 lbs Total Body Water (lbs): 78 lbs Visceral Fat Rating : 10   Other Clinical Data Fasting: No Labs: No Today's Visit #: 10 Starting Date: 09/17/22     HPI  Chief Complaint: OBESITY  Abigail Frank is here to discuss her progress with her obesity treatment plan. She is on the the Category 2 Plan and states she is following her eating plan approximately 80 % of the time. She states she is exercising 30 minutes 1-2 days per week.   Interval History:  Since last office visit she has lost 2 pounds.    Pharmacotherapy for weight loss: She is not currently taking medications  for medical weight loss.      Previous pharmacotherapy for medical weight loss:  None   Bariatric surgery:  Patient has not had bariatric surgery  [name] Has been struggling with [name] and abd off and on for the past year.  She is taking Lozol 1.25mg  once monthly.  She noted over the past week that she was gaining 2 lbs daily for 3 days.  Her weight increased to 208lbs.  She took Lozol 1.25mg  on  04/27/23 and noted a decrease in edema and weight in 24 hours.  She continued to diurese over 2 days. Her mother was diagnosed in her 37s with CHF and passed away in 31 in her 49s with an EF <10.  Her father has a history of a fib, mgm with a fib and CHF and she reports "my mother's entire family has/had heart disease".  She reports mild  palpitations and dizziness once per week.  Denies SHOB, DOE or CP.  Pharmacotherapy for DMT2:  She is currently taking Ozempic 0.5mg  (x 1 dose).  Denies side effects.   Last A1c was 6.2 She is not checking BS at home.   Episodes of hypoglycemia: no Not on ACE or ARB, ASA 81mg  and statin.  Last eye exam:  Oct 22nd, 2024 She has tried Ozempic and Glyburide in the past.    Lab Results  Component Value Date   HGBA1C 6.2 (H) 10/31/2019   Lab Results  Component Value Date   LDLCALC 104 (H) 09/17/2022   CREATININE 0.74 12/03/2022       PHYSICAL EXAM:  Blood pressure 104/73, pulse 73, temperature 98.2 F (36.8 C), height 5\' 2"  (1.575 m), weight 200 lb (90.7 kg), last menstrual period 04/27/2023, SpO2 98%. Body mass index is 36.58 kg/m.  General: She is overweight, cooperative, alert, well developed, and in no acute distress. PSYCH: Has normal mood, affect and thought process.   Extremities: No edema.  Neurologic: No gross sensory or motor deficits. No tremors or fasciculations noted.    DIAGNOSTIC DATA REVIEWED:  BMET    Component Value Date/Time   NA 139 12/03/2022 1454   K 4.2 12/03/2022 1454   CL 99 12/03/2022 1454   CO2 24 12/03/2022 1454  GLUCOSE 90 12/03/2022 1454   GLUCOSE 151 (H) 06/16/2017 2322   BUN 16 12/03/2022 1454   CREATININE 0.74 12/03/2022 1454   CALCIUM 10.2 12/03/2022 1454   GFRNONAA 115 10/31/2019 1518   GFRAA 132 10/31/2019 1518   Lab Results  Component Value Date   HGBA1C 6.2 (H) 10/31/2019   Lab Results  Component Value Date   INSULIN 21.5 09/17/2022   Lab Results  Component Value Date   TSH 2.280 09/17/2022   CBC    Component Value Date/Time   WBC 10.9 (H) 12/03/2022 1454   WBC 8.5 07/14/2017 1209   RBC 5.03 12/03/2022 1454   RBC 5.12 (H) 07/14/2017 1209   HGB 13.5 12/03/2022 1454   HCT 42.3 12/03/2022 1454   PLT 325 12/03/2022 1454   MCV 84 12/03/2022 1454   MCH 26.8 12/03/2022 1454   MCH 27.9 06/16/2017 2322   MCHC 31.9  12/03/2022 1454   MCHC 33.6 07/14/2017 1209   RDW 13.2 12/03/2022 1454   Iron Studies    Component Value Date/Time   IRON 40 10/31/2019 1518   TIBC 333 10/31/2019 1518   FERRITIN 60 10/31/2019 1518   IRONPCTSAT 12 (L) 10/31/2019 1518   Lipid Panel     Component Value Date/Time   CHOL 181 09/17/2022 1042   TRIG 213 (H) 09/17/2022 1042   HDL 40 09/17/2022 1042   LDLCALC 104 (H) 09/17/2022 1042   Hepatic Function Panel     Component Value Date/Time   PROT 7.1 12/03/2022 1454   ALBUMIN 4.6 12/03/2022 1454   AST 15 12/03/2022 1454   ALT 15 12/03/2022 1454   ALKPHOS 90 12/03/2022 1454   BILITOT <0.2 12/03/2022 1454      Component Value Date/Time   TSH 2.280 09/17/2022 1042   Nutritional No results found for: "VD25OH"   ASSESSMENT AND PLAN  TREATMENT PLAN FOR OBESITY:  Recommended Dietary Goals  [name] is currently in the action stage of change. As such, her goal is to continue weight management plan. She has agreed to the Category 2 Plan.  Behavioral Intervention  We discussed the following Behavioral Modification Strategies today: increasing lean protein intake to established goals, increasing water intake , work on meal planning and preparation, reading food labels , keeping healthy foods at home, staying on track while traveling and vacationing, and continue to work on maintaining a reduced calorie state, getting the recommended amount of protein, incorporating whole foods, making healthy choices, staying well hydrated and practicing mindfulness when eating..  Additional resources provided today: NA  Recommended Physical Activity Goals  [name] has been advised to work up to 150 minutes of moderate intensity aerobic activity a week and strengthening exercises 2-3 times per week for cardiovascular health, weight loss maintenance and preservation of muscle mass.   She has agreed to Think about enjoyable ways to increase daily physical activity and overcoming  barriers to exercise, Increase physical activity in their day and reduce sedentary time (increase NEAT)., Increase the intensity, frequency or duration of strengthening exercises , and Increase the intensity, frequency or duration of aerobic exercises     ASSOCIATED CONDITIONS ADDRESSED TODAY  Action/Plan  Controlled type 2 diabetes mellitus without complication, without long-term current use of insulin (HCC) Continue Ozempic 0.5mg .  Doing well.  To bring me labs from PCP.    Lower extremity edema -     Ambulatory referral to Cardiology  Family history of CHF (congestive heart failure) -     Ambulatory referral to Cardiology  Generalized obesity -     Ambulatory referral to Cardiology  BMI 36.0-36.9,adult -     Ambulatory referral to Cardiology       Will obtain labs in December   Return in about 4 weeks (around 05/28/2023).Marland Kitchen She was informed of the importance of frequent follow up visits to maximize her success with intensive lifestyle modifications for her multiple health conditions.   ATTESTASTION STATEMENTS:  Reviewed by clinician on day of visit: allergies, medications, problem list, medical history, surgical history, family history, social history, and previous encounter notes.   Time spent on visit including pre-visit chart review and post-visit care and charting was 30 minutes.    Theodis Sato. Shiquan Mathieu FNP-C

## 2023-05-05 ENCOUNTER — Other Ambulatory Visit (HOSPITAL_COMMUNITY): Payer: Self-pay

## 2023-05-08 ENCOUNTER — Other Ambulatory Visit: Payer: Self-pay

## 2023-05-15 ENCOUNTER — Other Ambulatory Visit: Payer: Self-pay

## 2023-05-18 ENCOUNTER — Encounter: Payer: Self-pay | Admitting: Nurse Practitioner

## 2023-05-25 ENCOUNTER — Other Ambulatory Visit (HOSPITAL_COMMUNITY): Payer: Self-pay

## 2023-05-25 ENCOUNTER — Other Ambulatory Visit: Payer: Self-pay

## 2023-05-25 ENCOUNTER — Encounter: Payer: Self-pay | Admitting: Nurse Practitioner

## 2023-05-25 ENCOUNTER — Ambulatory Visit: Payer: Commercial Managed Care - PPO | Admitting: Nurse Practitioner

## 2023-05-25 VITALS — BP 135/74 | HR 87 | Temp 98.3°F | Ht 62.0 in | Wt 206.0 lb

## 2023-05-25 DIAGNOSIS — R600 Localized edema: Secondary | ICD-10-CM | POA: Diagnosis not present

## 2023-05-25 DIAGNOSIS — E119 Type 2 diabetes mellitus without complications: Secondary | ICD-10-CM

## 2023-05-25 DIAGNOSIS — E669 Obesity, unspecified: Secondary | ICD-10-CM | POA: Diagnosis not present

## 2023-05-25 DIAGNOSIS — Z6837 Body mass index (BMI) 37.0-37.9, adult: Secondary | ICD-10-CM | POA: Diagnosis not present

## 2023-05-25 DIAGNOSIS — Z7985 Long-term (current) use of injectable non-insulin antidiabetic drugs: Secondary | ICD-10-CM

## 2023-05-25 DIAGNOSIS — R638 Other symptoms and signs concerning food and fluid intake: Secondary | ICD-10-CM | POA: Diagnosis not present

## 2023-05-25 MED ORDER — SEMAGLUTIDE (1 MG/DOSE) 4 MG/3ML ~~LOC~~ SOPN
1.0000 mg | PEN_INJECTOR | SUBCUTANEOUS | 0 refills | Status: DC
  Filled 2023-05-25 – 2023-06-02 (×3): qty 3, 28d supply, fill #0

## 2023-05-25 NOTE — Progress Notes (Signed)
Office: [phone]  /  Fax: [phone]  WEIGHT SUMMARY AND BIOMETRICS  Weight Lost Since Last Visit: 0lb  Weight Gained Since Last Visit: 6lb   Vitals Temp: 98.3 F (36.8 C) BP: 135/74 Pulse Rate: 87 SpO2: 96 %   Anthropometric Measurements Height: 5\' 2"  (1.575 m) Weight: 206 lb (93.4 kg) BMI (Calculated): 37.67 Weight at Last Visit: 200lb Weight Lost Since Last Visit: 0lb Weight Gained Since Last Visit: 6lb Starting Weight: 208lb Total Weight Loss (lbs): 2 lb (0.907 kg)   Body Composition  Body Fat %: 46.1 % Fat Mass (lbs): 95.4 lbs Muscle Mass (lbs): 105.8 lbs Total Body Water (lbs): 80.4 lbs Visceral Fat Rating : 11   Other Clinical Data Fasting: No Labs: No Today's Visit #: 11 Starting Date: 09/17/22     HPI  Chief Complaint: OBESITY  Abigail Frank is here to discuss her progress with her obesity treatment plan. She is on the keeping a food journal and adhering to recommended goals of 1500 calories and 60-70 protein and states she is following her eating plan approximately 100 % of the time. She states she is exercising 0 minutes 0 days per week.   Interval History:  Since last office visit she has gained 6 pounds.  She is averaging around 1200-1600 calories, 150 grams of carbs and 60-70 grams of protein. Snacking more in the middle of the night.  She is drinking water daily.  Struggling with polyphagia and cravings.  Her comfort is in food-eats when she is stressed.  Worse at night.    Pharmacotherapy for weight loss: She is not currently taking medications  for medical weight loss.      Previous pharmacotherapy for medical weight loss:  None   Bariatric surgery:  Patient has not had bariatric surgery  [name] She is taking Lozol 12.5mg  daily.  Denies side effects.  Denies SHOB or CP.  Edema in hands and legs have improved.  She has appt with cardiology on 07/21/23  Pharmacotherapy for DMT2:  She is currently taking Ozempic 0.5mg .  Denies side  effects.   Last A1c was 6.2 She is not checking BS at home.   Episodes of hypoglycemia: no Not on ACE or ARB, ASA 81mg  or statin.  Last eye exam:  Oct 2024 She has tried Ozempic and Glyburide in the past. Struggling with polyphagia and cravings.     Lab Results  Component Value Date   HGBA1C 6.2 (H) 10/31/2019   Lab Results  Component Value Date   LDLCALC 104 (H) 09/17/2022   CREATININE 0.74 12/03/2022     PHYSICAL EXAM:  Blood pressure 135/74, pulse 87, temperature 98.3 F (36.8 C), height 5\' 2"  (1.575 m), weight 206 lb (93.4 kg), last menstrual period 05/25/2023, SpO2 96%. Body mass index is 37.68 kg/m.  General: She is overweight, cooperative, alert, well developed, and in no acute distress. PSYCH: Has normal mood, affect and thought process.   Extremities: No edema.  Neurologic: No gross sensory or motor deficits. No tremors or fasciculations noted.    DIAGNOSTIC DATA REVIEWED:  BMET    Component Value Date/Time   NA 139 12/03/2022 1454   K 4.2 12/03/2022 1454   CL 99 12/03/2022 1454   CO2 24 12/03/2022 1454   GLUCOSE 90 12/03/2022 1454   GLUCOSE 151 (H) 06/16/2017 2322   BUN 16 12/03/2022 1454   CREATININE 0.74 12/03/2022 1454   CALCIUM 10.2 12/03/2022 1454   GFRNONAA 115 10/31/2019 1518   GFRAA 132 10/31/2019 1518  Lab Results  Component Value Date   HGBA1C 6.2 (H) 10/31/2019   Lab Results  Component Value Date   INSULIN 21.5 09/17/2022   Lab Results  Component Value Date   TSH 2.280 09/17/2022   CBC    Component Value Date/Time   WBC 10.9 (H) 12/03/2022 1454   WBC 8.5 07/14/2017 1209   RBC 5.03 12/03/2022 1454   RBC 5.12 (H) 07/14/2017 1209   HGB 13.5 12/03/2022 1454   HCT 42.3 12/03/2022 1454   PLT 325 12/03/2022 1454   MCV 84 12/03/2022 1454   MCH 26.8 12/03/2022 1454   MCH 27.9 06/16/2017 2322   MCHC 31.9 12/03/2022 1454   MCHC 33.6 07/14/2017 1209   RDW 13.2 12/03/2022 1454   Iron Studies    Component Value Date/Time   IRON  40 10/31/2019 1518   TIBC 333 10/31/2019 1518   FERRITIN 60 10/31/2019 1518   IRONPCTSAT 12 (L) 10/31/2019 1518   Lipid Panel     Component Value Date/Time   CHOL 181 09/17/2022 1042   TRIG 213 (H) 09/17/2022 1042   HDL 40 09/17/2022 1042   LDLCALC 104 (H) 09/17/2022 1042   Hepatic Function Panel     Component Value Date/Time   PROT 7.1 12/03/2022 1454   ALBUMIN 4.6 12/03/2022 1454   AST 15 12/03/2022 1454   ALT 15 12/03/2022 1454   ALKPHOS 90 12/03/2022 1454   BILITOT <0.2 12/03/2022 1454      Component Value Date/Time   TSH 2.280 09/17/2022 1042   Nutritional No results found for: "VD25OH"   ASSESSMENT AND PLAN  TREATMENT PLAN FOR OBESITY:  Recommended Dietary Goals  Shelisa is currently in the action stage of change. As such, her goal is to continue weight management plan. She has agreed to keeping a food journal and adhering to recommended goals of 1500 calories and 80+ grams of protein.  Behavioral Intervention  We discussed the following Behavioral Modification Strategies today: increasing lean protein intake to established goals, avoiding skipping meals, work on meal planning and preparation, keeping healthy foods at home, and continue to work on maintaining a reduced calorie state, getting the recommended amount of protein, incorporating whole foods, making healthy choices, staying well hydrated and practicing mindfulness when eating..  Additional resources provided today: NA  Recommended Physical Activity Goals  Quinn has been advised to work up to 150 minutes of moderate intensity aerobic activity a week and strengthening exercises 2-3 times per week for cardiovascular health, weight loss maintenance and preservation of muscle mass.   She has agreed to Think about enjoyable ways to increase daily physical activity and overcoming barriers to exercise, Increase physical activity in their day and reduce sedentary time (increase NEAT)., and Work on  scheduling and tracking physical activity.    ASSOCIATED CONDITIONS ADDRESSED TODAY  Action/Plan  Lower extremity edema Will continue to monitor.  Keep appt with cardiology  Controlled type 2 diabetes mellitus without complication, without long-term current use of insulin (HCC) -     Increase Semaglutide (1 MG/DOSE); Inject 1 mg as directed once a week.  Dispense: 3 mL; Refill: 0. Side effects discussed.   Abnormal craving Consider Wellbutrin at next visit.  Avoid Topamax due to side effects in the past   Generalized obesity  BMI 37.0-37.9, adult     Will obtain labs at next visit.     Return in about 4 weeks (around 06/22/2023).Marland Kitchen She was informed of the importance of frequent follow up visits to maximize  her success with intensive lifestyle modifications for her multiple health conditions.   ATTESTASTION STATEMENTS:  Reviewed by clinician on day of visit: allergies, medications, problem list, medical history, surgical history, family history, social history, and previous encounter notes.     Theodis Sato. Patty Leitzke FNP-C

## 2023-05-26 DIAGNOSIS — G4733 Obstructive sleep apnea (adult) (pediatric): Secondary | ICD-10-CM | POA: Diagnosis not present

## 2023-06-01 ENCOUNTER — Other Ambulatory Visit (HOSPITAL_COMMUNITY): Payer: Self-pay

## 2023-06-01 ENCOUNTER — Other Ambulatory Visit: Payer: Self-pay

## 2023-06-02 ENCOUNTER — Other Ambulatory Visit: Payer: Self-pay

## 2023-06-11 ENCOUNTER — Other Ambulatory Visit (HOSPITAL_COMMUNITY): Payer: Self-pay

## 2023-06-11 ENCOUNTER — Other Ambulatory Visit: Payer: Self-pay

## 2023-06-16 ENCOUNTER — Other Ambulatory Visit: Payer: Self-pay

## 2023-06-23 ENCOUNTER — Other Ambulatory Visit (HOSPITAL_COMMUNITY): Payer: Self-pay

## 2023-06-23 ENCOUNTER — Encounter: Payer: Self-pay | Admitting: Nurse Practitioner

## 2023-06-23 ENCOUNTER — Ambulatory Visit: Payer: Commercial Managed Care - PPO | Admitting: Nurse Practitioner

## 2023-06-23 VITALS — BP 105/71 | HR 80 | Temp 98.1°F | Ht 62.0 in | Wt 203.0 lb

## 2023-06-23 DIAGNOSIS — Z7985 Long-term (current) use of injectable non-insulin antidiabetic drugs: Secondary | ICD-10-CM

## 2023-06-23 DIAGNOSIS — R110 Nausea: Secondary | ICD-10-CM

## 2023-06-23 DIAGNOSIS — Z6837 Body mass index (BMI) 37.0-37.9, adult: Secondary | ICD-10-CM | POA: Diagnosis not present

## 2023-06-23 DIAGNOSIS — E119 Type 2 diabetes mellitus without complications: Secondary | ICD-10-CM

## 2023-06-23 DIAGNOSIS — E669 Obesity, unspecified: Secondary | ICD-10-CM

## 2023-06-23 MED ORDER — ONDANSETRON 4 MG PO TBDP
4.0000 mg | ORAL_TABLET | Freq: Four times a day (QID) | ORAL | 0 refills | Status: DC | PRN
  Filled 2023-06-23 – 2023-06-25 (×2): qty 30, 8d supply, fill #0

## 2023-06-23 MED ORDER — SEMAGLUTIDE (1 MG/DOSE) 4 MG/3ML ~~LOC~~ SOPN
1.0000 mg | PEN_INJECTOR | SUBCUTANEOUS | 0 refills | Status: DC
  Filled 2023-06-23 – 2023-06-25 (×2): qty 3, 28d supply, fill #0

## 2023-06-23 NOTE — Progress Notes (Signed)
 Office: [phone]  /  Fax: [phone]  WEIGHT SUMMARY AND BIOMETRICS  Weight Lost Since Last Visit: 3lb  Weight Gained Since Last Visit: 0lb   Vitals Temp: 98.1 F (36.7 C) BP: 105/71 Pulse Rate: 80 SpO2: 95 %   Anthropometric Measurements Height: 5' 2 (1.575 m) Weight: 203 lb (92.1 kg) BMI (Calculated): 37.12 Weight at Last Visit: 206lb Weight Lost Since Last Visit: 3lb Weight Gained Since Last Visit: 0lb Starting Weight: 208lb Total Weight Loss (lbs): 5 lb (2.268 kg)   Body Composition  Body Fat %: 44.5 % Fat Mass (lbs): 90.6 lbs Muscle Mass (lbs): 107.2 lbs Total Body Water (lbs): 77.6 lbs Visceral Fat Rating : 11   Other Clinical Data Fasting: Yes Labs: Yes Today's Visit #: 12 Starting Date: 09/17/22     HPI  Chief Complaint: OBESITY  Abigail Frank is here to discuss her progress with her obesity treatment plan. She is on the keeping a food journal and adhering to recommended goals of 1500 calories and 60-70 protein and states she is following her eating plan approximately 80-90 % of the time. She states she is exercising 0 minutes 0 days per week.   Interval History:  Since last office visit she has lost 3 pounds.  She reports that she did well over the holidays.  She tried to be mindful of what she was eating.  She is struggling with meeting her protein goals and plans to start a protein shake daily.  She is drinking water daily.  Doing better concerning polyphagia and cravings.   Pharmacotherapy for weight loss: She is not currently taking medications  for medical weight loss.      Previous pharmacotherapy for medical weight loss:  None   Bariatric surgery:  Patient has not had bariatric surgery  Pharmacotherapy for DMT2:  She is currently taking Ozempic  1mg  (increased after her last visit).  Occ takes Zofran  for nausea.  Asking for a refill.  Last A1c was 6.2 She is not checking BS at home.   Episodes of hypoglycemia: no Not on ACE or  ARB, ASA 81mg  or statin.  Last eye exam:  Oct 2024 She has tried Ozempic  and Glyburide in the past. She struggles with polyphagia and cravings.   Lab Results  Component Value Date   HGBA1C 6.2 (H) 10/31/2019   Lab Results  Component Value Date   LDLCALC 104 (H) 09/17/2022   CREATININE 0.74 12/03/2022     PHYSICAL EXAM:  Blood pressure 105/71, pulse 80, temperature 98.1 F (36.7 C), height 5' 2 (1.575 m), weight 203 lb (92.1 kg), last menstrual period 06/23/2023, SpO2 95%. Body mass index is 37.13 kg/m.  General: She is overweight, cooperative, alert, well developed, and in no acute distress. PSYCH: Has normal mood, affect and thought process.   Extremities: No edema.  Neurologic: No gross sensory or motor deficits. No tremors or fasciculations noted.    DIAGNOSTIC DATA REVIEWED:  BMET    Component Value Date/Time   NA 139 12/03/2022 1454   K 4.2 12/03/2022 1454   CL 99 12/03/2022 1454   CO2 24 12/03/2022 1454   GLUCOSE 90 12/03/2022 1454   GLUCOSE 151 (H) 06/16/2017 2322   BUN 16 12/03/2022 1454   CREATININE 0.74 12/03/2022 1454   CALCIUM  10.2 12/03/2022 1454   GFRNONAA 115 10/31/2019 1518   GFRAA 132 10/31/2019 1518   Lab Results  Component Value Date   HGBA1C 6.2 (H) 10/31/2019   Lab Results  Component Value Date  INSULIN  21.5 09/17/2022   Lab Results  Component Value Date   TSH 2.280 09/17/2022   CBC    Component Value Date/Time   WBC 10.9 (H) 12/03/2022 1454   WBC 8.5 07/14/2017 1209   RBC 5.03 12/03/2022 1454   RBC 5.12 (H) 07/14/2017 1209   HGB 13.5 12/03/2022 1454   HCT 42.3 12/03/2022 1454   PLT 325 12/03/2022 1454   MCV 84 12/03/2022 1454   MCH 26.8 12/03/2022 1454   MCH 27.9 06/16/2017 2322   MCHC 31.9 12/03/2022 1454   MCHC 33.6 07/14/2017 1209   RDW 13.2 12/03/2022 1454   Iron Studies    Component Value Date/Time   IRON 40 10/31/2019 1518   TIBC 333 10/31/2019 1518   FERRITIN 60 10/31/2019 1518   IRONPCTSAT 12 (L) 10/31/2019  1518   Lipid Panel     Component Value Date/Time   CHOL 181 09/17/2022 1042   TRIG 213 (H) 09/17/2022 1042   HDL 40 09/17/2022 1042   LDLCALC 104 (H) 09/17/2022 1042   Hepatic Function Panel     Component Value Date/Time   PROT 7.1 12/03/2022 1454   ALBUMIN 4.6 12/03/2022 1454   AST 15 12/03/2022 1454   ALT 15 12/03/2022 1454   ALKPHOS 90 12/03/2022 1454   BILITOT <0.2 12/03/2022 1454      Component Value Date/Time   TSH 2.280 09/17/2022 1042   Nutritional No results found for: VD25OH   ASSESSMENT AND PLAN  TREATMENT PLAN FOR OBESITY:  Recommended Dietary Goals  Abigail Frank is currently in the action stage of change. As such, her goal is to continue weight management plan. She has agreed to keeping a food journal and adhering to recommended goals of 1500 calories and 90+ grams of protein.  Behavioral Intervention  We discussed the following Behavioral Modification Strategies today: increasing lean protein intake to established goals, decreasing simple carbohydrates , increasing vegetables, avoiding skipping meals, increasing water intake , work on meal planning and preparation, work on tracking and journaling calories using tracking application, reading food labels , keeping healthy foods at home, continue to practice mindfulness when eating, and continue to work on maintaining a reduced calorie state, getting the recommended amount of protein, incorporating whole foods, making healthy choices, staying well hydrated and practicing mindfulness when eating..  Additional resources provided today: NA  Recommended Physical Activity Goals  Abigail Frank has been advised to work up to 150 minutes of moderate intensity aerobic activity a week and strengthening exercises 2-3 times per week for cardiovascular health, weight loss maintenance and preservation of muscle mass.   She has agreed to Think about enjoyable ways to increase daily physical activity and overcoming barriers to  exercise, Increase physical activity in their day and reduce sedentary time (increase NEAT)., and Work on scheduling and tracking physical activity.     ASSOCIATED CONDITIONS ADDRESSED TODAY  Action/Plan  Controlled type 2 diabetes mellitus without complication, without long-term current use of insulin  (HCC) -     Continue Semaglutide  (1 MG/DOSE); Inject 1 mg as directed once a week.  Dispense: 3 mL; Refill: 0. Side effects discussed  Nausea -     Ondansetron ; Dissolve 1 tablet by mouth every 6 hours as needed for nausea or for headache.  Dispense: 30 tablet; Refill: 0. Side effects discussed.   Generalized obesity  BMI 37.0-37.9, adult     Hold off on starting Wellbutrin  at this time. Will consider in the future based upon how she is doing concerning cravings.  Will check labs in 1-3 months.   Return in about 4 weeks (around 07/21/2023).Abigail Frank She was informed of the importance of frequent follow up visits to maximize her success with intensive lifestyle modifications for her multiple health conditions.   ATTESTASTION STATEMENTS:  Reviewed by clinician on day of visit: allergies, medications, problem list, medical history, surgical history, family history, social history, and previous encounter notes.     Abigail Frank SAUNDERS. Kerrion Kemppainen FNP-C

## 2023-06-25 ENCOUNTER — Other Ambulatory Visit (HOSPITAL_COMMUNITY): Payer: Self-pay

## 2023-06-25 ENCOUNTER — Other Ambulatory Visit: Payer: Self-pay

## 2023-06-25 MED ORDER — AMOXICILLIN-POT CLAVULANATE 875-125 MG PO TABS
1.0000 | ORAL_TABLET | Freq: Two times a day (BID) | ORAL | 0 refills | Status: DC
  Filled 2023-06-25: qty 14, 7d supply, fill #0

## 2023-06-26 DIAGNOSIS — G4733 Obstructive sleep apnea (adult) (pediatric): Secondary | ICD-10-CM | POA: Diagnosis not present

## 2023-06-29 ENCOUNTER — Other Ambulatory Visit (HOSPITAL_COMMUNITY): Payer: Self-pay

## 2023-06-29 DIAGNOSIS — R5383 Other fatigue: Secondary | ICD-10-CM | POA: Diagnosis not present

## 2023-06-29 DIAGNOSIS — J189 Pneumonia, unspecified organism: Secondary | ICD-10-CM | POA: Diagnosis not present

## 2023-06-29 DIAGNOSIS — Z1152 Encounter for screening for COVID-19: Secondary | ICD-10-CM | POA: Diagnosis not present

## 2023-06-29 DIAGNOSIS — E119 Type 2 diabetes mellitus without complications: Secondary | ICD-10-CM | POA: Diagnosis not present

## 2023-06-29 DIAGNOSIS — R058 Other specified cough: Secondary | ICD-10-CM | POA: Diagnosis not present

## 2023-06-29 DIAGNOSIS — R0981 Nasal congestion: Secondary | ICD-10-CM | POA: Diagnosis not present

## 2023-06-29 DIAGNOSIS — J4520 Mild intermittent asthma, uncomplicated: Secondary | ICD-10-CM | POA: Diagnosis not present

## 2023-06-29 MED ORDER — PROMETHAZINE-DM 6.25-15 MG/5ML PO SYRP
ORAL_SOLUTION | ORAL | 0 refills | Status: DC
  Filled 2023-06-29: qty 200, 10d supply, fill #0

## 2023-06-29 MED ORDER — LEVOFLOXACIN 500 MG PO TABS
500.0000 mg | ORAL_TABLET | Freq: Every day | ORAL | 0 refills | Status: DC
  Filled 2023-06-29: qty 10, 10d supply, fill #0

## 2023-07-02 ENCOUNTER — Other Ambulatory Visit: Payer: Self-pay

## 2023-07-09 ENCOUNTER — Other Ambulatory Visit: Payer: Self-pay

## 2023-07-10 ENCOUNTER — Other Ambulatory Visit: Payer: Self-pay

## 2023-07-10 ENCOUNTER — Other Ambulatory Visit (HOSPITAL_COMMUNITY): Payer: Self-pay

## 2023-07-16 ENCOUNTER — Ambulatory Visit (HOSPITAL_BASED_OUTPATIENT_CLINIC_OR_DEPARTMENT_OTHER): Payer: Commercial Managed Care - PPO | Admitting: Obstetrics & Gynecology

## 2023-07-19 ENCOUNTER — Other Ambulatory Visit (HOSPITAL_BASED_OUTPATIENT_CLINIC_OR_DEPARTMENT_OTHER): Payer: Self-pay | Admitting: Obstetrics & Gynecology

## 2023-07-19 DIAGNOSIS — F419 Anxiety disorder, unspecified: Secondary | ICD-10-CM

## 2023-07-20 ENCOUNTER — Other Ambulatory Visit: Payer: Self-pay

## 2023-07-20 ENCOUNTER — Other Ambulatory Visit (HOSPITAL_COMMUNITY): Payer: Self-pay

## 2023-07-20 MED ORDER — CITALOPRAM HYDROBROMIDE 40 MG PO TABS
40.0000 mg | ORAL_TABLET | Freq: Every day | ORAL | 3 refills | Status: DC
  Filled 2023-07-20: qty 90, 90d supply, fill #0

## 2023-07-21 ENCOUNTER — Ambulatory Visit (HOSPITAL_BASED_OUTPATIENT_CLINIC_OR_DEPARTMENT_OTHER): Payer: Commercial Managed Care - PPO | Admitting: Cardiology

## 2023-07-21 ENCOUNTER — Encounter (HOSPITAL_BASED_OUTPATIENT_CLINIC_OR_DEPARTMENT_OTHER): Payer: Self-pay | Admitting: Cardiology

## 2023-07-21 ENCOUNTER — Other Ambulatory Visit: Payer: Self-pay

## 2023-07-21 ENCOUNTER — Other Ambulatory Visit (HOSPITAL_COMMUNITY): Payer: Self-pay

## 2023-07-21 VITALS — BP 124/78 | HR 78 | Wt 212.8 lb

## 2023-07-21 DIAGNOSIS — Z0000 Encounter for general adult medical examination without abnormal findings: Secondary | ICD-10-CM | POA: Diagnosis not present

## 2023-07-21 DIAGNOSIS — R600 Localized edema: Secondary | ICD-10-CM | POA: Diagnosis not present

## 2023-07-21 DIAGNOSIS — Z5181 Encounter for therapeutic drug level monitoring: Secondary | ICD-10-CM | POA: Diagnosis not present

## 2023-07-21 DIAGNOSIS — R0602 Shortness of breath: Secondary | ICD-10-CM

## 2023-07-21 DIAGNOSIS — E785 Hyperlipidemia, unspecified: Secondary | ICD-10-CM

## 2023-07-21 MED ORDER — ATORVASTATIN CALCIUM 20 MG PO TABS
20.0000 mg | ORAL_TABLET | Freq: Every day | ORAL | 3 refills | Status: AC
  Filled 2023-07-21: qty 90, 90d supply, fill #0
  Filled 2023-12-14 – 2023-12-23 (×2): qty 90, 90d supply, fill #1
  Filled 2024-04-01: qty 90, 90d supply, fill #2
  Filled 2024-07-12: qty 90, 90d supply, fill #3

## 2023-07-21 NOTE — Progress Notes (Signed)
 Cardiology Office Note:    Date:  07/21/2023   ID:  Abigail Frank, DOB [birth year], MRN [id number]  PCP:  Tisovec, Richard W, MD  Cardiologist:  None  Electrophysiologist:  None   Referring MD: Frank Abigail, *   Chief Complaint  Patient presents with   Edema    History of Present Illness:    Abigail Frank is a 38 y.o. female with a hx of obesity, T2DM, hyperlipidemia, OSA who is referred by Abigail Becki, NP for evaluation of edema.  She reports she has been going to healthy weight and wellness, has lost 30 pounds but notices significant fluctuation in her weight day-to-day and has been having swelling in her ankles.  Had recent episode of pneumonia and since then has been feeling short of breath.  Also has had some chest discomfort since that time.  Prior to that she was walking for 30 minutes on the treadmill and denied any chest pain but did report some dyspnea on exertion.  She denies any lightheadedness, syncope, or palpitations.  No smoking history.  Family history includes mother was diagnosed with heart failure in her 55s and died at age 17.  Father has A-fib.    Past Medical History:  Diagnosis Date   Anxiety    Asthma    Bone spur    cervical L side; L side arm numbness intermittent    Depression    Diabetes in pregnancy    Diabetes type 2, controlled (HCC)    Elevated cholesterol with high triglycerides    Gestational diabetes    Hx of varicella    IBS (irritable bowel syndrome)    Kidney stones    followed by Dr. Patrcia   Migraines    without aura   Ovarian cyst    Postpartum care following vaginal delivery (1/30) 07/15/2013   Preterm uterine contractions 06/10/2013   Sleep apnea    Vitamin D  deficiency     Past Surgical History:  Procedure Laterality Date   MOUTH SURGERY     wisdom teeth extractions    Current Medications: Current Meds  Medication Sig   albuterol  (PROVENTIL  HFA;VENTOLIN  HFA) 108 (90 BASE) MCG/ACT inhaler  Inhale 2 puffs into the lungs every 6 (six) hours as needed for wheezing or shortness of breath.   ALPRAZolam  (XANAX ) 0.5 MG tablet Take 1 tablet (0.5 mg total) by mouth 2 (two) times daily as needed for anxiety   atorvastatin  (LIPITOR) 20 MG tablet Take 1 tablet (20 mg total) by mouth daily.   baclofen  (LIORESAL ) 10 MG tablet Take 1 tablet (10 mg total) by mouth daily as needed for muscle spasms (acute migraine onset).   citalopram  (CELEXA ) 40 MG tablet Take 1 tablet (40 mg total) by mouth daily.   Erenumab -aooe (AIMOVIG ) 140 MG/ML SOAJ Inject 140 mg into the skin every 30 (thirty) days.   fluticasone  furoate-vilanterol (BREO ELLIPTA ) 200-25 MCG/ACT AEPB Inhale 1 puff into the lungs daily.   gabapentin  (NEURONTIN ) 300 MG capsule Take 1 capsule (300 mg total) by mouth 2 (two) times daily.   hydrOXYzine  (ATARAX ) 25 MG tablet Take 1 tablet (25 mg total) by mouth at bedtime as needed.   ibuprofen  (ADVIL ) 600 MG tablet Take 600 mg by mouth every 6 (six) hours as needed.   MELATONIN PO Take 3 mg by mouth as needed.   metoCLOPramide  (REGLAN ) 10 MG tablet Take 1 tablet by mouth three times a day while having migraine or nausea.   norethindrone  (MICRONOR ) 0.35 MG  tablet Take 1 tablet by mouth daily.   ondansetron  (ZOFRAN -ODT) 4 MG disintegrating tablet Dissolve 1 tablet by mouth every 6 hours as needed for nausea or for headache.   rizatriptan  (MAXALT -MLT) 10 MG disintegrating tablet DISSOLVE 1 TABLET BY MOUTH AS NEEDED FOR MIGRAINE, MAY REPEAT IN 2 HOURS IF NEEDED   Semaglutide , 1 MG/DOSE, 4 MG/3ML SOPN Inject 1 mg as directed once a week.   Current Facility-Administered Medications for the 07/21/23 encounter (Office Visit) with Kate Lonni CROME, MD  Medication   triamcinolone  acetonide (KENALOG -40) injection 10 mg     Allergies:   Bactrim  [sulfamethoxazole -trimethoprim ], Hydrocodone, Sulfa  antibiotics, and Vicodin [hydrocodone-acetaminophen ]   Social History   Socioeconomic History    Marital status: Married    Spouse name: Not on file   Number of children: 2   Years of education: Not on file   Highest education level: Bachelor's degree (e.g., BA, AB, BS)  Occupational History   Not on file  Tobacco Use   Smoking status: Never   Smokeless tobacco: Never  Vaping Use   Vaping status: Never Used  Substance and Sexual Activity   Alcohol  use: Yes    Comment: on occasion a glass of wine   Drug use: Never   Sexual activity: Yes    Birth control/protection: OCP  Other Topics Concern   Not on file  Social History Narrative   Lives at home with her family   Left handed   Caffeine: regular, at least 2 cups of coffee daily   Social Drivers of Corporate Investment Banker Strain: Not on file  Food Insecurity: Not on file  Transportation Needs: Not on file  Physical Activity: Not on file  Stress: Not on file  Social Connections: Not on file     Family History: The patient's family history includes Cancer in her maternal grandmother; Colon cancer in her maternal grandmother; Dementia in her maternal grandmother and mother; Diabetes in her father and mother; Early death (age of onset: 20) in her maternal grandfather; Heart attack in her mother; Heart disease in her mother; Hypertension in her father, maternal grandmother, and mother; Hypothyroidism in her father and mother; Kidney disease in her mother; Obesity in her father; Pulmonary fibrosis in her maternal grandfather; Seizures in her child; Sleep apnea in her father and mother. There is no history of Migraines.  ROS:   Please see the history of present illness.     All other systems reviewed and are negative.  EKGs/Labs/Other Studies Reviewed:    The following studies were reviewed today:   EKG:   07/21/2023: Normal sinus rhythm, rate 78, artifact, nonspecific T wave flattening, poor R wave progression  Recent Labs: 09/17/2022: TSH 2.280 12/03/2022: ALT 15; BUN 16; Creatinine, Ser 0.74; Hemoglobin 13.5;  Platelets 325; Potassium 4.2; Sodium 139  Recent Lipid Panel    Component Value Date/Time   CHOL 181 09/17/2022 1042   TRIG 213 (H) 09/17/2022 1042   HDL 40 09/17/2022 1042   LDLCALC 104 (H) 09/17/2022 1042    Physical Exam:    VS:  BP 124/78 (BP Location: Left Arm, Patient Position: Sitting)   Pulse 78   Wt 212 lb 12.8 oz (96.5 kg)   LMP 06/23/2023 (Exact Date)   SpO2 98%   BMI 38.92 kg/m     Wt Readings from Last 3 Encounters:  07/21/23 212 lb 12.8 oz (96.5 kg)  06/23/23 203 lb (92.1 kg)  05/25/23 206 lb (93.4 kg)     GEN:  Well nourished, well developed in no acute distress HEENT: Normal NECK: No JVD; No carotid bruits LYMPHATICS: No lymphadenopathy CARDIAC: RRR, no murmurs, rubs, gallops RESPIRATORY:  Clear to auscultation without rales, wheezing or rhonchi  ABDOMEN: Soft, non-tender, non-distended MUSCULOSKELETAL:  No edema; No deformity  SKIN: Warm and dry NEUROLOGIC:  Alert and oriented x 3 PSYCHIATRIC:  Normal affect   ASSESSMENT:    1. SOB (shortness of breath)   2. Lower extremity edema   3. Health care maintenance   4. Therapeutic drug monitoring   5. Hyperlipidemia LDL goal <100    PLAN:    Dyspnea/lower extremity edema: Reports dyspnea on exertion and having intermittent edema and significant fluctuations in her weight.  No edema on exam today.  Check echocardiogram.  Check CMET and BNP  Obesity: Body mass index is 38.92 kg/m. Follows with healthy weight and wellness.  On Ozempic   T2DM: On Ozempic .  A1c was up to 6.7%, improved since starting Ozempic , most recent 5.8% on 02/2021:  Hyperlipidemia: LDL 117 on 02/13/2023.  Statin recommended given history of diabetes.  Will start atorvastatin  20 mg daily and check fasting lipid panel in 3 months  OSA: on CPAP, reports compliance  RTC in 6 months  Medication Adjustments/Labs and Tests Ordered: Current medicines are reviewed at length with the patient today.  Concerns regarding medicines are  outlined above.  Orders Placed This Encounter  Procedures   Magnesium   Comprehensive metabolic panel   B Nat Peptide   Lipid panel   EKG 12-Lead   ECHOCARDIOGRAM COMPLETE   Meds ordered this encounter  Medications   atorvastatin  (LIPITOR) 20 MG tablet    Sig: Take 1 tablet (20 mg total) by mouth daily.    Dispense:  90 tablet    Refill:  3    Patient Instructions  Medication Instructions:  START ATORVASTATIN  20 MG DAILY   *If you need a refill on your cardiac medications before your next appointment, please call your pharmacy*  Lab Work: CMET/BNP/MAGNESIUM TODAY   FASTING LP IN 3 MONTHS   If you have labs (blood work) drawn today and your tests are completely normal, you will receive your results only by: MyChart Message (if you have MyChart) OR A paper copy in the mail If you have any lab test that is abnormal or we need to change your treatment, we will call you to review the results.  Testing/Procedures: Your physician has requested that you have an echocardiogram. Echocardiography is a painless test that uses sound waves to create images of your heart. It provides your doctor with information about the size and shape of your heart and how well your heart's chambers and valves are working. This procedure takes approximately one hour. There are no restrictions for this procedure. Please do NOT wear cologne, perfume, aftershave, or lotions (deodorant is allowed). Please arrive 15 minutes prior to your appointment time.  Please note: We ask at that you not bring children with you during ultrasound (echo/ vascular) testing. Due to room size and safety concerns, children are not allowed in the ultrasound rooms during exams. Our front office staff cannot provide observation of children in our lobby area while testing is being conducted. An adult accompanying a patient to their appointment will only be allowed in the ultrasound room at the discretion of the ultrasound technician  under special circumstances. We apologize for any inconvenience.   Follow-Up: At [facility], you and your health needs are our priority.  As part  of our continuing mission to provide you with exceptional heart care, we have created designated Provider Care Teams.  These Care Teams include your primary Cardiologist (physician) and Advanced Practice Providers (APPs -  Physician Assistants and Nurse Practitioners) who all work together to provide you with the care you need, when you need it.  We recommend signing up for the patient portal called MyChart.  Sign up information is provided on this After Visit Summary.  MyChart is used to connect with patients for Virtual Visits (Telemedicine).  Patients are able to view lab/test results, encounter notes, upcoming appointments, etc.  Non-urgent messages can be sent to your provider as well.   To learn more about what you can do with MyChart, go to [email or web address].    Your next appointment:   6 month(s)  Provider:   Lonni Nanas, MD    Other Instructions       Signed, Lonni LITTIE Nanas, MD  07/21/2023 10:22 AM    [facility]

## 2023-07-21 NOTE — Patient Instructions (Signed)
 Medication Instructions:  START ATORVASTATIN  20 MG DAILY   *If you need a refill on your cardiac medications before your next appointment, please call your pharmacy*  Lab Work: CMET/BNP/MAGNESIUM TODAY   FASTING LP IN 3 MONTHS   If you have labs (blood work) drawn today and your tests are completely normal, you will receive your results only by: MyChart Message (if you have MyChart) OR A paper copy in the mail If you have any lab test that is abnormal or we need to change your treatment, we will call you to review the results.  Testing/Procedures: Your physician has requested that you have an echocardiogram. Echocardiography is a painless test that uses sound waves to create images of your heart. It provides your doctor with information about the size and shape of your heart and how well your heart's chambers and valves are working. This procedure takes approximately one hour. There are no restrictions for this procedure. Please do NOT wear cologne, perfume, aftershave, or lotions (deodorant is allowed). Please arrive 15 minutes prior to your appointment time.  Please note: We ask at that you not bring children with you during ultrasound (echo/ vascular) testing. Due to room size and safety concerns, children are not allowed in the ultrasound rooms during exams. Our front office staff cannot provide observation of children in our lobby area while testing is being conducted. An adult accompanying a patient to their appointment will only be allowed in the ultrasound room at the discretion of the ultrasound technician under special circumstances. We apologize for any inconvenience.   Follow-Up: At [facility], you and your health needs are our priority.  As part of our continuing mission to provide you with exceptional heart care, we have created designated Provider Care Teams.  These Care Teams include your primary Cardiologist (physician) and Advanced Practice Providers (APPs -   Physician Assistants and Nurse Practitioners) who all work together to provide you with the care you need, when you need it.  We recommend signing up for the patient portal called MyChart.  Sign up information is provided on this After Visit Summary.  MyChart is used to connect with patients for Virtual Visits (Telemedicine).  Patients are able to view lab/test results, encounter notes, upcoming appointments, etc.  Non-urgent messages can be sent to your provider as well.   To learn more about what you can do with MyChart, go to [email or web address].    Your next appointment:   6 month(s)  Provider:   Lonni Nanas, MD    Other Instructions

## 2023-07-22 ENCOUNTER — Other Ambulatory Visit: Payer: Self-pay

## 2023-07-22 ENCOUNTER — Other Ambulatory Visit (HOSPITAL_COMMUNITY): Payer: Self-pay

## 2023-07-22 LAB — COMPREHENSIVE METABOLIC PANEL
ALT: 17 [IU]/L (ref 0–32)
AST: 18 [IU]/L (ref 0–40)
Albumin: 4.3 g/dL (ref 3.9–4.9)
Alkaline Phosphatase: 94 [IU]/L (ref 44–121)
BUN/Creatinine Ratio: 15 (ref 9–23)
BUN: 11 mg/dL (ref 6–20)
Bilirubin Total: <0.2 mg/dL (ref 0.0–1.2)
CO2: 26 mmol/L (ref 20–29)
Calcium: 9.4 mg/dL (ref 8.7–10.2)
Chloride: 102 mmol/L (ref 96–106)
Creatinine, Ser: 0.72 mg/dL (ref 0.57–1.00)
Globulin, Total: 2.3 g/dL (ref 1.5–4.5)
Glucose: 100 mg/dL — ABNORMAL HIGH (ref 70–99)
Potassium: 4.6 mmol/L (ref 3.5–5.2)
Sodium: 142 mmol/L (ref 134–144)
Total Protein: 6.6 g/dL (ref 6.0–8.5)
eGFR: 110 mL/min/{1.73_m2} (ref >59)

## 2023-07-22 LAB — BRAIN NATRIURETIC PEPTIDE: BNP: 10.4 pg/mL (ref 0.0–100.0)

## 2023-07-22 LAB — MAGNESIUM: Magnesium: 2 mg/dL (ref 1.6–2.3)

## 2023-07-22 MED ORDER — PREDNISONE 10 MG PO TABS
20.0000 mg | ORAL_TABLET | Freq: Every day | ORAL | 0 refills | Status: DC
  Filled 2023-07-22: qty 10, 5d supply, fill #0

## 2023-07-23 ENCOUNTER — Encounter: Payer: Self-pay | Admitting: *Deleted

## 2023-07-23 ENCOUNTER — Telehealth: Payer: Self-pay | Admitting: Pharmacy Technician

## 2023-07-23 ENCOUNTER — Other Ambulatory Visit (HOSPITAL_COMMUNITY): Payer: Self-pay

## 2023-07-23 NOTE — Telephone Encounter (Signed)
 Pharmacy Patient Advocate Encounter   Received notification from CoverMyMeds that prior authorization for Aimovig  140MG /ML auto-injectors is required/requested.   Insurance verification completed.   The patient is insured through [facility] .   Per test claim: PA required; PA submitted to above mentioned insurance via CoverMyMeds Key/confirmation #/EOC [id number] Status is pending

## 2023-07-24 DIAGNOSIS — J189 Pneumonia, unspecified organism: Secondary | ICD-10-CM | POA: Diagnosis not present

## 2023-07-26 ENCOUNTER — Encounter: Payer: Self-pay | Admitting: Nurse Practitioner

## 2023-07-27 ENCOUNTER — Ambulatory Visit: Payer: Commercial Managed Care - PPO | Admitting: Nurse Practitioner

## 2023-07-29 NOTE — Telephone Encounter (Signed)
Pharmacy Patient Advocate Encounter  Received notification from [facility] that Prior Authorization for Aimovig 140MG /ML auto-injectors  has been APPROVED from 07/27/2023 to 01/23/2024   PA #/Case ID/Reference #: [id number]

## 2023-08-04 ENCOUNTER — Ambulatory Visit (HOSPITAL_BASED_OUTPATIENT_CLINIC_OR_DEPARTMENT_OTHER): Payer: Commercial Managed Care - PPO | Admitting: Obstetrics & Gynecology

## 2023-08-04 ENCOUNTER — Telehealth (HOSPITAL_BASED_OUTPATIENT_CLINIC_OR_DEPARTMENT_OTHER): Payer: Self-pay | Admitting: *Deleted

## 2023-08-04 NOTE — Telephone Encounter (Signed)
 Called patient and left a message to call the office back to reschedule the  missed appointment .

## 2023-08-11 ENCOUNTER — Other Ambulatory Visit (HOSPITAL_BASED_OUTPATIENT_CLINIC_OR_DEPARTMENT_OTHER): Payer: Commercial Managed Care - PPO

## 2023-08-12 ENCOUNTER — Telehealth: Payer: Self-pay

## 2023-08-12 NOTE — Telephone Encounter (Signed)
 Patient was unable to come to last appointment due to a family emergency. She came in today and was scheduled for a follow up 08/27/23. Her father did pass away and she would like to discuss starting a medication with Wellbutrin because of increased cravings. Weight strip on your desk.

## 2023-08-13 ENCOUNTER — Other Ambulatory Visit: Payer: Self-pay

## 2023-08-13 ENCOUNTER — Other Ambulatory Visit (HOSPITAL_COMMUNITY): Payer: Self-pay

## 2023-08-14 ENCOUNTER — Other Ambulatory Visit (HOSPITAL_COMMUNITY): Payer: Self-pay

## 2023-08-14 ENCOUNTER — Other Ambulatory Visit: Payer: Self-pay

## 2023-08-14 MED ORDER — ALPRAZOLAM 0.5 MG PO TABS
0.5000 mg | ORAL_TABLET | Freq: Two times a day (BID) | ORAL | 3 refills | Status: DC | PRN
  Filled 2023-08-14 – 2023-08-24 (×2): qty 30, 15d supply, fill #0
  Filled 2023-10-27: qty 30, 15d supply, fill #1
  Filled 2023-12-14 – 2023-12-23 (×2): qty 30, 15d supply, fill #2
  Filled 2024-02-03: qty 30, 15d supply, fill #3

## 2023-08-24 ENCOUNTER — Other Ambulatory Visit (HOSPITAL_COMMUNITY): Payer: Self-pay

## 2023-08-26 DIAGNOSIS — M25612 Stiffness of left shoulder, not elsewhere classified: Secondary | ICD-10-CM | POA: Diagnosis not present

## 2023-08-26 DIAGNOSIS — M546 Pain in thoracic spine: Secondary | ICD-10-CM | POA: Diagnosis not present

## 2023-08-26 DIAGNOSIS — M9902 Segmental and somatic dysfunction of thoracic region: Secondary | ICD-10-CM | POA: Diagnosis not present

## 2023-08-26 DIAGNOSIS — M9901 Segmental and somatic dysfunction of cervical region: Secondary | ICD-10-CM | POA: Diagnosis not present

## 2023-08-26 DIAGNOSIS — M7918 Myalgia, other site: Secondary | ICD-10-CM | POA: Diagnosis not present

## 2023-08-26 DIAGNOSIS — M7912 Myalgia of auxiliary muscles, head and neck: Secondary | ICD-10-CM | POA: Diagnosis not present

## 2023-08-27 ENCOUNTER — Encounter: Payer: Self-pay | Admitting: Nurse Practitioner

## 2023-08-27 ENCOUNTER — Ambulatory Visit: Payer: Commercial Managed Care - PPO | Admitting: Nurse Practitioner

## 2023-08-27 ENCOUNTER — Other Ambulatory Visit: Payer: Self-pay

## 2023-08-27 VITALS — BP 114/76 | HR 80 | Temp 97.9°F | Ht 62.0 in | Wt 207.0 lb

## 2023-08-27 DIAGNOSIS — Z7985 Long-term (current) use of injectable non-insulin antidiabetic drugs: Secondary | ICD-10-CM | POA: Diagnosis not present

## 2023-08-27 DIAGNOSIS — E119 Type 2 diabetes mellitus without complications: Secondary | ICD-10-CM

## 2023-08-27 DIAGNOSIS — R638 Other symptoms and signs concerning food and fluid intake: Secondary | ICD-10-CM | POA: Diagnosis not present

## 2023-08-27 DIAGNOSIS — E669 Obesity, unspecified: Secondary | ICD-10-CM | POA: Diagnosis not present

## 2023-08-27 DIAGNOSIS — Z6837 Body mass index (BMI) 37.0-37.9, adult: Secondary | ICD-10-CM | POA: Diagnosis not present

## 2023-08-27 MED ORDER — SEMAGLUTIDE (1 MG/DOSE) 4 MG/3ML ~~LOC~~ SOPN
1.0000 mg | PEN_INJECTOR | SUBCUTANEOUS | 0 refills | Status: DC
  Filled 2023-08-27: qty 3, 28d supply, fill #0

## 2023-08-27 MED ORDER — BUPROPION HCL ER (SR) 150 MG PO TB12
150.0000 mg | ORAL_TABLET | Freq: Every day | ORAL | 0 refills | Status: DC
Start: 2023-08-27 — End: 2023-09-14
  Filled 2023-08-27: qty 30, 30d supply, fill #0

## 2023-08-27 NOTE — Progress Notes (Signed)
 Office: [phone]  /  Fax: [phone]  WEIGHT SUMMARY AND BIOMETRICS  Weight Lost Since Last Visit: 0lb  Weight Gained Since Last Visit: 4lb   Vitals Temp: 97.9 F (36.6 C) BP: 114/76 Pulse Rate: 80 SpO2: 97 %   Anthropometric Measurements Height: 5\' 2"  (1.575 m) Weight: 207 lb (93.9 kg) BMI (Calculated): 37.85 Weight at Last Visit: 203lb Weight Lost Since Last Visit: 0lb Weight Gained Since Last Visit: 4lb Starting Weight: 208lb   Body Composition  Body Fat %: 44.8 % Fat Mass (lbs): 92.8 lbs Muscle Mass (lbs): 108.6 lbs Total Body Water (lbs): 78.2 lbs Visceral Fat Rating : 11   Other Clinical Data Fasting: no Labs: no Today's Visit #: 13 Starting Date: 09/17/22     HPI  Chief Complaint: OBESITY  Abigail Frank is here to discuss her progress with her obesity treatment plan. She is on the keeping a food journal and adhering to recommended goals of 1500-1600 calories and 60-70 protein and states she is following her eating plan approximately 80 % of the time. She states she is exercising 15-30 minutes 7 days per week.   Interval History:  Since last office visit she has gained 4 pounds.  She came in on 08/12/23 for a weight check and weighed 211 lbs.  She feels that her weight gain is due to having pneumonia and her father passing away on 08/02/23.   She is struggling with depression with losing her father and has been stress eating and cravings.  She has been waking up at night snacking.  She has been walking more.     Pharmacotherapy for weight loss: She is not currently taking medications  for medical weight loss.      Previous pharmacotherapy for medical weight loss:  None   Bariatric surgery:  Patient has not had bariatric surgery  Pharmacotherapy for DMT2:  She is currently taking Ozempic 1mg . Occ takes Zofran for nausea.   Last A1c was 6.2 She is not checking BS at home.   Episodes of hypoglycemia: no Not on ACE or ARB, ASA 81mg  or statin.   Last eye exam:  Oct 2024 She has tried Ozempic and Glyburide in the past. She struggles with polyphagia and cravings.   Lab Results  Component Value Date   HGBA1C 6.2 (H) 10/31/2019   Lab Results  Component Value Date   LDLCALC 104 (H) 09/17/2022   CREATININE 0.72 07/21/2023      PHYSICAL EXAM:  Blood pressure 114/76, pulse 80, temperature 97.9 F (36.6 C), height 5\' 2"  (1.575 m), weight 207 lb (93.9 kg), SpO2 97%. Body mass index is 37.86 kg/m.  General: She is overweight, cooperative, alert, well developed, and in no acute distress. PSYCH: Has normal mood, affect and thought process.   Extremities: No edema.  Neurologic: No gross sensory or motor deficits. No tremors or fasciculations noted.    DIAGNOSTIC DATA REVIEWED:  BMET    Component Value Date/Time   NA 142 07/21/2023 1037   K 4.6 07/21/2023 1037   CL 102 07/21/2023 1037   CO2 26 07/21/2023 1037   GLUCOSE 100 (H) 07/21/2023 1037   GLUCOSE 151 (H) 06/16/2017 2322   BUN 11 07/21/2023 1037   CREATININE 0.72 07/21/2023 1037   CALCIUM 9.4 07/21/2023 1037   GFRNONAA 115 10/31/2019 1518   GFRAA 132 10/31/2019 1518   Lab Results  Component Value Date   HGBA1C 6.2 (H) 10/31/2019   Lab Results  Component Value Date   INSULIN 21.5  09/17/2022   Lab Results  Component Value Date   TSH 2.280 09/17/2022   CBC    Component Value Date/Time   WBC 10.9 (H) 12/03/2022 1454   WBC 8.5 07/14/2017 1209   RBC 5.03 12/03/2022 1454   RBC 5.12 (H) 07/14/2017 1209   HGB 13.5 12/03/2022 1454   HCT 42.3 12/03/2022 1454   PLT 325 12/03/2022 1454   MCV 84 12/03/2022 1454   MCH 26.8 12/03/2022 1454   MCH 27.9 06/16/2017 2322   MCHC 31.9 12/03/2022 1454   MCHC 33.6 07/14/2017 1209   RDW 13.2 12/03/2022 1454   Iron Studies    Component Value Date/Time   IRON 40 10/31/2019 1518   TIBC 333 10/31/2019 1518   FERRITIN 60 10/31/2019 1518   IRONPCTSAT 12 (L) 10/31/2019 1518   Lipid Panel     Component Value  Date/Time   CHOL 181 09/17/2022 1042   TRIG 213 (H) 09/17/2022 1042   HDL 40 09/17/2022 1042   LDLCALC 104 (H) 09/17/2022 1042   Hepatic Function Panel     Component Value Date/Time   PROT 6.6 07/21/2023 1037   ALBUMIN 4.3 07/21/2023 1037   AST 18 07/21/2023 1037   ALT 17 07/21/2023 1037   ALKPHOS 94 07/21/2023 1037   BILITOT <0.2 07/21/2023 1037      Component Value Date/Time   TSH 2.280 09/17/2022 1042   Nutritional No results found for: "VD25OH"   ASSESSMENT AND PLAN  TREATMENT PLAN FOR OBESITY:  Recommended Dietary Goals  Abigail Frank is currently in the action stage of change. As such, her goal is to continue weight management plan. She has agreed to keeping a food journal and adhering to recommended goals of 1500-1600 calories and 100+ grams protein.  Behavioral Intervention  We discussed the following Behavioral Modification Strategies today: increasing lean protein intake to established goals, decreasing simple carbohydrates , increasing vegetables, increasing water intake , work on meal planning and preparation, reading food labels , keeping healthy foods at home, work on managing stress, creating time for self-care and relaxation, and continue to work on maintaining a reduced calorie state, getting the recommended amount of protein, incorporating whole foods, making healthy choices, staying well hydrated and practicing mindfulness when eating..  Additional resources provided today: NA  Recommended Physical Activity Goals  Abigail Frank has been advised to work up to 150 minutes of moderate intensity aerobic activity a week and strengthening exercises 2-3 times per week for cardiovascular health, weight loss maintenance and preservation of muscle mass.   She has agreed to Think about enjoyable ways to increase daily physical activity and overcoming barriers to exercise, Increase physical activity in their day and reduce sedentary time (increase NEAT)., Increase the  intensity, frequency or duration of strengthening exercises , and Increase the intensity, frequency or duration of aerobic exercises     Pharmacotherapy We discussed various medication options to help Abigail Frank with her weight loss efforts and we both agreed to start Wellbutrin SR 150mg  for cravings.  Side effects discussed.    ASSOCIATED CONDITIONS ADDRESSED TODAY  Action/Plan  Abnormal craving -     Start buPROPion HCl ER (SR); Take 1 tablet (150 mg total) by mouth daily.  Dispense: 30 tablet; Refill: 0  Controlled type 2 diabetes mellitus without complication, without long-term current use of insulin (HCC) -     Continue Semaglutide (1 MG/DOSE); Inject 1 mg as directed once a week.  Dispense: 3 mL; Refill: 0  Generalized obesity  BMI 37.0-37.9, adult  Discussed grief therapy.      Return in about 4 weeks (around 09/24/2023).Marland Kitchen She was informed of the importance of frequent follow up visits to maximize her success with intensive lifestyle modifications for her multiple health conditions.   ATTESTASTION STATEMENTS:  Reviewed by clinician on day of visit: allergies, medications, problem list, medical history, surgical history, family history, social history, and previous encounter notes.     Theodis Sato. Abigail Nater FNP-C

## 2023-08-28 ENCOUNTER — Ambulatory Visit (HOSPITAL_BASED_OUTPATIENT_CLINIC_OR_DEPARTMENT_OTHER): Payer: Commercial Managed Care - PPO

## 2023-08-28 DIAGNOSIS — R0602 Shortness of breath: Secondary | ICD-10-CM | POA: Diagnosis not present

## 2023-08-28 DIAGNOSIS — R600 Localized edema: Secondary | ICD-10-CM | POA: Diagnosis not present

## 2023-08-28 LAB — ECHOCARDIOGRAM COMPLETE
AR max vel: 1.63 cm2
AV Area VTI: 1.54 cm2
AV Area mean vel: 1.59 cm2
AV Mean grad: 5 mmHg
AV Peak grad: 10.5 mmHg
Ao pk vel: 1.62 m/s
S' Lateral: 2.9 cm

## 2023-08-31 ENCOUNTER — Encounter: Payer: Self-pay | Admitting: *Deleted

## 2023-08-31 DIAGNOSIS — Z8241 Family history of sudden cardiac death: Secondary | ICD-10-CM | POA: Diagnosis not present

## 2023-08-31 DIAGNOSIS — J302 Other seasonal allergic rhinitis: Secondary | ICD-10-CM | POA: Diagnosis not present

## 2023-08-31 DIAGNOSIS — E781 Pure hyperglyceridemia: Secondary | ICD-10-CM | POA: Diagnosis not present

## 2023-08-31 DIAGNOSIS — E119 Type 2 diabetes mellitus without complications: Secondary | ICD-10-CM | POA: Diagnosis not present

## 2023-08-31 DIAGNOSIS — J4520 Mild intermittent asthma, uncomplicated: Secondary | ICD-10-CM | POA: Diagnosis not present

## 2023-08-31 DIAGNOSIS — M4302 Spondylolysis, cervical region: Secondary | ICD-10-CM | POA: Diagnosis not present

## 2023-08-31 DIAGNOSIS — F419 Anxiety disorder, unspecified: Secondary | ICD-10-CM | POA: Diagnosis not present

## 2023-08-31 DIAGNOSIS — E669 Obesity, unspecified: Secondary | ICD-10-CM | POA: Diagnosis not present

## 2023-08-31 DIAGNOSIS — G43909 Migraine, unspecified, not intractable, without status migrainosus: Secondary | ICD-10-CM | POA: Diagnosis not present

## 2023-08-31 DIAGNOSIS — F331 Major depressive disorder, recurrent, moderate: Secondary | ICD-10-CM | POA: Diagnosis not present

## 2023-09-07 ENCOUNTER — Encounter: Payer: Self-pay | Admitting: Nurse Practitioner

## 2023-09-09 ENCOUNTER — Other Ambulatory Visit (HOSPITAL_COMMUNITY): Payer: Self-pay

## 2023-09-12 ENCOUNTER — Other Ambulatory Visit (HOSPITAL_COMMUNITY): Payer: Self-pay

## 2023-09-12 ENCOUNTER — Other Ambulatory Visit (HOSPITAL_BASED_OUTPATIENT_CLINIC_OR_DEPARTMENT_OTHER): Payer: Self-pay

## 2023-09-14 ENCOUNTER — Other Ambulatory Visit: Payer: Self-pay | Admitting: Nurse Practitioner

## 2023-09-14 ENCOUNTER — Other Ambulatory Visit (HOSPITAL_COMMUNITY): Payer: Self-pay

## 2023-09-14 ENCOUNTER — Other Ambulatory Visit: Payer: Self-pay

## 2023-09-14 DIAGNOSIS — R638 Other symptoms and signs concerning food and fluid intake: Secondary | ICD-10-CM

## 2023-09-14 MED ORDER — BUPROPION HCL ER (SR) 150 MG PO TB12
150.0000 mg | ORAL_TABLET | Freq: Every day | ORAL | 0 refills | Status: DC
  Filled 2023-09-14 – 2023-09-22 (×2): qty 30, 30d supply, fill #0

## 2023-09-15 ENCOUNTER — Other Ambulatory Visit (HOSPITAL_COMMUNITY): Payer: Self-pay

## 2023-09-22 ENCOUNTER — Other Ambulatory Visit (HOSPITAL_COMMUNITY): Payer: Self-pay

## 2023-09-22 ENCOUNTER — Other Ambulatory Visit: Payer: Self-pay

## 2023-09-22 ENCOUNTER — Other Ambulatory Visit: Payer: Self-pay | Admitting: Nurse Practitioner

## 2023-09-22 DIAGNOSIS — E119 Type 2 diabetes mellitus without complications: Secondary | ICD-10-CM

## 2023-09-24 ENCOUNTER — Other Ambulatory Visit (HOSPITAL_BASED_OUTPATIENT_CLINIC_OR_DEPARTMENT_OTHER): Payer: Self-pay | Admitting: Obstetrics & Gynecology

## 2023-09-24 DIAGNOSIS — Z8742 Personal history of other diseases of the female genital tract: Secondary | ICD-10-CM

## 2023-09-25 ENCOUNTER — Other Ambulatory Visit: Payer: Self-pay

## 2023-09-25 ENCOUNTER — Other Ambulatory Visit (HOSPITAL_COMMUNITY): Payer: Self-pay

## 2023-09-25 MED ORDER — NORETHINDRONE 0.35 MG PO TABS
1.0000 | ORAL_TABLET | Freq: Every day | ORAL | 0 refills | Status: DC
  Filled 2023-09-25: qty 84, 84d supply, fill #0

## 2023-09-29 ENCOUNTER — Other Ambulatory Visit: Payer: Self-pay

## 2023-09-29 ENCOUNTER — Ambulatory Visit: Admitting: Nurse Practitioner

## 2023-09-29 ENCOUNTER — Other Ambulatory Visit (HOSPITAL_COMMUNITY): Payer: Self-pay

## 2023-09-29 ENCOUNTER — Encounter: Payer: Self-pay | Admitting: Nurse Practitioner

## 2023-09-29 VITALS — BP 111/76 | HR 91 | Ht 62.0 in | Wt 206.0 lb

## 2023-09-29 DIAGNOSIS — Z7985 Long-term (current) use of injectable non-insulin antidiabetic drugs: Secondary | ICD-10-CM | POA: Diagnosis not present

## 2023-09-29 DIAGNOSIS — Z6837 Body mass index (BMI) 37.0-37.9, adult: Secondary | ICD-10-CM | POA: Diagnosis not present

## 2023-09-29 DIAGNOSIS — R638 Other symptoms and signs concerning food and fluid intake: Secondary | ICD-10-CM | POA: Diagnosis not present

## 2023-09-29 DIAGNOSIS — E119 Type 2 diabetes mellitus without complications: Secondary | ICD-10-CM

## 2023-09-29 DIAGNOSIS — E66812 Obesity, class 2: Secondary | ICD-10-CM | POA: Diagnosis not present

## 2023-09-29 MED ORDER — SEMAGLUTIDE (1 MG/DOSE) 4 MG/3ML ~~LOC~~ SOPN
1.0000 mg | PEN_INJECTOR | SUBCUTANEOUS | 0 refills | Status: DC
Start: 2023-09-29 — End: 2023-11-03
  Filled 2023-09-29: qty 3, 28d supply, fill #0

## 2023-09-29 MED ORDER — BUPROPION HCL ER (SR) 150 MG PO TB12
150.0000 mg | ORAL_TABLET | Freq: Every day | ORAL | 0 refills | Status: DC
  Filled 2023-09-29 – 2023-10-27 (×2): qty 90, 90d supply, fill #0

## 2023-09-29 NOTE — Progress Notes (Signed)
 Office: [phone]  /  Fax: [phone]  WEIGHT SUMMARY AND BIOMETRICS  Weight Lost Since Last Visit: 1lb  Weight Gained Since Last Visit: 0lb   Vitals BP: 111/76 Pulse Rate: 91 SpO2: 97 %   Anthropometric Measurements Height: 5\' 2"  (1.575 m) Weight: 206 lb (93.4 kg) BMI (Calculated): 37.67 Weight at Last Visit: 207lb Weight Lost Since Last Visit: 1lb Weight Gained Since Last Visit: 0lb Starting Weight: 208lb Total Weight Loss (lbs): 2 lb (0.907 kg)   Body Composition  Body Fat %: 44.1 % Fat Mass (lbs): 91 lbs Muscle Mass (lbs): 109.6 lbs Total Body Water (lbs): 79.4 lbs Visceral Fat Rating : 11   Other Clinical Data Fasting: No Labs: No Today's Visit #: 14 Starting Date: 09/17/22     HPI  Chief Complaint: OBESITY  Abigail Frank is here to discuss her progress with her obesity treatment plan. She is on the keeping a food journal and adhering to recommended goals of 1500-1600 calories and 90-120 protein and states she is following her eating plan approximately 80 % of the time. She states she is exercising 30 minutes 7 days per week-walking.   Interval History:  Since last office visit she has lost 1 pound. She is eating 3 meals and 2 snacks daily.   She is eating 2 fruits and a yogurt/tuna/nuts daily for her snacks.  She is not eating a lot of vegetables.  She is drinking water and a protein shake daily.  She is struggling with constipation.    She is going to [place] in July on a cruise  Pharmacotherapy for weight loss: She is currently taking Wellbutrin SR 150mg .  Denies side effects.  She did have some tremors when she was taking Celexa 40mg  with the addition of Wellbutrin.  She decreased her Celexa to 20mg  and the tremors subsided.   Wellbutrin has helped with cravings and nighttime eating.  Notes her energy has increased.  She still takes a nap but finds she is able to get more things done     Previous pharmacotherapy for medical weight loss:  None    Bariatric surgery:  Patient has not had bariatric surgery  Pharmacotherapy for DMT2:  She is currently taking Ozempic 1mg . Occ takes Zofran for nausea.   Last A1c was 6.1-at PCP's office in March She is not checking BS at home.   Episodes of hypoglycemia: no Not on ACE, ARB, ASA 81mg  or statin.  Last eye exam:  Oct 2024 She has tried Ozempic and Glyburide in the past. She struggles with polyphagia and cravings.     Lab Results  Component Value Date   HGBA1C 6.2 (H) 10/31/2019   Lab Results  Component Value Date   LDLCALC 104 (H) 09/17/2022   CREATININE 0.72 07/21/2023    Abnormal cravings She is taking Wellbutrin SR 150mg .  Started after last visit.  Doing well. Denies side effects.    PHYSICAL EXAM:  Blood pressure 111/76, pulse 91, height 5\' 2"  (1.575 m), weight 206 lb (93.4 kg), SpO2 97%. Body mass index is 37.68 kg/m.  General: She is overweight, cooperative, alert, well developed, and in no acute distress. PSYCH: Has normal mood, affect and thought process.   Extremities: No edema.  Neurologic: No gross sensory or motor deficits. No tremors or fasciculations noted.    DIAGNOSTIC DATA REVIEWED:  BMET    Component Value Date/Time   NA 142 07/21/2023 1037   K 4.6 07/21/2023 1037   CL 102 07/21/2023 1037  CO2 26 07/21/2023 1037   GLUCOSE 100 (H) 07/21/2023 1037   GLUCOSE 151 (H) 06/16/2017 2322   BUN 11 07/21/2023 1037   CREATININE 0.72 07/21/2023 1037   CALCIUM 9.4 07/21/2023 1037   GFRNONAA 115 10/31/2019 1518   GFRAA 132 10/31/2019 1518   Lab Results  Component Value Date   HGBA1C 6.2 (H) 10/31/2019   Lab Results  Component Value Date   INSULIN 21.5 09/17/2022   Lab Results  Component Value Date   TSH 2.280 09/17/2022   CBC    Component Value Date/Time   WBC 10.9 (H) 12/03/2022 1454   WBC 8.5 07/14/2017 1209   RBC 5.03 12/03/2022 1454   RBC 5.12 (H) 07/14/2017 1209   HGB 13.5 12/03/2022 1454   HCT 42.3 12/03/2022 1454   PLT 325  12/03/2022 1454   MCV 84 12/03/2022 1454   MCH 26.8 12/03/2022 1454   MCH 27.9 06/16/2017 2322   MCHC 31.9 12/03/2022 1454   MCHC 33.6 07/14/2017 1209   RDW 13.2 12/03/2022 1454   Iron Studies    Component Value Date/Time   IRON 40 10/31/2019 1518   TIBC 333 10/31/2019 1518   FERRITIN 60 10/31/2019 1518   IRONPCTSAT 12 (L) 10/31/2019 1518   Lipid Panel     Component Value Date/Time   CHOL 181 09/17/2022 1042   TRIG 213 (H) 09/17/2022 1042   HDL 40 09/17/2022 1042   LDLCALC 104 (H) 09/17/2022 1042   Hepatic Function Panel     Component Value Date/Time   PROT 6.6 07/21/2023 1037   ALBUMIN 4.3 07/21/2023 1037   AST 18 07/21/2023 1037   ALT 17 07/21/2023 1037   ALKPHOS 94 07/21/2023 1037   BILITOT <0.2 07/21/2023 1037      Component Value Date/Time   TSH 2.280 09/17/2022 1042   Nutritional No results found for: "VD25OH"   ASSESSMENT AND PLAN  TREATMENT PLAN FOR OBESITY:  Recommended Dietary Goals  Abigail Frank is currently in the action stage of change. As such, her goal is to continue weight management plan. She has agreed to keeping a food journal and adhering to recommended goals of 1500-1600 calories and 90-120 grams protein.  Change protein shake to a lactose free one.  May stop protein shake for a week to see if will help with constipation.  Increase water and vegetables.    Behavioral Intervention  We discussed the following Behavioral Modification Strategies today: decreasing simple carbohydrates , increasing vegetables, increasing fiber rich foods, increasing water intake , and continue to work on maintaining a reduced calorie state, getting the recommended amount of protein, incorporating whole foods, making healthy choices, staying well hydrated and practicing mindfulness when eating..  Additional resources provided today: NA  Recommended Physical Activity Goals  Abigail Frank has been advised to work up to 150 minutes of moderate intensity aerobic activity  a week and strengthening exercises 2-3 times per week for cardiovascular health, weight loss maintenance and preservation of muscle mass.   She has agreed to Think about enjoyable ways to increase daily physical activity and overcoming barriers to exercise, Increase physical activity in their day and reduce sedentary time (increase NEAT)., Increase the intensity, frequency or duration of strengthening exercises , and Increase the intensity, frequency or duration of aerobic exercises     ASSOCIATED CONDITIONS ADDRESSED TODAY  Action/Plan  Controlled type 2 diabetes mellitus without complication, without long-term current use of insulin (HCC) -    Continue Semaglutide (1 MG/DOSE); Inject 1 mg as directed once  a week.  Dispense: 3 mL; Refill: 0.  Will not increase dose due to constipation.  May need to decrease if worsens or persist.    Abnormal craving -     Continue buPROPion HCl ER (SR); Take 1 tablet (150 mg total) by mouth daily.  Dispense: 90 tablet; Refill: 0.  Side effects discussed.   Class 2 obesity due to excess calories without serious comorbidity with body mass index (BMI) of 37.0 to 37.9 in adult      To send me labs via mychart from PCP's office-March   Return in about 4 weeks (around 10/27/2023).Aaron Aas She was informed of the importance of frequent follow up visits to maximize her success with intensive lifestyle modifications for her multiple health conditions.   ATTESTASTION STATEMENTS:  Reviewed by clinician on day of visit: allergies, medications, problem list, medical history, surgical history, family history, social history, and previous encounter notes.     Crist Dominion. Abigail Thayne FNP-C

## 2023-10-08 ENCOUNTER — Other Ambulatory Visit: Payer: Self-pay

## 2023-10-08 ENCOUNTER — Ambulatory Visit (HOSPITAL_BASED_OUTPATIENT_CLINIC_OR_DEPARTMENT_OTHER): Admitting: Certified Nurse Midwife

## 2023-10-08 ENCOUNTER — Encounter (HOSPITAL_BASED_OUTPATIENT_CLINIC_OR_DEPARTMENT_OTHER): Payer: Self-pay | Admitting: Certified Nurse Midwife

## 2023-10-08 ENCOUNTER — Other Ambulatory Visit (HOSPITAL_COMMUNITY): Payer: Self-pay

## 2023-10-08 VITALS — BP 118/51 | HR 73 | Ht 62.5 in | Wt 206.2 lb

## 2023-10-08 DIAGNOSIS — Z3041 Encounter for surveillance of contraceptive pills: Secondary | ICD-10-CM | POA: Diagnosis not present

## 2023-10-08 DIAGNOSIS — N816 Rectocele: Secondary | ICD-10-CM

## 2023-10-08 DIAGNOSIS — Z01419 Encounter for gynecological examination (general) (routine) without abnormal findings: Secondary | ICD-10-CM | POA: Diagnosis not present

## 2023-10-08 MED ORDER — CITALOPRAM HYDROBROMIDE 20 MG PO TABS
20.0000 mg | ORAL_TABLET | Freq: Every day | ORAL | 12 refills | Status: AC
  Filled 2023-10-08: qty 90, 90d supply, fill #0
  Filled 2024-03-07: qty 90, 90d supply, fill #1

## 2023-10-08 MED ORDER — NORETHINDRONE 0.35 MG PO TABS
1.0000 | ORAL_TABLET | Freq: Every day | ORAL | 11 refills | Status: AC
  Filled 2023-10-08: qty 90, 90d supply, fill #0
  Filled 2023-12-14 – 2024-02-03 (×2): qty 84, 84d supply, fill #0
  Filled 2024-04-01 – 2024-04-28 (×2): qty 84, 84d supply, fill #1

## 2023-10-08 NOTE — Progress Notes (Signed)
 38 y.o. G77P2002 Married White or Caucasian female here for annual exam.  She lives with her spouse and 2 daughters (10 and 2). Her youngest daughter (53) has been diagnosed with a CACNA1A genetic mutation. She experiences neurological and developmental issues (seizures, hemiplegic migraines, autism, etc.). 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 She is temporarily at [facility].  Patient's last menstrual period was 09/30/2023.          Sexually active: Yes.    The current method of family planning is oral progesterone-only contraceptive.      Exercising: Yes.     Smoker:  no  Health Maintenance: Pap:  07/03/2022 Negative History of abnormal Pap:  no MMG:  n/a Colonoscopy:  n/a BMD:   n/a Screening Labs: PCP (Dr. Kandyce Ort)   reports that she has never smoked. She has never used smokeless tobacco. She reports current alcohol  use. She reports that she does not use drugs.  Past Medical History:  Diagnosis Date   Anxiety    Asthma    Bone spur    cervical L side; L side arm numbness intermittent    Depression    Diabetes in pregnancy    Diabetes type 2, controlled (HCC)    Elevated cholesterol with high triglycerides    Gestational diabetes    Hx of varicella    IBS (irritable bowel syndrome)    Kidney stones    followed by Dr. Lorri Rota   Migraines    without aura   Ovarian cyst    Postpartum care following vaginal delivery (1/30) 07/15/2013   Preterm uterine contractions 06/10/2013   Sleep apnea    Vitamin D deficiency     Past Surgical History:  Procedure Laterality Date   MOUTH SURGERY     wisdom teeth extractions    Current Outpatient Medications  Medication Sig Dispense Refill   albuterol  (PROVENTIL  HFA;VENTOLIN  HFA) 108 (90 BASE) MCG/ACT inhaler Inhale 2 puffs into the lungs every 6 (six) hours as needed for wheezing or shortness of breath.     ALPRAZolam  (XANAX ) 0.5 MG tablet Take 1 tablet (0.5 mg total) by mouth 2 (two) times daily as  needed for anxiety 30 tablet 3   atorvastatin  (LIPITOR) 20 MG tablet Take 1 tablet (20 mg total) by mouth daily. 90 tablet 3   baclofen  (LIORESAL ) 10 MG tablet Take 1 tablet (10 mg total) by mouth daily as needed for muscle spasms (acute migraine onset). 30 tablet 11   buPROPion  (WELLBUTRIN  SR) 150 MG 12 hr tablet Take 1 tablet (150 mg total) by mouth daily. 90 tablet 0   citalopram  (CELEXA ) 20 MG tablet Take 1 tablet (20 mg total) by mouth daily. 90 tablet 12   Erenumab -aooe (AIMOVIG ) 140 MG/ML SOAJ Inject 140 mg into the skin every 30 (thirty) days. 1 mL 11   fluticasone  furoate-vilanterol (BREO ELLIPTA ) 200-25 MCG/ACT AEPB Inhale 1 puff into the lungs daily. 60 each 3   gabapentin  (NEURONTIN ) 300 MG capsule Take 1 capsule (300 mg total) by mouth 2 (two) times daily. 180 capsule 3   hydrOXYzine  (ATARAX ) 25 MG tablet Take 1 tablet (25 mg total) by mouth at bedtime as needed. 30 tablet 1   ibuprofen  (ADVIL ) 600 MG tablet Take 600 mg by mouth every 6 (six) hours as needed.     MELATONIN PO Take 3 mg by mouth as needed.     metoCLOPramide  (REGLAN ) 10 MG tablet Take 1 tablet by mouth three times a day while  having migraine or nausea. 90 tablet 5   norethindrone  (MICRONOR ) 0.35 MG tablet Take 1 tablet (0.35 mg total) by mouth daily. 90 tablet 11   ondansetron  (ZOFRAN -ODT) 4 MG disintegrating tablet Dissolve 1 tablet by mouth every 6 hours as needed for nausea or for headache. 30 tablet 0   rizatriptan  (MAXALT -MLT) 10 MG disintegrating tablet DISSOLVE 1 TABLET BY MOUTH AS NEEDED FOR MIGRAINE, MAY REPEAT IN 2 HOURS IF NEEDED 9 tablet 11   Semaglutide , 1 MG/DOSE, 4 MG/3ML SOPN Inject 1 mg as directed once a week. 3 mL 0   indapamide (LOZOL) 1.25 MG tablet TAKE 1 TABLET BY MOUTH ONCE A DAY. 90 tablet 1   Current Facility-Administered Medications  Medication Dose Route Frequency Provider Last Rate Last Admin   triamcinolone  acetonide (KENALOG -40) injection 10 mg  10 mg Intramuscular Once Arma Lamp, MD        Family History  Problem Relation Age of Onset   Hypertension Mother    Hypothyroidism Mother    Heart attack Mother    Diabetes Mother    Heart disease Mother        heart failure   Dementia Mother    Kidney disease Mother    Sleep apnea Mother    Hypertension Father    Hypothyroidism Father    Diabetes Father    Obesity Father    Sleep apnea Father    Cancer Maternal Grandmother        colon   Dementia Maternal Grandmother    Hypertension Maternal Grandmother    Colon cancer Maternal Grandmother    Early death Maternal Grandfather 44       ?pulmonary disease   Pulmonary fibrosis Maternal Grandfather    Seizures Daughter        CACNA1A mutation (seizures, hemiplegic migraines, autism, etc.)   Neurologic Disorder Daughter    Developmental delay Daughter     ROS: Constitutional: negative Genitourinary:negative  Exam:   BP (!) 118/51 (BP Location: Right Arm, Patient Position: Sitting, Cuff Size: Large)   Pulse 73   Ht 5' 2.5" (1.588 m)   Wt 206 lb 3.2 oz (93.5 kg)   LMP 09/30/2023   BMI 37.11 kg/m   Height: 5' 2.5" (158.8 cm)  General appearance: alert, cooperative and appears stated age Head: Normocephalic, without obvious abnormality, atraumatic Lungs: clear to auscultation bilaterally Breasts: normal appearance, no masses or tenderness, Inspection negative, No nipple retraction or dimpling, No nipple discharge or bleeding, No axillary or supraclavicular adenopathy, Normal to palpation without dominant masses Heart: regular rate and rhythm Abdomen: soft, non-tender; bowel sounds normal; no masses,  no organomegaly Extremities: extremities normal, atraumatic, no cyanosis or edema Skin: Skin color, texture, turgor normal. No rashes or lesions Lymph nodes: Cervical, supraclavicular, and axillary nodes normal. No abnormal inguinal nodes palpated Neurologic: Grossly normal   Pelvic: External genitalia:  no lesions              Urethra:   normal appearing urethra with no masses, tenderness or lesions              Bartholins and Skenes: normal                 Vagina: normal appearing vagina with normal color and no discharge, no lesions; +rectocele              Cervix: multiparous appearance              Pap taken: No. Bimanual Exam:  Uterus:  normal size, contour, position, consistency, mobility, non-tender              Adnexa: no mass, fullness, tenderness               Rectovaginal: Confirms               Anus:  normal sphincter tone, no lesions  Chaperone,  CMA, was present for exam.  Assessment/Plan:  1. Encounter for annual routine gynecological examination (Primary) - Pap smear UTD 2024, next pap due 2026 - breast self awareness encouraged  2. Rectocele - Pt has met with Dr. Fr[facility]oso [facility]) previously and knows surgery may be an option  3. Surveillance of contraceptive pill - Pt satisfied with POP and desires to continue.  Pt would like to decrease Celexa  from 40mg  to 20mg  po daily since she is now on Wellbutrin  to help with weight loss. RTO 1 year for annual gyn exam and prn if issues arise.  Yolanda Hence

## 2023-10-14 ENCOUNTER — Ambulatory Visit (HOSPITAL_BASED_OUTPATIENT_CLINIC_OR_DEPARTMENT_OTHER): Admitting: Obstetrics & Gynecology

## 2023-10-24 DIAGNOSIS — G4733 Obstructive sleep apnea (adult) (pediatric): Secondary | ICD-10-CM | POA: Diagnosis not present

## 2023-10-27 ENCOUNTER — Other Ambulatory Visit (HOSPITAL_COMMUNITY): Payer: Self-pay

## 2023-10-27 ENCOUNTER — Other Ambulatory Visit (HOSPITAL_BASED_OUTPATIENT_CLINIC_OR_DEPARTMENT_OTHER): Payer: Self-pay | Admitting: Obstetrics & Gynecology

## 2023-10-27 ENCOUNTER — Other Ambulatory Visit: Payer: Self-pay

## 2023-10-27 DIAGNOSIS — F419 Anxiety disorder, unspecified: Secondary | ICD-10-CM

## 2023-10-28 ENCOUNTER — Other Ambulatory Visit: Payer: Self-pay

## 2023-10-28 ENCOUNTER — Other Ambulatory Visit (HOSPITAL_COMMUNITY): Payer: Self-pay

## 2023-10-28 MED ORDER — HYDROXYZINE HCL 25 MG PO TABS
25.0000 mg | ORAL_TABLET | Freq: Every evening | ORAL | 1 refills | Status: DC | PRN
Start: 2023-10-28 — End: 2024-02-03
  Filled 2023-10-28: qty 30, 30d supply, fill #0
  Filled 2023-12-14 – 2023-12-23 (×2): qty 30, 30d supply, fill #1

## 2023-11-02 ENCOUNTER — Ambulatory Visit: Admitting: Nurse Practitioner

## 2023-11-02 ENCOUNTER — Other Ambulatory Visit (HOSPITAL_COMMUNITY): Payer: Self-pay

## 2023-11-02 DIAGNOSIS — R0689 Other abnormalities of breathing: Secondary | ICD-10-CM | POA: Diagnosis not present

## 2023-11-03 ENCOUNTER — Encounter: Payer: Self-pay | Admitting: Family Medicine

## 2023-11-03 ENCOUNTER — Ambulatory Visit: Admitting: Family Medicine

## 2023-11-03 ENCOUNTER — Other Ambulatory Visit: Payer: Self-pay

## 2023-11-03 ENCOUNTER — Other Ambulatory Visit (HOSPITAL_COMMUNITY): Payer: Self-pay

## 2023-11-03 VITALS — BP 106/71 | HR 95 | Temp 98.0°F | Ht 62.0 in | Wt 204.0 lb

## 2023-11-03 DIAGNOSIS — R638 Other symptoms and signs concerning food and fluid intake: Secondary | ICD-10-CM

## 2023-11-03 DIAGNOSIS — Z7985 Long-term (current) use of injectable non-insulin antidiabetic drugs: Secondary | ICD-10-CM | POA: Diagnosis not present

## 2023-11-03 DIAGNOSIS — R110 Nausea: Secondary | ICD-10-CM | POA: Diagnosis not present

## 2023-11-03 DIAGNOSIS — E66812 Obesity, class 2: Secondary | ICD-10-CM | POA: Diagnosis not present

## 2023-11-03 DIAGNOSIS — Z6837 Body mass index (BMI) 37.0-37.9, adult: Secondary | ICD-10-CM | POA: Diagnosis not present

## 2023-11-03 DIAGNOSIS — E119 Type 2 diabetes mellitus without complications: Secondary | ICD-10-CM | POA: Diagnosis not present

## 2023-11-03 MED ORDER — ONDANSETRON 4 MG PO TBDP
4.0000 mg | ORAL_TABLET | Freq: Four times a day (QID) | ORAL | 0 refills | Status: DC | PRN
  Filled 2023-11-03: qty 30, 8d supply, fill #0

## 2023-11-03 MED ORDER — SEMAGLUTIDE (1 MG/DOSE) 4 MG/3ML ~~LOC~~ SOPN
1.0000 mg | PEN_INJECTOR | SUBCUTANEOUS | 0 refills | Status: DC
  Filled 2023-11-03: qty 3, 28d supply, fill #0

## 2023-11-03 NOTE — Patient Instructions (Addendum)
 Increase calories 1500-1600 per day This should include 100 g of protein per day  Hydrate well with water Add miralax prn for constipation  Fruit - allow 2 servings per day -- if at night, pick berries, apples or pears  Remember your veggies! - raw or cooked  Think about adding in outdoor walking with a goal of 30 min 4-5 days/ wk  Move Celexa  to 8 pm daily

## 2023-11-03 NOTE — Progress Notes (Signed)
 Office: [phone]  /  Fax: [phone]  WEIGHT SUMMARY AND BIOMETRICS  Starting Date: 09/17/22  Starting Weight: 208lb   Weight Lost Since Last Visit: 2lb   Vitals Temp: 98 F (36.7 C) BP: 106/71 Pulse Rate: 95 SpO2: 97 %   Body Composition  Body Fat %: 43.2 % Fat Mass (lbs): 88.4 lbs Muscle Mass (lbs): 110.6 lbs Total Body Water (lbs): 79 lbs Visceral Fat Rating : 10    HPI  Chief Complaint: OBESITY  Abigail Frank is here to discuss her progress with her obesity treatment plan. She is on the keeping a food journal and adhering to recommended goals of 1200-1500 calories and 90-120 protein and states she is following her eating plan approximately 75 % of the time. She states she is exercising 30 minutes 7 times per week.  Interval History:  Since last office visit she is down 2 lb She has reduced nighttime snacking with the addition of Wellbutrin  SR 150 mg in the mornings She is feeling a bit less satiety with Ozempic  1 mg weekly esp day 5 and 6 post injection She is prioritizing lean protein intake She needs to work on water intake, struggling to get in enough vegetables, consuming at least 2 fresh fruit servings daily She has breakfast before work and doesn't get a break until 2-3 pm and then eats dinner around 7pm at work She is applying for a new job with more regular hours She is working out at home with a walking pad and weights for 30 min daily She has had a trip to [place] since her last visit She now has a net weight loss of 4 pounds in the past 13 months of medically supervised weight management  Pharmacotherapy: Ozempic  1 mg weekly  PHYSICAL EXAM:  Blood pressure 106/71, pulse 95, temperature 98 F (36.7 C), height 5\' 2"  (1.575 m), weight 204 lb (92.5 kg), last menstrual period 09/30/2023, SpO2 97%. Body mass index is 37.31 kg/m.  General: She is overweight, cooperative, alert, well developed, and in no acute distress. PSYCH: Has normal mood, affect  and thought process.   Lungs: Normal breathing effort, no conversational dyspnea.   ASSESSMENT AND PLAN  TREATMENT PLAN FOR OBESITY:  Recommended Dietary Goals  Yaslin is currently in the action stage of change. As such, her goal is to continue weight management plan. She has agreed to keeping a food journal and adhering to recommended goals of 1500-1600 calories and 100 g of protein. Adjusted her calorie target up given her last resting energy expenditure  Behavioral Intervention  We discussed the following Behavioral Modification Strategies today: increasing lean protein intake to established goals, increasing fiber rich foods, increasing water intake , work on tracking and journaling calories using tracking application, reading food labels , keeping healthy foods at home, identifying sources and decreasing liquid calories, avoiding temptations and identifying enticing environmental cues, continue to practice mindfulness when eating, planning for success, better snacking choices, and continue to work on maintaining a reduced calorie state, getting the recommended amount of protein, incorporating whole foods, making healthy choices, staying well hydrated and practicing mindfulness when eating..  Additional resources provided today: NA  Recommended Physical Activity Goals  Tameka has been advised to work up to 150 minutes of moderate intensity aerobic activity a week and strengthening exercises 2-3 times per week for cardiovascular health, weight loss maintenance and preservation of muscle mass.   She has agreed to Increase the intensity, frequency or duration of strengthening exercises  and Increase  the intensity, frequency or duration of aerobic exercises   Recommend outdoor walking with a goal of 30 minutes 5 days a week as her time is limited on a walking pad to about 10 minutes due to feeling like she will fall off.  Continue at home resistance training exercises 15 minutes 3 days  a week  Pharmacotherapy changes for the treatment of obesity: None  ASSOCIATED CONDITIONS ADDRESSED TODAY  Controlled type 2 diabetes mellitus without complication, without long-term current use of insulin  (HCC) Lab Results  Component Value Date   HGBA1C 6.2 (H) 10/31/2019  She is overdue for an A1c.  She has good control of her type 2 diabetes based on her last readings.  She is not consistently checking blood sugars at home.  She is tolerating Ozempic  well with occasional nausea.  She is having some breakthrough hunger on day 5-6 postinjection.  We discussed tracking of daily calories, increasing water intake, increasing vegetable intake and hitting protein target of 100 g/day.  She has room for improvement with more consistent exercise.  -     Semaglutide  (1 MG/DOSE); Inject 1 mg as directed once a week.  Dispense: 3 mL; Refill: 0  Nausea Improved using Zofran  as needed in addition to avoiding high fat/high sugar foods -     Ondansetron ; Dissolve 1 tablet by mouth every 6 hours as needed for nausea or for headache.  Dispense: 30 tablet; Refill: 0  Class 2 severe obesity due to excess calories with serious comorbidity and body mass index (BMI) of 37.0 to 37.9 in adult [facility])  Abnormal craving Improved with addition of Wellbutrin  SR 150 mg daily     She was informed of the importance of frequent follow up visits to maximize her success with intensive lifestyle modifications for her multiple health conditions.   ATTESTASTION STATEMENTS:  Reviewed by clinician on day of visit: allergies, medications, problem list, medical history, surgical history, family history, social history, and previous encounter notes pertinent to obesity diagnosis.   I have personally spent 30 minutes total time today in preparation, patient care, nutritional counseling and education,  and documentation for this visit, including the following: review of most recent clinical lab tests, prescribing medications/  refilling medications, reviewing medical assistant documentation, review and interpretation of bioimpedence results.     Micky Albee, D.O. DABFM, DABOM [facility] [address] [place], [place] [address] [phone]

## 2023-12-07 ENCOUNTER — Ambulatory Visit: Admitting: Nurse Practitioner

## 2023-12-08 ENCOUNTER — Ambulatory Visit: Admitting: Nurse Practitioner

## 2023-12-08 ENCOUNTER — Other Ambulatory Visit: Payer: Self-pay

## 2023-12-08 ENCOUNTER — Encounter: Payer: Self-pay | Admitting: Nurse Practitioner

## 2023-12-08 VITALS — BP 116/77 | HR 87 | Temp 98.6°F | Ht 62.0 in | Wt 202.0 lb

## 2023-12-08 DIAGNOSIS — E66812 Obesity, class 2: Secondary | ICD-10-CM

## 2023-12-08 DIAGNOSIS — E538 Deficiency of other specified B group vitamins: Secondary | ICD-10-CM | POA: Diagnosis not present

## 2023-12-08 DIAGNOSIS — Z6836 Body mass index (BMI) 36.0-36.9, adult: Secondary | ICD-10-CM

## 2023-12-08 DIAGNOSIS — R5383 Other fatigue: Secondary | ICD-10-CM | POA: Diagnosis not present

## 2023-12-08 DIAGNOSIS — K121 Other forms of stomatitis: Secondary | ICD-10-CM

## 2023-12-08 DIAGNOSIS — E119 Type 2 diabetes mellitus without complications: Secondary | ICD-10-CM

## 2023-12-08 DIAGNOSIS — E7849 Other hyperlipidemia: Secondary | ICD-10-CM

## 2023-12-08 DIAGNOSIS — E559 Vitamin D deficiency, unspecified: Secondary | ICD-10-CM

## 2023-12-08 DIAGNOSIS — Z79899 Other long term (current) drug therapy: Secondary | ICD-10-CM

## 2023-12-08 DIAGNOSIS — Z7985 Long-term (current) use of injectable non-insulin antidiabetic drugs: Secondary | ICD-10-CM

## 2023-12-08 MED ORDER — SEMAGLUTIDE (1 MG/DOSE) 4 MG/3ML ~~LOC~~ SOPN
1.0000 mg | PEN_INJECTOR | SUBCUTANEOUS | 0 refills | Status: DC
  Filled 2023-12-08 – 2023-12-14 (×2): qty 3, 28d supply, fill #0

## 2023-12-08 MED ORDER — LIDOCAINE VISCOUS HCL 2 % MT SOLN
5.0000 mL | Freq: Four times a day (QID) | OROMUCOSAL | 0 refills | Status: DC | PRN
  Filled 2023-12-08: qty 360, 18d supply, fill #0

## 2023-12-08 NOTE — Progress Notes (Signed)
 Office: [phone]  /  Fax: [phone]  WEIGHT SUMMARY AND BIOMETRICS  Weight Lost Since Last Visit: 2lb  Weight Gained Since Last Visit: 0lb   Vitals Temp: 98.6 F (37 C) BP: 116/77 Pulse Rate: 87 SpO2: 97 %   Anthropometric Measurements Height: 5' 2 (1.575 m) Weight: 202 lb (91.6 kg) BMI (Calculated): 36.94 Weight at Last Visit: 204lb Weight Lost Since Last Visit: 2lb Weight Gained Since Last Visit: 0lb Starting Weight: 208lb Total Weight Loss (lbs): 6 lb (2.722 kg)   Body Composition  Body Fat %: 43.7 % Fat Mass (lbs): 88.4 lbs Muscle Mass (lbs): 108.2 lbs Total Body Water (lbs): 78.6 lbs Visceral Fat Rating : 10   Other Clinical Data Fasting: No Labs: No Today's Visit #: 16 Starting Date: 09/17/22     HPI  Chief Complaint: OBESITY  Abigail Frank is here to discuss her progress with her obesity treatment plan. She is on the keeping a food journal and adhering to recommended goals of 1600 calories and 90-120 protein and states she is following her eating plan approximately 100 % of the time. She states she is exercising 30 minutes 7 days per week.   Interval History:  Since last office visit she has lost 2 pounds.  She bite her tongue Sunday night and has developed a painful ulcer and hasn't been able to eat a regular diet since Sunday.  Notes some polyphaiga and cravings on day 5-6 postinjection. She has been trying to increase her protein intake.  She has an under the desk treadmill at work but hasn't started using it yet.  She is using an under the desk bike daily at work.  She is doing daily resistance training for 10 mins.  She is drinking water and a protein shake daily.    She is going on a cruise to [place] in July   Pharmacotherapy for weight loss: She is currently taking Wellbutrin  SR 150mg  off label for cravings.  Denies side effects. Has helped with nighttime cravings and has helped to increase her energy levels.  Her mood is overall better      Previous pharmacotherapy for medical weight loss:  None   Bariatric surgery:  Patient has not had bariatric surgery  Pharmacotherapy for DMT2:   She is currently taking Ozempic  1mg . Notes occ nausea-maybe every 2 weeks. Takes Zofran  PRN.     Last A1c was 6.1-at PCP's office on March 17th She is not checking BS at home.   Episodes of hypoglycemia: no Taking Lipitor 20mg  daily.    Last eye exam:  Oct 2024 She has tried Ozempic  and Glyburide in the past. She struggles with polyphagia and cravings on day 5-6 post injection.    Lab Results  Component Value Date   HGBA1C 6.2 (H) 10/31/2019   Lab Results  Component Value Date   LDLCALC 104 (H) 09/17/2022   CREATININE 0.72 07/21/2023    Vit D deficiency  She is not currently taking Vit D or mvi. Reports a history of low Vit D. Reports fatigue.   No results found for: VD25OH   Vit B12 def Not currently taking Vit B12.  Has taken Vit B12 po in the past. Reports fatigue.    PHYSICAL EXAM:  Blood pressure 116/77, pulse 87, temperature 98.6 F (37 C), height 5' 2 (1.575 m), weight 202 lb (91.6 kg), last menstrual period 12/08/2023, SpO2 97%. Body mass index is 36.95 kg/m.  General: She is overweight, cooperative, alert, well developed, and in no  acute distress. PSYCH: Has normal mood, affect and thought process.   Extremities: No edema.  Neurologic: No gross sensory or motor deficits. No tremors or fasciculations noted.   Eraser size ulcer noted on right lateral tongue  DIAGNOSTIC DATA REVIEWED:  BMET    Component Value Date/Time   NA 142 07/21/2023 1037   K 4.6 07/21/2023 1037   CL 102 07/21/2023 1037   CO2 26 07/21/2023 1037   GLUCOSE 100 (H) 07/21/2023 1037   GLUCOSE 151 (H) 06/16/2017 2322   BUN 11 07/21/2023 1037   CREATININE 0.72 07/21/2023 1037   CALCIUM  9.4 07/21/2023 1037   GFRNONAA 115 10/31/2019 1518   GFRAA 132 10/31/2019 1518   Lab Results  Component Value Date   HGBA1C 6.2 (H) 10/31/2019   Lab  Results  Component Value Date   INSULIN  21.5 09/17/2022   Lab Results  Component Value Date   TSH 2.280 09/17/2022   CBC    Component Value Date/Time   WBC 10.9 (H) 12/03/2022 1454   WBC 8.5 07/14/2017 1209   RBC 5.03 12/03/2022 1454   RBC 5.12 (H) 07/14/2017 1209   HGB 13.5 12/03/2022 1454   HCT 42.3 12/03/2022 1454   PLT 325 12/03/2022 1454   MCV 84 12/03/2022 1454   MCH 26.8 12/03/2022 1454   MCH 27.9 06/16/2017 2322   MCHC 31.9 12/03/2022 1454   MCHC 33.6 07/14/2017 1209   RDW 13.2 12/03/2022 1454   Iron Studies    Component Value Date/Time   IRON 40 10/31/2019 1518   TIBC 333 10/31/2019 1518   FERRITIN 60 10/31/2019 1518   IRONPCTSAT 12 (L) 10/31/2019 1518   Lipid Panel     Component Value Date/Time   CHOL 181 09/17/2022 1042   TRIG 213 (H) 09/17/2022 1042   HDL 40 09/17/2022 1042   LDLCALC 104 (H) 09/17/2022 1042   Hepatic Function Panel     Component Value Date/Time   PROT 6.6 07/21/2023 1037   ALBUMIN 4.3 07/21/2023 1037   AST 18 07/21/2023 1037   ALT 17 07/21/2023 1037   ALKPHOS 94 07/21/2023 1037   BILITOT <0.2 07/21/2023 1037      Component Value Date/Time   TSH 2.280 09/17/2022 1042   Nutritional No results found for: VD25OH   ASSESSMENT AND PLAN  TREATMENT PLAN FOR OBESITY:  Recommended Dietary Goals  Tedi is currently in the action stage of change. As such, her goal is to continue weight management plan. She has agreed to keeping a food journal and adhering to recommended goals of 1600 calories and 100+ grams of protein.  Behavioral Intervention  We discussed the following Behavioral Modification Strategies today: increasing lean protein intake to established goals, decreasing simple carbohydrates , increasing vegetables, increasing fiber rich foods, increasing water intake , and continue to work on maintaining a reduced calorie state, getting the recommended amount of protein, incorporating whole foods, making healthy  choices, staying well hydrated and practicing mindfulness when eating..  Additional resources provided today: NA  Recommended Physical Activity Goals  Anuradha has been advised to work up to 150 minutes of moderate intensity aerobic activity a week and strengthening exercises 2-3 times per week for cardiovascular health, weight loss maintenance and preservation of muscle mass.   She has agreed to Think about enjoyable ways to increase daily physical activity and overcoming barriers to exercise, Increase physical activity in their day and reduce sedentary time (increase NEAT)., and continue to gradually increase the amount and intensity of exercise routine  ASSOCIATED CONDITIONS ADDRESSED TODAY  Action/Plan  Mouth ulcer -     magic mouthwash (lidocaine , diphenhydrAMINE , alum & mag hydroxide) suspension; Swish and spit 5 mLs 4 (four) times daily as needed for mouth pain.  Dispense: 360 mL; Refill: 0.  Side effects discussed  If worsens or persist to let me know.   Controlled type 2 diabetes mellitus without complication, without long-term current use of insulin  (HCC) -     Semaglutide  (1 MG/DOSE); Inject 1 mg under the skin as directed once a week.  Dispense: 3 mL; Refill: 0. Side effects discussed.    Vitamin D deficiency -     VITAMIN D 25 Hydroxy (Vit-D Deficiency, Fractures)  Vitamin B 12 deficiency -     Vitamin B12  Other hyperlipidemia -     Lipid Panel With LDL/HDL Ratio  Continue to follow up with PCP. Continue meds as directed.   Medication management -     Comprehensive metabolic panel with GFR -     CBC with Differential/Platelet -     TSH  Other fatigue -     Comprehensive metabolic panel with GFR -     CBC with Differential/Platelet -     TSH -     VITAMIN D 25 Hydroxy (Vit-D Deficiency, Fractures) -     Vitamin B12  Class 2 severe obesity due to excess calories with serious comorbidity and body mass index (BMI) of 36.0 to 36.9 in adult [facility])         Return in about 5 weeks (around 01/12/2024).SABRA She was informed of the importance of frequent follow up visits to maximize her success with intensive lifestyle modifications for her multiple health conditions.   ATTESTASTION STATEMENTS:  Reviewed by clinician on day of visit: allergies, medications, problem list, medical history, surgical history, family history, social history, and previous encounter notes.     Corean SAUNDERS. Jenille Laszlo FNP-C

## 2023-12-09 ENCOUNTER — Other Ambulatory Visit: Payer: Self-pay

## 2023-12-09 ENCOUNTER — Other Ambulatory Visit (HOSPITAL_COMMUNITY): Payer: Self-pay

## 2023-12-09 ENCOUNTER — Encounter: Payer: Self-pay | Admitting: Pharmacist

## 2023-12-10 ENCOUNTER — Encounter: Payer: Self-pay | Admitting: Nurse Practitioner

## 2023-12-10 ENCOUNTER — Other Ambulatory Visit (HOSPITAL_COMMUNITY): Payer: Self-pay

## 2023-12-14 ENCOUNTER — Other Ambulatory Visit: Payer: Self-pay

## 2023-12-14 ENCOUNTER — Other Ambulatory Visit (HOSPITAL_COMMUNITY): Payer: Self-pay

## 2023-12-17 DIAGNOSIS — Z79899 Other long term (current) drug therapy: Secondary | ICD-10-CM | POA: Diagnosis not present

## 2023-12-17 DIAGNOSIS — R5383 Other fatigue: Secondary | ICD-10-CM | POA: Diagnosis not present

## 2023-12-17 DIAGNOSIS — E559 Vitamin D deficiency, unspecified: Secondary | ICD-10-CM | POA: Diagnosis not present

## 2023-12-17 DIAGNOSIS — E538 Deficiency of other specified B group vitamins: Secondary | ICD-10-CM | POA: Diagnosis not present

## 2023-12-17 DIAGNOSIS — E7849 Other hyperlipidemia: Secondary | ICD-10-CM | POA: Diagnosis not present

## 2023-12-18 LAB — COMPREHENSIVE METABOLIC PANEL WITH GFR
ALT: 22 IU/L (ref 0–32)
AST: 17 IU/L (ref 0–40)
Albumin: 4.3 g/dL (ref 3.9–4.9)
Alkaline Phosphatase: 92 IU/L (ref 44–121)
BUN/Creatinine Ratio: 16 (ref 9–23)
BUN: 12 mg/dL (ref 6–20)
Bilirubin Total: <0.2 mg/dL (ref 0.0–1.2)
CO2: 21 mmol/L (ref 20–29)
Calcium: 9.4 mg/dL (ref 8.7–10.2)
Chloride: 103 mmol/L (ref 96–106)
Creatinine, Ser: 0.74 mg/dL (ref 0.57–1.00)
Globulin, Total: 2.4 g/dL (ref 1.5–4.5)
Glucose: 104 mg/dL — ABNORMAL HIGH (ref 70–99)
Potassium: 4.7 mmol/L (ref 3.5–5.2)
Sodium: 141 mmol/L (ref 134–144)
Total Protein: 6.7 g/dL (ref 6.0–8.5)
eGFR: 107 mL/min/1.73 (ref >59)

## 2023-12-18 LAB — CBC WITH DIFFERENTIAL/PLATELET
Basophils Absolute: 0 x10E3/uL (ref 0.0–0.2)
Basos: 0 %
EOS (ABSOLUTE): 0.1 x10E3/uL (ref 0.0–0.4)
Eos: 2 %
Hematocrit: 45.3 % (ref 34.0–46.6)
Hemoglobin: 13.7 g/dL (ref 11.1–15.9)
Immature Grans (Abs): 0 x10E3/uL (ref 0.0–0.1)
Immature Granulocytes: 0 %
Lymphocytes Absolute: 2 x10E3/uL (ref 0.7–3.1)
Lymphs: 29 %
MCH: 27.1 pg (ref 26.6–33.0)
MCHC: 30.2 g/dL — ABNORMAL LOW (ref 31.5–35.7)
MCV: 90 fL (ref 79–97)
Monocytes Absolute: 0.5 x10E3/uL (ref 0.1–0.9)
Monocytes: 7 %
Neutrophils Absolute: 4.3 x10E3/uL (ref 1.4–7.0)
Neutrophils: 62 %
Platelets: 289 x10E3/uL (ref 150–450)
RBC: 5.06 x10E6/uL (ref 3.77–5.28)
RDW: 13.4 % (ref 11.7–15.4)
WBC: 7 x10E3/uL (ref 3.4–10.8)

## 2023-12-18 LAB — LIPID PANEL WITH LDL/HDL RATIO
Cholesterol, Total: 160 mg/dL (ref 100–199)
HDL: 39 mg/dL — ABNORMAL LOW (ref >39)
LDL Chol Calc (NIH): 90 mg/dL (ref 0–99)
LDL/HDL Ratio: 2.3 ratio (ref 0.0–3.2)
Triglycerides: 181 mg/dL — ABNORMAL HIGH (ref 0–149)
VLDL Cholesterol Cal: 31 mg/dL (ref 5–40)

## 2023-12-18 LAB — VITAMIN D 25 HYDROXY (VIT D DEFICIENCY, FRACTURES): Vit D, 25-Hydroxy: 30.9 ng/mL (ref 30.0–100.0)

## 2023-12-18 LAB — VITAMIN B12: Vitamin B-12: 627 pg/mL (ref 232–1245)

## 2023-12-18 LAB — TSH: TSH: 1.66 u[IU]/mL (ref 0.450–4.500)

## 2023-12-21 ENCOUNTER — Other Ambulatory Visit (HOSPITAL_COMMUNITY): Payer: Self-pay

## 2023-12-22 ENCOUNTER — Encounter: Payer: Self-pay | Admitting: Adult Health

## 2023-12-22 ENCOUNTER — Other Ambulatory Visit: Payer: Self-pay

## 2023-12-22 ENCOUNTER — Ambulatory Visit: Admitting: Adult Health

## 2023-12-22 ENCOUNTER — Other Ambulatory Visit (HOSPITAL_COMMUNITY): Payer: Self-pay

## 2023-12-22 VITALS — BP 113/67 | HR 84 | Ht 62.0 in | Wt 209.0 lb

## 2023-12-22 DIAGNOSIS — G43909 Migraine, unspecified, not intractable, without status migrainosus: Secondary | ICD-10-CM | POA: Diagnosis not present

## 2023-12-22 DIAGNOSIS — G43709 Chronic migraine without aura, not intractable, without status migrainosus: Secondary | ICD-10-CM | POA: Diagnosis not present

## 2023-12-22 MED ORDER — KETOROLAC TROMETHAMINE 60 MG/2ML IM SOLN
60.0000 mg | Freq: Once | INTRAMUSCULAR | Status: AC
  Administered 2023-12-22: 60 mg via INTRAMUSCULAR

## 2023-12-22 MED ORDER — METHYLPREDNISOLONE 4 MG PO TBPK
ORAL_TABLET | ORAL | 0 refills | Status: DC
  Filled 2023-12-22 (×2): qty 21, 6d supply, fill #0

## 2023-12-22 NOTE — Progress Notes (Signed)
 [facility]    Provider:  Dr Ines Requesting Provider: Tisovec, Charlie ORN, MD Primary Care Provider:  Tisovec, Richard W, MD    CC:  Chief Complaint  Patient presents with   Follow-up    Pt alone, rm 4 states that she has had to take a maxalt  daily. She said fri she started having headache and she has had to take maxalt . States it has not disappeared.       HPI:   Update 12/22/2023 JM: Patient returns for acute visit.  Reports having a persistent migraine over the past 3 days, typical presentation, right occiput, behind right eye, pressure sensation.  She has tried her normal cocktail of rizatriptan , baclofen , ibuprofen  and gabapentin  which has helped only minimally.  She questions receiving Toradol  injection.  Last use of ibuprofen  yesterday. Reports previously doing a steroid taper pack, glucose levels can be slightly increased the first day but then stabilize, otherwise tolerates well. Migraines have been well controlled overall on Aimovig  and rescue medication typically works well. Believes current migraine was triggered from heat (was at a parade on July 4th) and changes in barometric pressure.       History provided for reference purposes only Update 04/15/2023 JM: Patient returns for follow-up visit previously seen by Amy, NP in 10/2022.  Previously, she was diagnosed with moderate OSA with total AHI 18.6/h and O2 nadir 75% and placed on AutoPAP therapy but noted difficulty tolerating CPAP resulting in suboptimal compliance at 13% >4 hr usage, recommended mask refitting.  Migraines remained well-controlled on current regimen.  Currently, migraines well-controlled. Remains on Aimovig  monthly injection and gabapentin  300mg  nightly for prevention. Reports 2-3 migraines per month, usually with menstrual cycle. Takes a cocktail of rizatriptan , baclofen , ibuprofen , gabapentin  300mg  cap with great benefit.   Being followed by [facility] for  weight loss assistance, working on Ozempic  coverage through insurance.  CPAP compliance report over the past 30 days shows 17 out of 30 usage days with 11 days greater than 4 hours for 37% compliance.  Residual AHI 2.1.  Pressure in the 95 percentile 9.1 pressure setting of 5-12 with EPR 3.  Leaks in the 9th percentile 0.8.  Trying to work on using more consistently, tolerance gradually improving. She did not get a mask refit, she was able to tighten her mask and leaks improved. Does note benefit with use.  Routinely follows with DME company and up to date on supplies. No questions or concerns today.   Update 10/24/2021 JM: Patient returns for 1 year migraine follow-up.  Overall doing well.  Migraines remain well controlled on Aimovig . She has been experiencing a migraine headache about 1x/week typically upon awakening over the past 6 months.  Previously, would experience about 1 migraine per week but randomly throughout the day.  Pain remains behind right eye and right occipital area.  Will take a rizatriptan  and fall asleep for another hour and typically migraine will be resolved.  If severe, will take Zofran  and baclofen  in addition to rizatriptan  with resolution.  Upon further questioning, she does admit to insomnia waking up frequently throughout the night and difficulty falling asleep, fatigue during the day with naps, and snores.  She has not previously underwent sleep study.  She is currently working on weight loss and has lost about 30 pounds since prior visit.  No further concerns at this time.  Update 11/05/2020 JM: Ms. Duford returns for yearly migraine follow-up  Migraines have been well controlled with monthly  use of Aimovig .  She has only been experiencing migraines during her monthly cycle and with increased neck pain.  Reports her PCP recently referred her to [facility] for a bone spur at C2-3 and possible nerve ablation per patient-initial evaluation next week.  Use of Maxalt   with some benefit but usually will take up to 2 hours to take effect.  Trialed Nurtec with quick resolution when able to catch early enough. Will also use Zofran  and baclofen  with benefit.  Will occasionally use tizadine and Reglan  but does cause excessive fatigue.   Update 11/03/2019 JM: Ms. Joens returns for migraine follow-up.  Current migraine treatment plan includes  Prevention: Aimovig  140 mg SQ monthly, Emergent as needed: ubrelvy 50 mg as needed and baclofen ; if severe unable to sleep, rizatriptan  10 mg and tizanidine  4 mg.  Zofran  4 mg as needed for nausea  Reports severe migraine in 05/2019 with typical migraine onset and location but accompanied by slurred speech, right arm heaviness and unsteadiness.  She has not previously experienced the symptoms.  Pain rated 10/10.  She ended up falling asleep after taking rizatriptan  and tizanidine  and after awakening 4 hours later, symptoms resolved including migraine but did continue to feel spaced out sensation which is typical for her.  She has not experienced recurrent neurological symptoms since that time.  During the month of March and April, she was experiencing migraines 3-4 times weekly lasting 4 to 6 hours without benefit of emergent medications.  She does admit to missing Aimovig  injections in January and February due to home stressors.  Restarted monthly Aimovig  in March and migraines have greatly improved since that time.  She does endorse ongoing bilateral neck pain and feels as though this contributes to migraine onset.  She has trialed massage in the past without great benefit.   Interval history 11/24/2018 Dr. Ines:  Follow up for migraines and new symptom hand pain.  Patient reports for several years she has had hand pain numbness and tingling mostly in digits 1 through 3 more so on the left but also on the right.  She wakes up in the middle of the night and has to shake her hands out.  She had an MRI of the cervical spine in 2017  due to the symptoms but did not show etiology.  It is worsening.  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  Positive Phalen's sign.  I discussed carpal tunnel syndrome with patient and we will have her in for an EMG nerve conduction study.  She is doing amazing on Aimovig  she loves it continue.  Personally reviewed MRI of th cervical spine and agree with the following 2017:   IMPRESSION: Negative for central canal stenosis. Uncovertebral spurring results in some foraminal narrowing on the left appearing most notable at C3-4 with a very mild degree of foraminal narrowing on the left at C4-5 and C5-6.   Initial consult visit 10/07/2018 Dr. Ines:  Corean I Smitherman is a 38 y.o. female here as requested by Tisovec, Charlie ORN, MD for migraines. PMHx migraine. No family history that she knows of, her mother is deceased. Struggling for years. Started during her menses as a teenager and have worsened over the years. Worse with life stressors. At least 2-3 migraines. She has 25 headache days a month. At least 12 migraine days a month that can be moderately severe or severe. Pounding/pulsating, interrupts her daily life, she has to lay down, turn off the light, nausea,  no vomiting, +photo/phonophbia, Can last 24-72 hours. No medication overuse. No aura. Ongoing for over a year. Migraines starts as tension in the neck and creep up the back of her neck, pins and needles, to the right and then all over, pain behind the right eye, right side is the worse side. They can be positional. She wakes with them in the morning. Her left arm goes numb, tried a steroid pack. She follows with an orthopaedist for her neck. Positions of her head can cause the headaches. Neck pain and tightness. No other focal neurologic deficits, associated symptoms, inciting events or modifiable factors.  Meds tried: zoloft. Topamax(made her crazy), ubrelvy, baclofen , imitrex..      Review of Systems: Patient complains of symptoms per  HPI as well as the following symptoms: Headaches.  Pertinent negatives and positives per HPI. All others negative.   Social History   Socioeconomic History   Marital status: Married    Spouse name: Not on file   Number of children: 2   Years of education: Not on file   Highest education level: Bachelor's degree (e.g., BA, AB, BS)  Occupational History   Not on file  Tobacco Use   Smoking status: Never   Smokeless tobacco: Never  Vaping Use   Vaping status: Never Used  Substance and Sexual Activity   Alcohol  use: Yes    Comment: on occasion a glass of wine   Drug use: Never   Sexual activity: Yes    Birth control/protection: OCP  Other Topics Concern   Not on file  Social History Narrative   Lives at home with her family   Left handed   Caffeine: regular, at least 2 cups of coffee daily   Social Drivers of Corporate investment banker Strain: Not on file  Food Insecurity: Not on file  Transportation Needs: Not on file  Physical Activity: Not on file  Stress: Not on file  Social Connections: Not on file  Intimate Partner Violence: Not on file    Family History  Problem Relation Age of Onset   Hypertension Mother    Hypothyroidism Mother    Heart attack Mother    Diabetes Mother    Heart disease Mother        heart failure   Dementia Mother    Kidney disease Mother    Sleep apnea Mother    Hypertension Father    Hypothyroidism Father    Diabetes Father    Obesity Father    Sleep apnea Father    Cancer Maternal Grandmother        colon   Dementia Maternal Grandmother    Hypertension Maternal Grandmother    Colon cancer Maternal Grandmother    Early death Maternal Grandfather 63       ?pulmonary disease   Pulmonary fibrosis Maternal Grandfather    Seizures Daughter        CACNA1A mutation (seizures, hemiplegic migraines, autism, etc.)   Neurologic Disorder Daughter    Developmental delay Daughter     Past Medical History:  Diagnosis Date   Anxiety     Asthma    Bone spur    cervical L side; L side arm numbness intermittent    Depression    Diabetes in pregnancy    Diabetes type 2, controlled (HCC)    Elevated cholesterol with high triglycerides    Gestational diabetes    Hx of varicella    IBS (irritable bowel syndrome)    Kidney stones  followed by Dr. Patrcia   Migraines    without aura   Ovarian cyst    Postpartum care following vaginal delivery (1/30) 07/15/2013   Preterm uterine contractions 06/10/2013   Sleep apnea    Vitamin D  deficiency     Patient Active Problem List   Diagnosis Date Noted   BMI 37.0-37.9, adult 10/15/2022   Low serum cortisol level 10/01/2022   Polyphagia 10/01/2022   Other fatigue 09/17/2022   SOB (shortness of breath) on exertion 09/17/2022   Vitamin D  deficiency 09/17/2022   Health care maintenance 09/17/2022   BMI 38.0-38.9,adult 09/17/2022   Striae 09/17/2022   Vitamin B 12 deficiency 09/17/2022   Hypertriglyceridemia 09/17/2022   Generalized obesity 07/01/2021   History of menorrhagia 07/01/2021   Fecal smearing 07/01/2021   Anxiety 07/01/2021   Diabetes type 2, controlled (HCC) 06/28/2021   Carpal tunnel syndrome of left wrist 12/26/2018   Chronic migraine without aura without status migrainosus, not intractable 10/09/2018    Past Surgical History:  Procedure Laterality Date   MOUTH SURGERY     wisdom teeth extractions    Current Outpatient Medications  Medication Sig Dispense Refill   albuterol  (PROVENTIL  HFA;VENTOLIN  HFA) 108 (90 BASE) MCG/ACT inhaler Inhale 2 puffs into the lungs every 6 (six) hours as needed for wheezing or shortness of breath.     ALPRAZolam  (XANAX ) 0.5 MG tablet Take 1 tablet (0.5 mg total) by mouth 2 (two) times daily as needed for anxiety 30 tablet 3   atorvastatin  (LIPITOR) 20 MG tablet Take 1 tablet (20 mg total) by mouth daily. 90 tablet 3   baclofen  (LIORESAL ) 10 MG tablet Take 1 tablet (10 mg total) by mouth daily as needed for muscle spasms  (acute migraine onset). 30 tablet 11   buPROPion  (WELLBUTRIN  SR) 150 MG 12 hr tablet Take 1 tablet (150 mg total) by mouth daily. 90 tablet 0   citalopram  (CELEXA ) 20 MG tablet Take 1 tablet (20 mg total) by mouth daily. 90 tablet 12   Erenumab -aooe (AIMOVIG ) 140 MG/ML SOAJ Inject 140 mg into the skin every 30 (thirty) days. 1 mL 11   fluticasone  furoate-vilanterol (BREO ELLIPTA ) 200-25 MCG/ACT AEPB Inhale 1 puff into the lungs daily. 60 each 3   gabapentin  (NEURONTIN ) 300 MG capsule Take 1 capsule (300 mg total) by mouth 2 (two) times daily. 180 capsule 3   hydrOXYzine  (ATARAX ) 25 MG tablet Take 1 tablet (25 mg total) by mouth at bedtime as needed. 30 tablet 1   ibuprofen  (ADVIL ) 600 MG tablet Take 600 mg by mouth every 6 (six) hours as needed.     MELATONIN PO Take 3 mg by mouth as needed.     norethindrone  (MICRONOR ) 0.35 MG tablet Take 1 tablet (0.35 mg total) by mouth daily. 90 tablet 11   ondansetron  (ZOFRAN -ODT) 4 MG disintegrating tablet Dissolve 1 tablet by mouth every 6 hours as needed for nausea or for headache. 30 tablet 0   rizatriptan  (MAXALT -MLT) 10 MG disintegrating tablet DISSOLVE 1 TABLET BY MOUTH AS NEEDED FOR MIGRAINE, MAY REPEAT IN 2 HOURS IF NEEDED 9 tablet 11   Semaglutide , 1 MG/DOSE, 4 MG/3ML SOPN Inject 1 mg under the skin as directed once a week. 3 mL 0   Current Facility-Administered Medications  Medication Dose Route Frequency Provider Last Rate Last Admin   triamcinolone  acetonide (KENALOG -40) injection 10 mg  10 mg Intramuscular Once Marilynne Rosaline SAILOR, MD        Allergies as of 12/22/2023 - Review Complete  12/22/2023  Allergen Reaction Noted   Bactrim  [sulfamethoxazole -trimethoprim ] Hives 11/06/2015   Hydrocodone  11/05/2009   Sulfa  antibiotics Hives 02/26/2017   Vicodin [hydrocodone-acetaminophen ] Nausea And Vomiting 04/07/2013    Vitals: Today's Vitals   12/22/23 1323  BP: 113/67  Pulse: 84  Weight: 209 lb (94.8 kg)  Height: 5' 2 (1.575 m)    Body mass index is 38.23 kg/m.  Physical exam: General: well developed, well nourished, very pleasant middle-age Caucasian female, seated, with obvious headache, darker room, eyes squinting   Neurologic Exam Mental Status: Awake and fully alert. Oriented to place and time. Recent and remote memory intact. Attention span, concentration and fund of knowledge appropriate. Mood and affect appropriate.  Cranial Nerves: Pupils equal, briskly reactive to light. Extraocular movements full without nystagmus. Visual fields full to confrontation. Hearing intact. Facial sensation intact. Face, tongue, palate moves normally and symmetrically.  Motor: Normal bulk and tone. Normal strength in all tested extremity muscles. Gait and Station: Arises from chair without difficulty. Stance is normal. Gait demonstrates normal stride length and balance Reflexes: 1+ and symmetric. Toes downgoing.       Assessment/Plan:  38 year old with chronic migraines with severe migraine 05/2019 accompanied by slurred speech, right arm heaviness and unsteadiness with symptoms shortly resolving.  Migraines greatly controlled on Aimovig , typcially occur around menstrual cycle. Has had persistent migraine over the past 3 days likely triggered by heat and barometric pressure.   Acute migraine headache Provide Toradol  60mg  IM injection today Start Medrol  Dosepak - she is aware to monitor glucose levels Discussed potential side effects of these medications, she verbalized understanding and agrees to proceed Advised to call if headache persists   2.  Chronic migraine without aura Preventative: Continue Aimovig  140 mg SQ monthly and gabapentin  300 mg daily  Emergent: Continue migraine cocktail of rizatriptan , baclofen , gabapentin  300mg  capsule, and ibuprofen . Use of Zofran  as needed   Tried/failed: Topiramate, Imitrex, Nurtec and Ubrelvy       Follow-up in October as scheduled or earlier if needed    CC:   Tisovec, Charlie ORN, MD     I personally spent a total of 25 minutes in the care of the patient today including preparing to see the patient, performing a medically appropriate exam/evaluation, counseling and educating, placing orders, and documenting clinical information in the EHR.   Harlene Bogaert, AGNP-BC  [facility] [address] [place], [place] [address]  Phone [phone] Fax [phone] Note: This document was prepared with digital dictation and possible smart phrase technology. Any transcriptional errors that result from this process are unintentional.  agree with assessment and plan as stated.     Onetha Epp, MD [facility]

## 2023-12-23 ENCOUNTER — Other Ambulatory Visit: Payer: Self-pay

## 2023-12-23 ENCOUNTER — Other Ambulatory Visit (HOSPITAL_COMMUNITY): Payer: Self-pay

## 2023-12-24 ENCOUNTER — Other Ambulatory Visit (HOSPITAL_COMMUNITY): Payer: Self-pay

## 2023-12-24 ENCOUNTER — Other Ambulatory Visit: Payer: Self-pay

## 2023-12-24 MED ORDER — TRETINOIN 0.025 % EX CREA
1.0000 | TOPICAL_CREAM | Freq: Every evening | CUTANEOUS | 5 refills | Status: AC
  Filled 2023-12-24: qty 20, 30d supply, fill #0
  Filled 2024-02-03: qty 20, 30d supply, fill #1
  Filled 2024-04-01: qty 20, 30d supply, fill #2
  Filled 2024-05-25: qty 20, 30d supply, fill #3

## 2023-12-25 ENCOUNTER — Other Ambulatory Visit: Payer: Self-pay

## 2024-01-06 ENCOUNTER — Other Ambulatory Visit (HOSPITAL_COMMUNITY): Payer: Self-pay

## 2024-01-14 ENCOUNTER — Encounter (INDEPENDENT_AMBULATORY_CARE_PROVIDER_SITE_OTHER): Payer: Self-pay

## 2024-01-21 ENCOUNTER — Ambulatory Visit: Admitting: Family Medicine

## 2024-01-22 DIAGNOSIS — G4733 Obstructive sleep apnea (adult) (pediatric): Secondary | ICD-10-CM | POA: Diagnosis not present

## 2024-01-26 ENCOUNTER — Other Ambulatory Visit (HOSPITAL_COMMUNITY): Payer: Self-pay

## 2024-01-26 ENCOUNTER — Ambulatory Visit: Admitting: Family Medicine

## 2024-01-26 ENCOUNTER — Encounter: Payer: Self-pay | Admitting: Family Medicine

## 2024-01-26 VITALS — BP 110/75 | HR 87 | Temp 98.1°F | Resp 20 | Ht 62.0 in | Wt 205.0 lb

## 2024-01-26 VITALS — BP 110/75 | HR 87 | Temp 98.1°F | Ht 62.0 in | Wt 205.0 lb

## 2024-01-26 DIAGNOSIS — Z9189 Other specified personal risk factors, not elsewhere classified: Secondary | ICD-10-CM

## 2024-01-26 DIAGNOSIS — E66812 Obesity, class 2: Secondary | ICD-10-CM

## 2024-01-26 DIAGNOSIS — R110 Nausea: Secondary | ICD-10-CM

## 2024-01-26 DIAGNOSIS — Z6837 Body mass index (BMI) 37.0-37.9, adult: Secondary | ICD-10-CM

## 2024-01-26 DIAGNOSIS — E119 Type 2 diabetes mellitus without complications: Secondary | ICD-10-CM | POA: Diagnosis not present

## 2024-01-26 DIAGNOSIS — Z7985 Long-term (current) use of injectable non-insulin antidiabetic drugs: Secondary | ICD-10-CM

## 2024-01-26 DIAGNOSIS — E559 Vitamin D deficiency, unspecified: Secondary | ICD-10-CM | POA: Diagnosis not present

## 2024-01-26 DIAGNOSIS — Z7289 Other problems related to lifestyle: Secondary | ICD-10-CM | POA: Diagnosis not present

## 2024-01-26 DIAGNOSIS — R638 Other symptoms and signs concerning food and fluid intake: Secondary | ICD-10-CM

## 2024-01-26 MED ORDER — SEMAGLUTIDE (2 MG/DOSE) 8 MG/3ML ~~LOC~~ SOPN
2.0000 mg | PEN_INJECTOR | SUBCUTANEOUS | 0 refills | Status: DC
  Filled 2024-01-26: qty 3, 28d supply, fill #0

## 2024-01-26 MED ORDER — ONDANSETRON 4 MG PO TBDP
4.0000 mg | ORAL_TABLET | Freq: Four times a day (QID) | ORAL | 0 refills | Status: AC | PRN
  Filled 2024-01-26: qty 30, 8d supply, fill #0

## 2024-01-26 MED ORDER — BUPROPION HCL ER (SR) 150 MG PO TB12
150.0000 mg | ORAL_TABLET | Freq: Every day | ORAL | 0 refills | Status: DC
  Filled 2024-01-26: qty 90, 90d supply, fill #0

## 2024-01-26 NOTE — Progress Notes (Signed)
 Office: [phone]  /  Fax: [phone]  WEIGHT SUMMARY AND BIOMETRICS  Starting Date: 09/17/22  Starting Weight: 208lb   Weight Lost Since Last Visit: 0lb   Vitals Temp: 98.1 F (36.7 C) BP: 110/75 Pulse Rate: 87 SpO2: 97 %   Body Composition  Body Fat %: 43.4 % Fat Mass (lbs): 89.2 lbs Muscle Mass (lbs): 110.4 lbs Total Body Water (lbs): 79 lbs Visceral Fat Rating : 10    HPI  Chief Complaint: OBESITY  Abigail Frank is here to discuss her progress with her obesity treatment plan. She is on the keeping a food journal and adhering to recommended goals of 1600-1700 calories and 100 protein and states she is following her eating plan approximately 90 % of the time. She states she is exercising 0 minutes 0 times per week.  Interval History:  Since last office visit she is up 3 lb She is up 2.2 lb of muscle mass and up 0.8 lb of body fat since last visit This gives her a net weight loss of 3 lb in 16 mos of medically supervised weight management This is a 1.4% TBW loss She did go on a cruise and went to the beach between visits She did limit ETOH to once a day on cruise She is weight training and using a pilates board at home 3 x a week She is working a more sedentary job, using an under [employer] and has a walking pad at home (inconsistent) She is feeling less effectiveness with Ozempic  1 mg weekly, tolerating it well  Pharmacotherapy: Ozempic  1 mg weekly  PHYSICAL EXAM:  Blood pressure 110/75, pulse 87, temperature 98.1 F (36.7 C), height 5' 2 (1.575 m), weight 205 lb (93 kg), SpO2 97%. Body mass index is 37.49 kg/m.  General: She is overweight, cooperative, alert, well developed, and in no acute distress. PSYCH: Has normal mood, affect and thought process.   Lungs: Normal breathing effort, no conversational dyspnea.  ASSESSMENT AND PLAN  TREATMENT PLAN FOR OBESITY:  Recommended Dietary Goals  Abigail Frank is currently in the action stage of  change. As such, her goal is to continue weight management plan. She has agreed to keeping a food journal and adhering to recommended goals of 1600 calories and 90-110 g of  protein and practicing portion control and making smarter food choices, such as increasing vegetables and decreasing simple carbohydrates.  Behavioral Intervention  We discussed the following Behavioral Modification Strategies today: increasing lean protein intake to established goals, increasing fiber rich foods, increasing water intake , work on meal planning and preparation, work on Counselling psychologist calories using tracking application, keeping healthy foods at home, work on managing stress, creating time for self-care and relaxation, avoiding temptations and identifying enticing environmental cues, and continue to work on maintaining a reduced calorie state, getting the recommended amount of protein, incorporating whole foods, making healthy choices, staying well hydrated and practicing mindfulness when eating..  Additional resources provided today: NA  Recommended Physical Activity Goals  Abigail Frank has been advised to work up to 150 minutes of moderate intensity aerobic activity a week and strengthening exercises 2-3 times per week for cardiovascular health, weight loss maintenance and preservation of muscle mass.   She has agreed to Increase the intensity, frequency or duration of strengthening exercises  and Increase the intensity, frequency or duration of aerobic exercises   Great job using pilates board at home 3 x a week Add in more consistent walking with a goal of 30  min daily  Pharmacotherapy changes for the treatment of obesity: increase Ozempic  to 2 mg weekly  ASSOCIATED CONDITIONS ADDRESSED TODAY  Controlled type 2 diabetes mellitus without complication, without long-term current use of insulin  (HCC) Lab Results  Component Value Date   HGBA1C 6.2 (H) 10/31/2019  Doing well on Ozempic  1 mg weekly  injection Has occasional nausea Is not routinely checking blood sugars Has been inconsistent with dietary intake and exercise Denies hypoglycemia and does well eating on a schedule Continue to limit added sugar and excess starches Increase walking time Increase Ozempic  to 2 mg weekly -     Semaglutide  (2 MG/DOSE); Inject 2 mg as directed once a week.  Dispense: 3 mL; Refill: 0  Abnormal craving -     buPROPion  HCl ER (SR); Take 1 tablet (150 mg total) by mouth daily.  Dispense: 90 tablet; Refill: 0 Improved on Buproprion SR 150 mg daily without adverse SE Keep junk food out of the house  Nausea Occasional nausea from Ozempic  Avoid overeating, high fat/ fried foods/ greasy foods and sweets Keep ginger chews on hand for prn use Refilled: -     Ondansetron ; Dissolve 1 tablet by mouth every 6 hours as needed for nausea or for headache.  Dispense: 30 tablet; Refill: 0  Class 2 severe obesity due to excess calories with serious comorbidity and body mass index (BMI) of 37.0 to 37.9 in adult [facility] Reviewed Abigail Frank's overall progress.  In 16 mos of medically supervised weight management, she is down just 1.4% of her TBW with the assistance of Ozempic .  Will maximize her Ozempic  dose, track calorie intake and increase walking time.  Consider referral to RD and PREP program for further assistance with diet and exercise components.  Consider the role of bariatric surgery if not progressing by end of the year.  Sedentary lifestyle Unchanged Has home exercise equipment Job has been more sedentary High stress levels have been a barrier  Vitamin D  deficiency Last vitamin D  Lab Results  Component Value Date   VD25OH 30.9 12/17/2023   She has added in an OTC vitamin D  5,000 international units  daily supplement.  Reviewed labs from last visit.  Recommend a vitamin D  level >50.  Continue OTC supplement and recheck in 4 mos.     She was informed of the importance of frequent follow up visits  to maximize her success with intensive lifestyle modifications for her multiple health conditions.   ATTESTASTION STATEMENTS:  Reviewed by clinician on day of visit: allergies, medications, problem list, medical history, surgical history, family history, social history, and previous encounter notes pertinent to obesity diagnosis.   I have personally spent 30 minutes total time today in preparation, patient care, nutritional counseling and education,  and documentation for this visit, including the following: review of most recent clinical lab tests, prescribing medications/ refilling medications, reviewing medical assistant documentation, review and interpretation of bioimpedence results.     Darice Haddock, D.O. DABFM, DABOM [facility] [address] [place], [place] [address] [phone]

## 2024-01-26 NOTE — Patient Instructions (Signed)
 Go up on Ozempic  to 2 mg weekly  Track calorie intake with a goal of 1600 cal/ day Aim for a good 100 g of protein daily  Remember fruits and veggies  Aim for 100 oz of water intake daily  Aim for 30-40 min of walking daily

## 2024-02-03 ENCOUNTER — Telehealth: Payer: Self-pay | Admitting: Pharmacist

## 2024-02-03 ENCOUNTER — Other Ambulatory Visit (HOSPITAL_BASED_OUTPATIENT_CLINIC_OR_DEPARTMENT_OTHER): Payer: Self-pay | Admitting: Certified Nurse Midwife

## 2024-02-03 ENCOUNTER — Other Ambulatory Visit (HOSPITAL_COMMUNITY): Payer: Self-pay

## 2024-02-03 ENCOUNTER — Encounter: Payer: Self-pay | Admitting: Adult Health

## 2024-02-03 ENCOUNTER — Other Ambulatory Visit: Payer: Self-pay

## 2024-02-03 DIAGNOSIS — F419 Anxiety disorder, unspecified: Secondary | ICD-10-CM

## 2024-02-03 MED ORDER — HYDROXYZINE HCL 25 MG PO TABS
25.0000 mg | ORAL_TABLET | Freq: Every evening | ORAL | 5 refills | Status: AC | PRN
  Filled 2024-02-03: qty 30, 30d supply, fill #0
  Filled 2024-04-11: qty 30, 30d supply, fill #1
  Filled 2024-07-12: qty 90, 90d supply, fill #2

## 2024-02-03 NOTE — Telephone Encounter (Signed)
 LOV 10/08/23 Next OV 10/13/24 Last refill 10/28/23, #30, 1 refills  Please review, thanks!

## 2024-02-03 NOTE — Telephone Encounter (Signed)
 Pharmacy Patient Advocate Encounter   Received notification from Patient Pharmacy that prior authorization for Aimovig  140MG /ML auto-injectors is required/requested.   Insurance verification completed.   The patient is insured through [facility] .   Per test claim: PA required; PA submitted to above mentioned insurance via Latent Key/confirmation #/EOC [id number] Status is pending

## 2024-02-03 NOTE — Telephone Encounter (Signed)
 Pharmacy Patient Advocate Encounter  Received notification from [facility] that Prior Authorization for AIMOVIG  140 MG/ML [place] [facility] has been APPROVED from 02/03/2024 to 02/01/2025   PA #/Case ID/Reference #: [id number]

## 2024-02-04 ENCOUNTER — Ambulatory Visit: Admitting: Family Medicine

## 2024-02-17 ENCOUNTER — Other Ambulatory Visit: Payer: Self-pay | Admitting: Internal Medicine

## 2024-02-17 DIAGNOSIS — E119 Type 2 diabetes mellitus without complications: Secondary | ICD-10-CM | POA: Diagnosis not present

## 2024-02-17 DIAGNOSIS — E781 Pure hyperglyceridemia: Secondary | ICD-10-CM | POA: Diagnosis not present

## 2024-02-17 DIAGNOSIS — Z79899 Other long term (current) drug therapy: Secondary | ICD-10-CM | POA: Diagnosis not present

## 2024-02-17 LAB — HEPATIC FUNCTION PANEL
ALT: 18 U/L (ref 7–35)
AST: 13 (ref 13–35)
Alkaline Phosphatase: 0.3 — AB (ref 25–125)

## 2024-02-17 LAB — BASIC METABOLIC PANEL WITH GFR
BUN: 11 (ref 4–21)
CO2: 25 — AB (ref 13–22)
Chloride: 107 (ref 99–108)
Creatinine: 0.6 (ref 0.5–1.1)
Glucose: 103
Potassium: 4.1 meq/L (ref 3.5–5.1)
Sodium: 140 (ref 137–147)

## 2024-02-17 LAB — COMPREHENSIVE METABOLIC PANEL WITH GFR
Albumin: 4.2 (ref 3.5–5.0)
Calcium: 8.7 (ref 8.7–10.7)
EGFR: 112.5

## 2024-02-17 LAB — LIPID PANEL
Cholesterol: 121 (ref 0–200)
HDL: 35 (ref 35–70)
LDL Cholesterol: 54
LDl/HDL Ratio: 1.5
Triglycerides: 162 — AB (ref 40–160)

## 2024-02-17 LAB — CBC AND DIFFERENTIAL
HCT: 41 (ref 36–46)
Hemoglobin: 13.6 (ref 12.0–16.0)
Platelets: 269 K/uL (ref 150–400)
WBC: 7.7

## 2024-02-17 LAB — CBC: RBC: 4.9 (ref 3.87–5.11)

## 2024-02-17 LAB — HEMOGLOBIN A1C: Hemoglobin A1C: 5.9

## 2024-02-24 DIAGNOSIS — Z0000 Encounter for general adult medical examination without abnormal findings: Secondary | ICD-10-CM | POA: Diagnosis not present

## 2024-02-24 DIAGNOSIS — E119 Type 2 diabetes mellitus without complications: Secondary | ICD-10-CM | POA: Diagnosis not present

## 2024-02-24 DIAGNOSIS — F419 Anxiety disorder, unspecified: Secondary | ICD-10-CM | POA: Diagnosis not present

## 2024-02-24 DIAGNOSIS — F331 Major depressive disorder, recurrent, moderate: Secondary | ICD-10-CM | POA: Diagnosis not present

## 2024-02-24 DIAGNOSIS — Z1331 Encounter for screening for depression: Secondary | ICD-10-CM | POA: Diagnosis not present

## 2024-02-24 DIAGNOSIS — E781 Pure hyperglyceridemia: Secondary | ICD-10-CM | POA: Diagnosis not present

## 2024-02-24 DIAGNOSIS — R82998 Other abnormal findings in urine: Secondary | ICD-10-CM | POA: Diagnosis not present

## 2024-02-24 DIAGNOSIS — J4520 Mild intermittent asthma, uncomplicated: Secondary | ICD-10-CM | POA: Diagnosis not present

## 2024-02-24 DIAGNOSIS — G43909 Migraine, unspecified, not intractable, without status migrainosus: Secondary | ICD-10-CM | POA: Diagnosis not present

## 2024-02-24 DIAGNOSIS — F5102 Adjustment insomnia: Secondary | ICD-10-CM | POA: Diagnosis not present

## 2024-02-24 DIAGNOSIS — Z1339 Encounter for screening examination for other mental health and behavioral disorders: Secondary | ICD-10-CM | POA: Diagnosis not present

## 2024-02-24 DIAGNOSIS — M4302 Spondylolysis, cervical region: Secondary | ICD-10-CM | POA: Diagnosis not present

## 2024-02-24 DIAGNOSIS — E669 Obesity, unspecified: Secondary | ICD-10-CM | POA: Diagnosis not present

## 2024-02-25 ENCOUNTER — Encounter: Payer: Self-pay | Admitting: Nurse Practitioner

## 2024-02-25 ENCOUNTER — Other Ambulatory Visit: Payer: Self-pay

## 2024-02-25 ENCOUNTER — Ambulatory Visit: Admitting: Nurse Practitioner

## 2024-02-25 ENCOUNTER — Other Ambulatory Visit (HOSPITAL_COMMUNITY): Payer: Self-pay

## 2024-02-25 VITALS — BP 109/76 | HR 94 | Temp 98.1°F | Ht 62.0 in | Wt 199.0 lb

## 2024-02-25 DIAGNOSIS — E66812 Obesity, class 2: Secondary | ICD-10-CM | POA: Diagnosis not present

## 2024-02-25 DIAGNOSIS — R638 Other symptoms and signs concerning food and fluid intake: Secondary | ICD-10-CM

## 2024-02-25 DIAGNOSIS — Z7985 Long-term (current) use of injectable non-insulin antidiabetic drugs: Secondary | ICD-10-CM

## 2024-02-25 DIAGNOSIS — Z6836 Body mass index (BMI) 36.0-36.9, adult: Secondary | ICD-10-CM | POA: Diagnosis not present

## 2024-02-25 DIAGNOSIS — E119 Type 2 diabetes mellitus without complications: Secondary | ICD-10-CM | POA: Diagnosis not present

## 2024-02-25 MED ORDER — BUPROPION HCL ER (SR) 150 MG PO TB12
150.0000 mg | ORAL_TABLET | Freq: Every day | ORAL | 0 refills | Status: DC
  Filled 2024-02-25 – 2024-05-25 (×2): qty 90, 90d supply, fill #0

## 2024-02-25 MED ORDER — SEMAGLUTIDE (2 MG/DOSE) 8 MG/3ML ~~LOC~~ SOPN
2.0000 mg | PEN_INJECTOR | SUBCUTANEOUS | 0 refills | Status: DC
  Filled 2024-02-25: qty 3, 28d supply, fill #0

## 2024-02-25 NOTE — Progress Notes (Signed)
 Office: [phone]  /  Fax: [phone]  WEIGHT SUMMARY AND BIOMETRICS  Weight Lost Since Last Visit: 6lb  Weight Gained Since Last Visit: 0   Vitals Temp: 98.1 F (36.7 C) BP: 109/76 Pulse Rate: 94 SpO2: 96 %   Anthropometric Measurements Height: 5' 2 (1.575 m) Weight: 199 lb (90.3 kg) BMI (Calculated): 36.39 Weight at Last Visit: 205lb Weight Lost Since Last Visit: 6lb Weight Gained Since Last Visit: 0 Starting Weight: 208lb Total Weight Loss (lbs): 9 lb (4.082 kg)   Body Composition  Body Fat %: 42.6 % Fat Mass (lbs): 85.2 lbs Muscle Mass (lbs): 108.8 lbs Total Body Water (lbs): 77 lbs Visceral Fat Rating : 10   Other Clinical Data Fasting: no Labs: no Today's Visit #: 17 Starting Date: 09/17/22     HPI  Chief Complaint: OBESITY  Abigail Frank is here to discuss her progress with her obesity treatment plan. She is on the keeping a food journal and adhering to recommended goals of 1600-1700 calories and 100 protein and states she is following her eating plan approximately 0 % of the time. She states she is exercising 20-30 minutes 7 days per week.   Interval History:  Since last office visit she has lost 6 pounds.  She reports that she hasn't been < 200 lbs in over 10 years.  She is averaging around 1000-1200 calories. She is not using an app to track.  She is drinking a protein shake and water daily.    She is struggling with meeting her protein and calorie goals due to bloating   BF:  [place] oatmeal or cottage cheese with jam or english muffin with reduced fat cream cheese with water Snack:  pretzels Lunch:  1/2 sandwich with [place] and cheese or frozen meal 1/2 (15 grams protein for 1/2 meal) with water Snack:  fruit Dinner:  protein and a few bites of green beans or vegetables with water Snack:  protein shake  Pharmacotherapy for weight loss: She is currently taking Wellbutrin  SR 150mg  off label for cravings.  Denies side effects. Has helped  with nighttime cravings and has helped to increase her energy levels.  Her mood is overall better     Previous pharmacotherapy for medical weight loss:  None   Bariatric surgery:  Patient has not had bariatric surgery  Pharmacotherapy for DMT2:  She is currently taking Ozempic  1.5.  Reports side effects of daily nausea, bloating and reflux.  She tried to take Ozempic  2mg  and decrease due to side effects of abd pain, nausea, bloating.   Last A1c was 5.9 on 02/17/24 She is not checking BS at home.   On ACE or ARB, ASA 81mg  and statin.  Last eye exam:  Oct 2024-scheduled in October She has tried Ozempic  and Glyburide  in the past.    Lab Results  Component Value Date   HGBA1C 6.2 (H) 10/31/2019   Lab Results  Component Value Date   LDLCALC 90 12/17/2023   CREATININE 0.74 12/17/2023         PHYSICAL EXAM:  Blood pressure 109/76, pulse 94, temperature 98.1 F (36.7 C), height 5' 2 (1.575 m), weight 199 lb (90.3 kg), SpO2 96%. Body mass index is 36.4 kg/m.  General: She is overweight, cooperative, alert, well developed, and in no acute distress. PSYCH: Has normal mood, affect and thought process.   Extremities: No edema.  Neurologic: No gross sensory or motor deficits. No tremors or fasciculations noted.    DIAGNOSTIC DATA REVIEWED:  BMET    Component Value Date/Time   NA 141 12/17/2023 0949   K 4.7 12/17/2023 0949   CL 103 12/17/2023 0949   CO2 21 12/17/2023 0949   GLUCOSE 104 (H) 12/17/2023 0949   GLUCOSE 151 (H) 06/16/2017 2322   BUN 12 12/17/2023 0949   CREATININE 0.74 12/17/2023 0949   CALCIUM  9.4 12/17/2023 0949   GFRNONAA 115 10/31/2019 1518   GFRAA 132 10/31/2019 1518   Lab Results  Component Value Date   HGBA1C 6.2 (H) 10/31/2019   Lab Results  Component Value Date   INSULIN  21.5 09/17/2022   Lab Results  Component Value Date   TSH 1.660 12/17/2023   CBC    Component Value Date/Time   WBC 7.0 12/17/2023 0949   WBC 8.5 07/14/2017 1209   RBC  5.06 12/17/2023 0949   RBC 5.12 (H) 07/14/2017 1209   HGB 13.7 12/17/2023 0949   HCT 45.3 12/17/2023 0949   PLT 289 12/17/2023 0949   MCV 90 12/17/2023 0949   MCH 27.1 12/17/2023 0949   MCH 27.9 06/16/2017 2322   MCHC 30.2 (L) 12/17/2023 0949   MCHC 33.6 07/14/2017 1209   RDW 13.4 12/17/2023 0949   Iron Studies    Component Value Date/Time   IRON 40 10/31/2019 1518   TIBC 333 10/31/2019 1518   FERRITIN 60 10/31/2019 1518   IRONPCTSAT 12 (L) 10/31/2019 1518   Lipid Panel     Component Value Date/Time   CHOL 160 12/17/2023 0949   TRIG 181 (H) 12/17/2023 0949   HDL 39 (L) 12/17/2023 0949   LDLCALC 90 12/17/2023 0949   Hepatic Function Panel     Component Value Date/Time   PROT 6.7 12/17/2023 0949   ALBUMIN 4.3 12/17/2023 0949   AST 17 12/17/2023 0949   ALT 22 12/17/2023 0949   ALKPHOS 92 12/17/2023 0949   BILITOT <0.2 12/17/2023 0949      Component Value Date/Time   TSH 1.660 12/17/2023 0949   Nutritional Lab Results  Component Value Date   VD25OH 30.9 12/17/2023     ASSESSMENT AND PLAN  TREATMENT PLAN FOR OBESITY:  Recommended Dietary Goals  Abigail Frank is currently in the action stage of change. As such, her goal is to continue weight management plan. She has agreed to keeping a food journal and adhering to recommended goals of 1500 calories and 80+ grams of protein.  Behavioral Intervention  We discussed the following Behavioral Modification Strategies today: increasing lean protein intake to established goals, decreasing simple carbohydrates , increasing vegetables, increasing fiber rich foods, avoiding skipping meals, increasing water intake , work on meal planning and preparation, work on tracking and journaling calories using tracking application, reading food labels , keeping healthy foods at home, continue to practice mindfulness when eating, planning for success, continue to work on maintaining a reduced calorie state, getting the recommended amount of  protein, incorporating whole foods, making healthy choices, staying well hydrated and practicing mindfulness when eating., and increase protein intake, fibrous foods (25 grams per day for women, 30 grams for men) and water to improve satiety and decrease hunger signals. .  Additional resources provided today: NA  Recommended Physical Activity Goals  Scharlene has been advised to work up to 150 minutes of moderate intensity aerobic activity a week and strengthening exercises 2-3 times per week for cardiovascular health, weight loss maintenance and preservation of muscle mass.   She has agreed to Think about enjoyable ways to increase daily physical activity and overcoming barriers  to exercise, Increase physical activity in their day and reduce sedentary time (increase NEAT)., Start strengthening exercises with a goal of 2-3 sessions a week , and Combine aerobic and strengthening exercises for efficiency and improved cardiometabolic health.   ASSOCIATED CONDITIONS ADDRESSED TODAY  Action/Plan  Controlled type 2 diabetes mellitus without complication, without long-term current use of insulin  (HCC) -     Semaglutide  (2 MG/DOSE); Inject 2 mg as directed once a week.  Dispense: 3 mL; Refill: 0  Decrease Ozempic  to 1.5mg  due to side effects.  To let me know how she is doing next week and if her symptoms worsen or persist    Keep appt with opthalmology   Abnormal craving -     buPROPion  HCl ER (SR); Take 1 tablet (150 mg total) by mouth daily.  Dispense: 90 tablet; Refill: 0.  Side effects discussed.    Discussed stopping or decreasing Wellbutrin .  Patient does not want to stop Wellbutrin  at this time.  She says it significantly helps her with her mood and she overall feels better while taking it.  Obesity, Class II, BMI 35-39.9     Recent labs reviewed and scanned into chart    Return in about 6 weeks (around 04/07/2024).SABRA She was informed of the importance of frequent follow up visits to  maximize her success with intensive lifestyle modifications for her multiple health conditions.   ATTESTASTION STATEMENTS:  Reviewed by clinician on day of visit: allergies, medications, problem list, medical history, surgical history, family history, social history, and previous encounter notes.    Corean SAUNDERS. Raven Furnas FNP-C

## 2024-03-04 ENCOUNTER — Other Ambulatory Visit: Payer: Self-pay

## 2024-03-04 ENCOUNTER — Other Ambulatory Visit (HOSPITAL_COMMUNITY): Payer: Self-pay

## 2024-03-05 ENCOUNTER — Encounter: Payer: Self-pay | Admitting: Nurse Practitioner

## 2024-03-07 ENCOUNTER — Other Ambulatory Visit (HOSPITAL_COMMUNITY): Payer: Self-pay

## 2024-03-09 ENCOUNTER — Other Ambulatory Visit (HOSPITAL_COMMUNITY): Payer: Self-pay

## 2024-03-25 ENCOUNTER — Other Ambulatory Visit: Payer: Self-pay

## 2024-03-25 ENCOUNTER — Other Ambulatory Visit (HOSPITAL_COMMUNITY): Payer: Self-pay

## 2024-03-25 MED ORDER — CEPHALEXIN 250 MG PO CAPS
250.0000 mg | ORAL_CAPSULE | Freq: Three times a day (TID) | ORAL | 0 refills | Status: DC
  Filled 2024-03-25 (×2): qty 15, 5d supply, fill #0

## 2024-03-28 ENCOUNTER — Other Ambulatory Visit: Payer: Self-pay

## 2024-04-01 ENCOUNTER — Other Ambulatory Visit: Payer: Self-pay

## 2024-04-01 ENCOUNTER — Other Ambulatory Visit (HOSPITAL_COMMUNITY): Payer: Self-pay

## 2024-04-04 ENCOUNTER — Encounter: Payer: Self-pay | Admitting: Nurse Practitioner

## 2024-04-06 ENCOUNTER — Other Ambulatory Visit (HOSPITAL_COMMUNITY): Payer: Self-pay

## 2024-04-06 ENCOUNTER — Other Ambulatory Visit: Payer: Self-pay | Admitting: Nurse Practitioner

## 2024-04-06 DIAGNOSIS — E119 Type 2 diabetes mellitus without complications: Secondary | ICD-10-CM

## 2024-04-06 MED ORDER — SEMAGLUTIDE (2 MG/DOSE) 8 MG/3ML ~~LOC~~ SOPN
2.0000 mg | PEN_INJECTOR | SUBCUTANEOUS | 0 refills | Status: DC
  Filled 2024-04-06: qty 3, 28d supply, fill #0

## 2024-04-07 ENCOUNTER — Ambulatory Visit: Admitting: Nurse Practitioner

## 2024-04-07 ENCOUNTER — Other Ambulatory Visit: Payer: Self-pay

## 2024-04-07 ENCOUNTER — Encounter: Payer: Self-pay | Admitting: Nurse Practitioner

## 2024-04-07 ENCOUNTER — Other Ambulatory Visit (HOSPITAL_COMMUNITY): Payer: Self-pay

## 2024-04-07 VITALS — BP 116/75 | HR 75 | Temp 98.2°F | Ht 62.0 in | Wt 203.0 lb

## 2024-04-07 DIAGNOSIS — Z7985 Long-term (current) use of injectable non-insulin antidiabetic drugs: Secondary | ICD-10-CM

## 2024-04-07 DIAGNOSIS — E66812 Obesity, class 2: Secondary | ICD-10-CM | POA: Diagnosis not present

## 2024-04-07 DIAGNOSIS — E119 Type 2 diabetes mellitus without complications: Secondary | ICD-10-CM | POA: Diagnosis not present

## 2024-04-07 DIAGNOSIS — Z6837 Body mass index (BMI) 37.0-37.9, adult: Secondary | ICD-10-CM

## 2024-04-07 MED ORDER — TIRZEPATIDE 2.5 MG/0.5ML ~~LOC~~ SOAJ
2.5000 mg | SUBCUTANEOUS | 0 refills | Status: DC
  Filled 2024-04-07 (×3): qty 2, 28d supply, fill #0

## 2024-04-07 NOTE — Progress Notes (Signed)
 Office: [phone]  /  Fax: [phone]  WEIGHT SUMMARY AND BIOMETRICS  Weight Lost Since Last Visit: 0lb  Weight Gained Since Last Visit: 4lb   Vitals Temp: 98.2 F (36.8 C) BP: 116/75 Pulse Rate: 75 SpO2: 98 %   Anthropometric Measurements Height: 5' 2 (1.575 m) Weight: 203 lb (92.1 kg) BMI (Calculated): 37.12 Weight at Last Visit: 199lb Weight Lost Since Last Visit: 0lb Weight Gained Since Last Visit: 4lb Starting Weight: 208lb Total Weight Loss (lbs): 5 lb (2.268 kg)   Body Composition  Body Fat %: 43.8 % Fat Mass (lbs): 89.2 lbs Muscle Mass (lbs): 108.8 lbs Total Body Water (lbs): 78 lbs Visceral Fat Rating : 11   Other Clinical Data Fasting: Yes Labs: No Today's Visit #: 18 Starting Date: 09/17/22     HPI  Chief Complaint: OBESITY  Jacolyn is here to discuss her progress with her obesity treatment plan. She is on the keeping a food journal and adhering to recommended goals of 1600-1700 calories and 100 protein and states she is following her eating plan approximately 100 % of the time. She states she is exercising 30 minutes 3 days per week.   Interval History:  Since last office visit she has gained 4 pounds.  She is planning to start tracking using mynetdiary.  She thinks she is averaging around 1700 calories. Unsure of protein, carbs and fats intake.  She is drinking water daily.  She is walking 1 mile 3 days per week.    Pharmacotherapy for weight loss: She is currently taking Wellbutrin  SR 150mg  off label for cravings.  Denies side effects. Has helped with nighttime cravings and has helped to increase her energy levels.  Her mood is overall better     Previous pharmacotherapy for medical weight loss:  None   Bariatric surgery:  Patient has not had bariatric surgery   Pharmacotherapy for DMT2:   She is currently taking Ozempic  1.5.  See patient messages on 04/04/24.   Last A1c was 5.9 on 02/17/24 She is not checking BS at home.   On  Lipitor  Last eye exam:  Oct 2024 She has tried Ozempic  and Glyburide in the past.   Lab Results  Component Value Date   HGBA1C 5.9 02/17/2024   HGBA1C 6.2 (H) 10/31/2019   Lab Results  Component Value Date   LDLCALC 54 02/17/2024   CREATININE 0.6 02/17/2024    PHYSICAL EXAM:  Blood pressure 116/75, pulse 75, temperature 98.2 F (36.8 C), height 5' 2 (1.575 m), weight 203 lb (92.1 kg), last menstrual period 03/08/2024, SpO2 98%. Body mass index is 37.13 kg/m.  General: She is overweight, cooperative, alert, well developed, and in no acute distress. PSYCH: Has normal mood, affect and thought process.   Extremities: No edema.  Neurologic: No gross sensory or motor deficits. No tremors or fasciculations noted.    DIAGNOSTIC DATA REVIEWED:  BMET    Component Value Date/Time   NA 140 02/17/2024 0000   K 4.1 02/17/2024 0000   CL 107 02/17/2024 0000   CO2 25 (A) 02/17/2024 0000   GLUCOSE 104 (H) 12/17/2023 0949   GLUCOSE 151 (H) 06/16/2017 2322   BUN 11 02/17/2024 0000   CREATININE 0.6 02/17/2024 0000   CREATININE 0.74 12/17/2023 0949   CALCIUM  8.7 02/17/2024 0000   GFRNONAA 115 10/31/2019 1518   GFRAA 132 10/31/2019 1518   Lab Results  Component Value Date   HGBA1C 5.9 02/17/2024   HGBA1C 6.2 (H) 10/31/2019  Lab Results  Component Value Date   INSULIN  21.5 09/17/2022   Lab Results  Component Value Date   TSH 1.660 12/17/2023   CBC    Component Value Date/Time   WBC 7.7 02/17/2024 0000   WBC 8.5 07/14/2017 1209   RBC 4.9 02/17/2024 0000   HGB 13.6 02/17/2024 0000   HGB 13.7 12/17/2023 0949   HCT 41 02/17/2024 0000   HCT 45.3 12/17/2023 0949   PLT 269 02/17/2024 0000   PLT 289 12/17/2023 0949   MCV 90 12/17/2023 0949   MCH 27.1 12/17/2023 0949   MCH 27.9 06/16/2017 2322   MCHC 30.2 (L) 12/17/2023 0949   MCHC 33.6 07/14/2017 1209   RDW 13.4 12/17/2023 0949   Iron Studies    Component Value Date/Time   IRON 40 10/31/2019 1518   TIBC 333  10/31/2019 1518   FERRITIN 60 10/31/2019 1518   IRONPCTSAT 12 (L) 10/31/2019 1518   Lipid Panel     Component Value Date/Time   CHOL 121 02/17/2024 0000   CHOL 160 12/17/2023 0949   TRIG 162 (A) 02/17/2024 0000   HDL 35 02/17/2024 0000   HDL 39 (L) 12/17/2023 0949   LDLCALC 54 02/17/2024 0000   LDLCALC 90 12/17/2023 0949   Hepatic Function Panel     Component Value Date/Time   PROT 6.7 12/17/2023 0949   ALBUMIN 4.2 02/17/2024 0000   ALBUMIN 4.3 12/17/2023 0949   AST 13 02/17/2024 0000   ALT 18 02/17/2024 0000   ALKPHOS 0.3 (A) 02/17/2024 0000   BILITOT <0.2 12/17/2023 0949      Component Value Date/Time   TSH 1.660 12/17/2023 0949   Nutritional Lab Results  Component Value Date   VD25OH 30.9 12/17/2023     ASSESSMENT AND PLAN  TREATMENT PLAN FOR OBESITY:  Recommended Dietary Goals  Kilah is currently in the action stage of change. As such, her goal is to continue weight management plan. She has agreed to track and will review calories and macros at her next visit.  Behavioral Intervention  We discussed the following Behavioral Modification Strategies today: increasing lean protein intake to established goals, decreasing simple carbohydrates , increasing vegetables, increasing fiber rich foods, avoiding skipping meals, increasing water intake , work on meal planning and preparation, work on tracking and journaling calories using tracking application, reading food labels , keeping healthy foods at home, planning for success, continue to work on maintaining a reduced calorie state, getting the recommended amount of protein, incorporating whole foods, making healthy choices, staying well hydrated and practicing mindfulness when eating., and increase protein intake, fibrous foods (25 grams per day for women, 30 grams for men) and water to improve satiety and decrease hunger signals. .  Additional resources provided today: protein content of food, smart fruit  choices  Recommended Physical Activity Goals  Jessilyn has been advised to work up to 150 minutes of moderate intensity aerobic activity a week and strengthening exercises 2-3 times per week for cardiovascular health, weight loss maintenance and preservation of muscle mass.   She has agreed to Think about enjoyable ways to increase daily physical activity and overcoming barriers to exercise, Increase physical activity in their day and reduce sedentary time (increase NEAT)., Start strengthening exercises with a goal of 2-3 sessions a week , Increase the intensity, frequency or duration of aerobic exercises  , Work on scheduling and tracking physical activity. , Continue to gradually increase the amount and intensity of exercise routine, Increase volume of physical activity  to a goal of 240 minutes a week, and Combine aerobic and strengthening exercises for efficiency and improved cardiometabolic health.    ASSOCIATED CONDITIONS ADDRESSED TODAY  Action/Plan  Controlled type 2 diabetes mellitus without complication, without long-term current use of insulin  (HCC) -     Amb ref to Medical Nutrition Therapy-MNT -     Tirzepatide; Inject 2.5 mg into the skin once a week.  Dispense: 2 mL; Refill: 0 -     Stop Ozempic   Class 2 severe obesity due to excess calories with serious comorbidity and body mass index (BMI) of 37.0 to 37.9 in adult -     Amb ref to Medical Nutrition Therapy-MNT      Reviewed Chaia Ikard's overall progress. In 18 mos of medically supervised weight management, she is down just 1.02% of her TBW with the assistance of Ozempic . We have tried to maximize her Ozempic  dose but she hasn't been able to tolerate higher doses.  She needs to track calorie intake and increase walking time and add in resistance training. Referral to RD placed. Consider the role of bariatric surgery if not progressing by end of the year. Patient would like to see how she does on Mounjaor first.    Return in  about 4 weeks (around 05/05/2024).SABRA She was informed of the importance of frequent follow up visits to maximize her success with intensive lifestyle modifications for her multiple health conditions.   ATTESTASTION STATEMENTS:  Reviewed by clinician on day of visit: allergies, medications, problem list, medical history, surgical history, family history, social history, and previous encounter notes.     Corean SAUNDERS. Jong Rickman FNP-C

## 2024-04-11 ENCOUNTER — Other Ambulatory Visit (HOSPITAL_COMMUNITY): Payer: Self-pay

## 2024-04-11 ENCOUNTER — Other Ambulatory Visit: Payer: Self-pay

## 2024-04-11 ENCOUNTER — Other Ambulatory Visit (HOSPITAL_BASED_OUTPATIENT_CLINIC_OR_DEPARTMENT_OTHER): Payer: Self-pay

## 2024-04-11 MED ORDER — ALPRAZOLAM 0.5 MG PO TABS
0.5000 mg | ORAL_TABLET | Freq: Two times a day (BID) | ORAL | 3 refills | Status: AC | PRN
  Filled 2024-04-11 – 2024-04-21 (×3): qty 30, 15d supply, fill #0
  Filled 2024-06-06: qty 30, 15d supply, fill #1
  Filled 2024-07-12: qty 30, 15d supply, fill #2

## 2024-04-13 NOTE — Progress Notes (Unsigned)
 [facility]    Provider:  Dr Ines Requesting Provider: Tisovec, Charlie ORN, MD Primary Care Provider:  Tisovec, Richard W, MD    CC:  No chief complaint on file.     HPI:  Update 04/14/2024 JM: Patient returns for follow-up visit.  She was seen approximately 3 months ago due to persistent migraine headache.   Migraines currently well-controlled and remains on Aimovig  monthly injection and gabapentin  for prevention and rizatriptan  as needed.  Use of baclofen  ***      History provided for reference purposes only Update 12/22/2023 JM: Patient returns for acute visit.  Reports having a persistent migraine over the past 3 days, typical presentation, right occiput, behind right eye, pressure sensation.  She has tried her normal cocktail of rizatriptan , baclofen , ibuprofen  and gabapentin  which has helped only minimally.  She questions receiving Toradol  injection.  Last use of ibuprofen  yesterday. Reports previously doing a steroid taper pack, glucose levels can be slightly increased the first day but then stabilize, otherwise tolerates well. Migraines have been well controlled overall on Aimovig  and rescue medication typically works well. Believes current migraine was triggered from heat (was at a parade on July 4th) and changes in barometric pressure.  Update 04/15/2023 JM: Patient returns for follow-up visit previously seen by Amy, NP in 10/2022.  Previously, she was diagnosed with moderate OSA with total AHI 18.6/h and O2 nadir 75% and placed on AutoPAP therapy but noted difficulty tolerating CPAP resulting in suboptimal compliance at 13% >4 hr usage, recommended mask refitting.  Migraines remained well-controlled on current regimen.  Currently, migraines well-controlled. Remains on Aimovig  monthly injection and gabapentin  300mg  nightly for prevention. Reports 2-3 migraines per month, usually with menstrual cycle. Takes a cocktail of rizatriptan , baclofen , ibuprofen ,  gabapentin  300mg  cap with great benefit.   Being followed by [facility] for weight loss assistance, working on Ozempic  coverage through insurance.  CPAP compliance report over the past 30 days shows 17 out of 30 usage days with 11 days greater than 4 hours for 37% compliance.  Residual AHI 2.1.  Pressure in the 95 percentile 9.1 pressure setting of 5-12 with EPR 3.  Leaks in the 9th percentile 0.8.  Trying to work on using more consistently, tolerance gradually improving. She did not get a mask refit, she was able to tighten her mask and leaks improved. Does note benefit with use.  Routinely follows with DME company and up to date on supplies. No questions or concerns today.   Update 10/24/2021 JM: Patient returns for 1 year migraine follow-up.  Overall doing well.  Migraines remain well controlled on Aimovig . She has been experiencing a migraine headache about 1x/week typically upon awakening over the past 6 months.  Previously, would experience about 1 migraine per week but randomly throughout the day.  Pain remains behind right eye and right occipital area.  Will take a rizatriptan  and fall asleep for another hour and typically migraine will be resolved.  If severe, will take Zofran  and baclofen  in addition to rizatriptan  with resolution.  Upon further questioning, she does admit to insomnia waking up frequently throughout the night and difficulty falling asleep, fatigue during the day with naps, and snores.  She has not previously underwent sleep study.  She is currently working on weight loss and has lost about 30 pounds since prior visit.  No further concerns at this time.  Update 11/05/2020 JM: Ms. Bene returns for yearly migraine follow-up  Migraines have been well controlled  with monthly use of Aimovig .  She has only been experiencing migraines during her monthly cycle and with increased neck pain.  Reports her PCP recently referred her to [facility] for a bone  spur at C2-3 and possible nerve ablation per patient-initial evaluation next week.  Use of Maxalt  with some benefit but usually will take up to 2 hours to take effect.  Trialed Nurtec with quick resolution when able to catch early enough. Will also use Zofran  and baclofen  with benefit.  Will occasionally use tizadine and Reglan  but does cause excessive fatigue.   Update 11/03/2019 JM: Ms. Lehenbauer returns for migraine follow-up.  Current migraine treatment plan includes  Prevention: Aimovig  140 mg SQ monthly, Emergent as needed: ubrelvy 50 mg as needed and baclofen ; if severe unable to sleep, rizatriptan  10 mg and tizanidine  4 mg.  Zofran  4 mg as needed for nausea  Reports severe migraine in 05/2019 with typical migraine onset and location but accompanied by slurred speech, right arm heaviness and unsteadiness.  She has not previously experienced the symptoms.  Pain rated 10/10.  She ended up falling asleep after taking rizatriptan  and tizanidine  and after awakening 4 hours later, symptoms resolved including migraine but did continue to feel spaced out sensation which is typical for her.  She has not experienced recurrent neurological symptoms since that time.  During the month of March and April, she was experiencing migraines 3-4 times weekly lasting 4 to 6 hours without benefit of emergent medications.  She does admit to missing Aimovig  injections in January and February due to home stressors.  Restarted monthly Aimovig  in March and migraines have greatly improved since that time.  She does endorse ongoing bilateral neck pain and feels as though this contributes to migraine onset.  She has trialed massage in the past without great benefit.   Interval history 11/24/2018 Dr. Ines:  Follow up for migraines and new symptom hand pain.  Patient reports for several years she has had hand pain numbness and tingling mostly in digits 1 through 3 more so on the left but also on the right.  She wakes up in the  middle of the night and has to shake her hands out.  She had an MRI of the cervical spine in 2017 due to the symptoms but did not show etiology.  It is worsening.  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  Positive Phalen's sign.  I discussed carpal tunnel syndrome with patient and we will have her in for an EMG nerve conduction study.  She is doing amazing on Aimovig  she loves it continue.  Personally reviewed MRI of th cervical spine and agree with the following 2017:   IMPRESSION: Negative for central canal stenosis. Uncovertebral spurring results in some foraminal narrowing on the left appearing most notable at C3-4 with a very mild degree of foraminal narrowing on the left at C4-5 and C5-6.   Initial consult visit 10/07/2018 Dr. Ines:  Corean I Kibby is a 38 y.o. female here as requested by Tisovec, Charlie ORN, MD for migraines. PMHx migraine. No family history that she knows of, her mother is deceased. Struggling for years. Started during her menses as a teenager and have worsened over the years. Worse with life stressors. At least 2-3 migraines. She has 25 headache days a month. At least 12 migraine days a month that can be moderately severe or severe. Pounding/pulsating, interrupts her daily life, she has to lay down, turn off the  light, nausea, no vomiting, +photo/phonophbia, Can last 24-72 hours. No medication overuse. No aura. Ongoing for over a year. Migraines starts as tension in the neck and creep up the back of her neck, pins and needles, to the right and then all over, pain behind the right eye, right side is the worse side. They can be positional. She wakes with them in the morning. Her left arm goes numb, tried a steroid pack. She follows with an orthopaedist for her neck. Positions of her head can cause the headaches. Neck pain and tightness. No other focal neurologic deficits, associated symptoms, inciting events or modifiable factors.  Meds tried: zoloft. Topamax(made her  crazy), ubrelvy, baclofen , imitrex..      Review of Systems: Patient complains of symptoms per HPI as well as the following symptoms: Headaches.  Pertinent negatives and positives per HPI. All others negative.   Social History   Socioeconomic History   Marital status: Married    Spouse name: Not on file   Number of children: 2   Years of education: Not on file   Highest education level: Bachelor's degree (e.g., BA, AB, BS)  Occupational History   Not on file  Tobacco Use   Smoking status: Never   Smokeless tobacco: Never  Vaping Use   Vaping status: Never Used  Substance and Sexual Activity   Alcohol  use: Yes    Comment: on occasion a glass of wine   Drug use: Never   Sexual activity: Yes    Birth control/protection: OCP  Other Topics Concern   Not on file  Social History Narrative   Lives at home with her family   Left handed   Caffeine: regular, at least 2 cups of coffee daily   Social Drivers of Corporate Investment Banker Strain: Not on file  Food Insecurity: Not on file  Transportation Needs: Not on file  Physical Activity: Not on file  Stress: Not on file  Social Connections: Not on file  Intimate Partner Violence: Not on file    Family History  Problem Relation Age of Onset   Hypertension Mother    Hypothyroidism Mother    Heart attack Mother    Diabetes Mother    Heart disease Mother        heart failure   Dementia Mother    Kidney disease Mother    Sleep apnea Mother    Hypertension Father    Hypothyroidism Father    Diabetes Father    Obesity Father    Sleep apnea Father    Cancer Maternal Grandmother        colon   Dementia Maternal Grandmother    Hypertension Maternal Grandmother    Colon cancer Maternal Grandmother    Early death Maternal Grandfather 23       ?pulmonary disease   Pulmonary fibrosis Maternal Grandfather    Seizures Daughter        CACNA1A mutation (seizures, hemiplegic migraines, autism, etc.)   Neurologic  Disorder Daughter    Developmental delay Daughter     Past Medical History:  Diagnosis Date   Anxiety    Asthma    Bone spur    cervical L side; L side arm numbness intermittent    Depression    Diabetes in pregnancy    Diabetes type 2, controlled (HCC)    Elevated cholesterol with high triglycerides    Gestational diabetes    Hx of varicella    IBS (irritable bowel syndrome)  Kidney stones    followed by Dr. Patrcia   Migraines    without aura   Ovarian cyst    Postpartum care following vaginal delivery (1/30) 07/15/2013   Preterm uterine contractions 06/10/2013   Sleep apnea    Vitamin D  deficiency     Patient Active Problem List   Diagnosis Date Noted   BMI 37.0-37.9, adult 10/15/2022   Low serum cortisol level 10/01/2022   Polyphagia 10/01/2022   Other fatigue 09/17/2022   SOB (shortness of breath) on exertion 09/17/2022   Vitamin D  deficiency 09/17/2022   Health care maintenance 09/17/2022   BMI 38.0-38.9,adult 09/17/2022   Striae 09/17/2022   Vitamin B 12 deficiency 09/17/2022   Hypertriglyceridemia 09/17/2022   Generalized obesity 07/01/2021   History of menorrhagia 07/01/2021   Fecal smearing 07/01/2021   Anxiety 07/01/2021   Diabetes type 2, controlled (HCC) 06/28/2021   Carpal tunnel syndrome of left wrist 12/26/2018   Chronic migraine without aura without status migrainosus, not intractable 10/09/2018    Past Surgical History:  Procedure Laterality Date   MOUTH SURGERY     wisdom teeth extractions    Current Outpatient Medications  Medication Sig Dispense Refill   albuterol  (PROVENTIL  HFA;VENTOLIN  HFA) 108 (90 BASE) MCG/ACT inhaler Inhale 2 puffs into the lungs every 6 (six) hours as needed for wheezing or shortness of breath.     ALPRAZolam  (XANAX ) 0.5 MG tablet Take 1 tablet (0.5 mg total) by mouth 2 (two) times daily as needed for anxiety 30 tablet 3   atorvastatin  (LIPITOR) 20 MG tablet Take 1 tablet (20 mg total) by mouth daily. 90 tablet  3   baclofen  (LIORESAL ) 10 MG tablet Take 1 tablet (10 mg total) by mouth daily as needed for muscle spasms (acute migraine onset). 30 tablet 11   buPROPion  (WELLBUTRIN  SR) 150 MG 12 hr tablet Take 1 tablet (150 mg total) by mouth daily. 90 tablet 0   cephALEXin  (KEFLEX ) 250 MG capsule Take 1 capsule (250 mg total) by mouth 3 (three) times daily. 15 capsule 0   citalopram  (CELEXA ) 20 MG tablet Take 1 tablet (20 mg total) by mouth daily. 90 tablet 12   Erenumab -aooe (AIMOVIG ) 140 MG/ML SOAJ Inject 140 mg into the skin every 30 (thirty) days. 1 mL 11   fluticasone  furoate-vilanterol (BREO ELLIPTA ) 200-25 MCG/ACT AEPB Inhale 1 puff into the lungs daily. 60 each 3   gabapentin  (NEURONTIN ) 300 MG capsule Take 1 capsule (300 mg total) by mouth 2 (two) times daily. 180 capsule 3   hydrOXYzine  (ATARAX ) 25 MG tablet Take 1 tablet (25 mg total) by mouth at bedtime as needed. 30 tablet 5   ibuprofen  (ADVIL ) 600 MG tablet Take 600 mg by mouth every 6 (six) hours as needed.     MELATONIN PO Take 3 mg by mouth as needed.     methylPREDNISolone  (MEDROL  DOSEPAK) 4 MG TBPK tablet Taper pack as directed 21 tablet 0   norethindrone  (MICRONOR ) 0.35 MG tablet Take 1 tablet (0.35 mg total) by mouth daily. 84 tablet 11   ondansetron  (ZOFRAN -ODT) 4 MG disintegrating tablet Dissolve 1 tablet by mouth every 6 hours as needed for nausea or for headache. 30 tablet 0   rizatriptan  (MAXALT -MLT) 10 MG disintegrating tablet DISSOLVE 1 TABLET BY MOUTH AS NEEDED FOR MIGRAINE, MAY REPEAT IN 2 HOURS IF NEEDED 9 tablet 11   tirzepatide (MOUNJARO) 2.5 MG/0.5ML Pen Inject 2.5 mg into the skin once a week. 2 mL 0   tretinoin  (RETIN-A ) 0.025 %  cream Apply as directed to affected area every night Apply to face nightly for acne 20 g 5   Current Facility-Administered Medications  Medication Dose Route Frequency Provider Last Rate Last Admin   triamcinolone  acetonide (KENALOG -40) injection 10 mg  10 mg Intramuscular Once Marilynne Rosaline SAILOR, MD        Allergies as of 04/14/2024 - Review Complete 04/07/2024  Allergen Reaction Noted   Bactrim  [sulfamethoxazole -trimethoprim ] Hives 11/06/2015   Hydrocodone  11/05/2009   Sulfa  antibiotics Hives 02/26/2017   Vicodin [hydrocodone-acetaminophen ] Nausea And Vomiting 04/07/2013    Vitals: There were no vitals filed for this visit.  There is no height or weight on file to calculate BMI.  Physical exam: General: well developed, well nourished, very pleasant middle-age Caucasian female, seated, with obvious headache, darker room, eyes squinting   Neurologic Exam Mental Status: Awake and fully alert. Oriented to place and time. Recent and remote memory intact. Attention span, concentration and fund of knowledge appropriate. Mood and affect appropriate.  Cranial Nerves: Pupils equal, briskly reactive to light. Extraocular movements full without nystagmus. Visual fields full to confrontation. Hearing intact. Facial sensation intact. Face, tongue, palate moves normally and symmetrically.  Motor: Normal bulk and tone. Normal strength in all tested extremity muscles. Gait and Station: Arises from chair without difficulty. Stance is normal. Gait demonstrates normal stride length and balance Reflexes: 1+ and symmetric. Toes downgoing.       Assessment/Plan:  38 year old with chronic migraines with severe migraine 05/2019 accompanied by slurred speech, right arm heaviness and unsteadiness with symptoms shortly resolving.  Migraines greatly controlled on Aimovig , typcially occur around menstrual cycle. Has had persistent migraine over the past 3 days likely triggered by heat and barometric pressure.   Acute migraine headache Provide Toradol  60mg  IM injection today Start Medrol  Dosepak - she is aware to monitor glucose levels Discussed potential side effects of these medications, she verbalized understanding and agrees to proceed Advised to call if headache persists   2.  Chronic  migraine without aura Preventative: Continue Aimovig  140 mg SQ monthly and gabapentin  300 mg daily  Emergent: Continue migraine cocktail of rizatriptan , baclofen , gabapentin  300mg  capsule, and ibuprofen . Use of Zofran  as needed   Tried/failed: Topiramate, Imitrex, Nurtec and Ubrelvy       Follow-up in October as scheduled or earlier if needed    CC:  Tisovec, Charlie ORN, MD     I personally spent a total of 25 minutes in the care of the patient today including preparing to see the patient, performing a medically appropriate exam/evaluation, counseling and educating, placing orders, and documenting clinical information in the EHR.   Harlene Bogaert, AGNP-BC  [facility] [address] [place], [place] [address]  Phone [phone] Fax [phone] Note: This document was prepared with digital dictation and possible smart phrase technology. Any transcriptional errors that result from this process are unintentional.  agree with assessment and plan as stated.     Onetha Epp, MD [facility]

## 2024-04-14 ENCOUNTER — Telehealth: Payer: Commercial Managed Care - PPO | Admitting: Adult Health

## 2024-04-14 ENCOUNTER — Other Ambulatory Visit: Payer: Self-pay

## 2024-04-14 ENCOUNTER — Encounter: Payer: Self-pay | Admitting: Adult Health

## 2024-04-14 DIAGNOSIS — G4733 Obstructive sleep apnea (adult) (pediatric): Secondary | ICD-10-CM | POA: Diagnosis not present

## 2024-04-14 DIAGNOSIS — G43829 Menstrual migraine, not intractable, without status migrainosus: Secondary | ICD-10-CM | POA: Diagnosis not present

## 2024-04-14 DIAGNOSIS — G43709 Chronic migraine without aura, not intractable, without status migrainosus: Secondary | ICD-10-CM | POA: Diagnosis not present

## 2024-04-14 MED ORDER — FROVATRIPTAN SUCCINATE 2.5 MG PO TABS
2.5000 mg | ORAL_TABLET | ORAL | 11 refills | Status: AC | PRN
  Filled 2024-04-14 – 2024-05-25 (×3): qty 12, 30d supply, fill #0

## 2024-04-14 NOTE — Addendum Note (Signed)
 Addended by: WHITFIELD RAISIN L on: 04/14/2024 04:11 PM   Modules accepted: Orders

## 2024-04-15 ENCOUNTER — Other Ambulatory Visit (HOSPITAL_COMMUNITY): Payer: Self-pay

## 2024-04-20 ENCOUNTER — Encounter: Payer: Self-pay | Admitting: Nurse Practitioner

## 2024-04-21 ENCOUNTER — Other Ambulatory Visit (HOSPITAL_COMMUNITY): Payer: Self-pay

## 2024-04-21 DIAGNOSIS — G4733 Obstructive sleep apnea (adult) (pediatric): Secondary | ICD-10-CM | POA: Diagnosis not present

## 2024-04-25 ENCOUNTER — Other Ambulatory Visit: Payer: Self-pay

## 2024-04-25 ENCOUNTER — Encounter: Payer: Self-pay | Admitting: Adult Health

## 2024-04-25 ENCOUNTER — Other Ambulatory Visit: Payer: Self-pay | Admitting: Nurse Practitioner

## 2024-04-25 ENCOUNTER — Other Ambulatory Visit (HOSPITAL_COMMUNITY): Payer: Self-pay

## 2024-04-25 DIAGNOSIS — E119 Type 2 diabetes mellitus without complications: Secondary | ICD-10-CM

## 2024-04-25 MED ORDER — TIRZEPATIDE 5 MG/0.5ML ~~LOC~~ SOAJ
5.0000 mg | SUBCUTANEOUS | 0 refills | Status: DC
  Filled 2024-04-25 – 2024-05-02 (×3): qty 2, 28d supply, fill #0

## 2024-04-28 ENCOUNTER — Other Ambulatory Visit (HOSPITAL_COMMUNITY): Payer: Self-pay

## 2024-04-28 ENCOUNTER — Other Ambulatory Visit: Payer: Self-pay

## 2024-05-02 ENCOUNTER — Other Ambulatory Visit (HOSPITAL_COMMUNITY): Payer: Self-pay

## 2024-05-10 ENCOUNTER — Encounter: Payer: Self-pay | Admitting: Nurse Practitioner

## 2024-05-10 ENCOUNTER — Ambulatory Visit: Admitting: Nurse Practitioner

## 2024-05-10 ENCOUNTER — Other Ambulatory Visit (HOSPITAL_COMMUNITY): Payer: Self-pay

## 2024-05-10 VITALS — BP 101/70 | HR 79 | Temp 98.1°F | Ht 62.0 in | Wt 202.0 lb

## 2024-05-10 DIAGNOSIS — E119 Type 2 diabetes mellitus without complications: Secondary | ICD-10-CM | POA: Diagnosis not present

## 2024-05-10 DIAGNOSIS — E66812 Obesity, class 2: Secondary | ICD-10-CM

## 2024-05-10 DIAGNOSIS — Z7985 Long-term (current) use of injectable non-insulin antidiabetic drugs: Secondary | ICD-10-CM | POA: Diagnosis not present

## 2024-05-10 DIAGNOSIS — Z6836 Body mass index (BMI) 36.0-36.9, adult: Secondary | ICD-10-CM

## 2024-05-10 MED ORDER — TIRZEPATIDE 5 MG/0.5ML ~~LOC~~ SOAJ
5.0000 mg | SUBCUTANEOUS | 0 refills | Status: DC
  Filled 2024-05-10 – 2024-05-25 (×2): qty 2, 28d supply, fill #0

## 2024-05-10 NOTE — Progress Notes (Signed)
 Office: [phone]  /  Fax: [phone]  WEIGHT SUMMARY AND BIOMETRICS  Weight Lost Since Last Visit: 1lb  Weight Gained Since Last Visit: 0lb   Vitals Temp: 98.1 F (36.7 C) BP: 101/70 Pulse Rate: 79 SpO2: 98 %   Anthropometric Measurements Height: 5' 2 (1.575 m) Weight: 202 lb (91.6 kg) BMI (Calculated): 36.94 Weight at Last Visit: 203lb Weight Lost Since Last Visit: 1lb Weight Gained Since Last Visit: 0lb Starting Weight: 208lb Total Weight Loss (lbs): 6 lb (2.722 kg)   Body Composition  Body Fat %: 43.6 % Fat Mass (lbs): 88.4 lbs Muscle Mass (lbs): 108.6 lbs Total Body Water (lbs): 78.8 lbs Visceral Fat Rating : 10   Other Clinical Data Fasting: Yes Labs: No Today's Visit #: 19 Starting Date: 09/17/22     HPI  Chief Complaint: OBESITY  Joniya is here to discuss her progress with her obesity treatment plan. She is on the keeping a food journal and adhering to recommended goals of 1700 calories and 100 protein and states she is following her eating plan approximately 75 % of the time. She states she is exercising 90 minutes 3 days per week.   Interval History:  Since last office visit she has lost 1 pounds.  She is averaging around 1700-1800 calories and 70-80 grams of protein.  She is drinking water and a protein shake daily.  She recently joined the [facility] and has been going 3 days per week for the past 3 weeks.  She is walking on the treadmill for 30 mins (with an incline) and resistance training 60 minutes.     BF:  2 eggs, greek yogurt with a protein granola Snack:  sometimes pretzels  Lunch:  steak 8 oz and broccoli  Snack:  nuts and dried fruit mix, jolly ranchers  Dinner:  soups  Pharmacotherapy for weight loss: She is currently taking Wellbutrin  SR 150mg  off label for cravings.  Denies side effects. Has helped with nighttime cravings and has helped to increase her energy levels.  Her mood is overall better     Previous pharmacotherapy  for medical weight loss:  None   Bariatric surgery:  Patient has not had bariatric surgery  Pharmacotherapy for DMT2:   She is currently taking Mounjaro  5mg .  Denies side effects.  She noted a significant amount of polyphagia and cravings while taking Mounjaro  2.5mg .  She took her first dose of Mounjaro  5mg  yesterday. Last A1c was 5.9 on 02/17/24 She is not checking BS at home.   On Lipitor 20mg . Last eye exam:  Oct 2024-will ask about next visit.  She has tried Ozempic  and Glyburide in the past. Struggling with polyphagia and cravings  Lab Results  Component Value Date   HGBA1C 5.9 02/17/2024   HGBA1C 6.2 (H) 10/31/2019   Lab Results  Component Value Date   LDLCALC 54 02/17/2024   CREATININE 0.6 02/17/2024     PHYSICAL EXAM:  Blood pressure 101/70, pulse 79, temperature 98.1 F (36.7 C), height 5' 2 (1.575 m), weight 202 lb (91.6 kg), last menstrual period 05/10/2024, SpO2 98%. Body mass index is 36.95 kg/m.  General: She is overweight, cooperative, alert, well developed, and in no acute distress. PSYCH: Has normal mood, affect and thought process.   Extremities: No edema.  Neurologic: No gross sensory or motor deficits. No tremors or fasciculations noted.    DIAGNOSTIC DATA REVIEWED:  BMET    Component Value Date/Time   NA 140 02/17/2024 0000   K 4.1 02/17/2024  0000   CL 107 02/17/2024 0000   CO2 25 (A) 02/17/2024 0000   GLUCOSE 104 (H) 12/17/2023 0949   GLUCOSE 151 (H) 06/16/2017 2322   BUN 11 02/17/2024 0000   CREATININE 0.6 02/17/2024 0000   CREATININE 0.74 12/17/2023 0949   CALCIUM  8.7 02/17/2024 0000   GFRNONAA 115 10/31/2019 1518   GFRAA 132 10/31/2019 1518   Lab Results  Component Value Date   HGBA1C 5.9 02/17/2024   HGBA1C 6.2 (H) 10/31/2019   Lab Results  Component Value Date   INSULIN  21.5 09/17/2022   Lab Results  Component Value Date   TSH 1.660 12/17/2023   CBC    Component Value Date/Time   WBC 7.7 02/17/2024 0000   WBC 8.5  07/14/2017 1209   RBC 4.9 02/17/2024 0000   HGB 13.6 02/17/2024 0000   HGB 13.7 12/17/2023 0949   HCT 41 02/17/2024 0000   HCT 45.3 12/17/2023 0949   PLT 269 02/17/2024 0000   PLT 289 12/17/2023 0949   MCV 90 12/17/2023 0949   MCH 27.1 12/17/2023 0949   MCH 27.9 06/16/2017 2322   MCHC 30.2 (L) 12/17/2023 0949   MCHC 33.6 07/14/2017 1209   RDW 13.4 12/17/2023 0949   Iron Studies    Component Value Date/Time   IRON 40 10/31/2019 1518   TIBC 333 10/31/2019 1518   FERRITIN 60 10/31/2019 1518   IRONPCTSAT 12 (L) 10/31/2019 1518   Lipid Panel     Component Value Date/Time   CHOL 121 02/17/2024 0000   CHOL 160 12/17/2023 0949   TRIG 162 (A) 02/17/2024 0000   HDL 35 02/17/2024 0000   HDL 39 (L) 12/17/2023 0949   LDLCALC 54 02/17/2024 0000   LDLCALC 90 12/17/2023 0949   Hepatic Function Panel     Component Value Date/Time   PROT 6.7 12/17/2023 0949   ALBUMIN 4.2 02/17/2024 0000   ALBUMIN 4.3 12/17/2023 0949   AST 13 02/17/2024 0000   ALT 18 02/17/2024 0000   ALKPHOS 0.3 (A) 02/17/2024 0000   BILITOT <0.2 12/17/2023 0949      Component Value Date/Time   TSH 1.660 12/17/2023 0949   Nutritional Lab Results  Component Value Date   VD25OH 30.9 12/17/2023     ASSESSMENT AND PLAN  TREATMENT PLAN FOR OBESITY:  Recommended Dietary Goals  Kalany is currently in the action stage of change. As such, her goal is to continue weight management plan. She has agreed to keeping a food journal and adhering to recommended goals of 1500 calories and 100 + grams of  protein.  Behavioral Intervention  We discussed the following Behavioral Modification Strategies today: increasing lean protein intake to established goals, decreasing simple carbohydrates , increasing vegetables, increasing fiber rich foods, increasing water intake , work on meal planning and preparation, reading food labels , keeping healthy foods at home, celebration eating strategies, continue to work on  maintaining a reduced calorie state, getting the recommended amount of protein, incorporating whole foods, making healthy choices, staying well hydrated and practicing mindfulness when eating., and increase protein intake, fibrous foods (25 grams per day for women, 30 grams for men) and water to improve satiety and decrease hunger signals. .  Additional resources provided today: NA  Recommended Physical Activity Goals  Sheniece has been advised to work up to 150 minutes of moderate intensity aerobic activity a week and strengthening exercises 2-3 times per week for cardiovascular health, weight loss maintenance and preservation of muscle mass.   She has  agreed to Think about enjoyable ways to increase daily physical activity and overcoming barriers to exercise, Increase physical activity in their day and reduce sedentary time (increase NEAT)., Continue to gradually increase the amount and intensity of exercise routine, Increase volume of physical activity to a goal of 240 minutes a week, and Combine aerobic and strengthening exercises for efficiency and improved cardiometabolic health.   ASSOCIATED CONDITIONS ADDRESSED TODAY  Action/Plan  Controlled type 2 diabetes mellitus without complication, without long-term current use of insulin  (HCC) -     Continue Tirzepatide ; Inject 5 mg into the skin once a week.  Dispense: 2 mL; Refill: 0.  Side effects discussed.   Obesity, Class II, BMI 35-39.9         Return in about 6 weeks (around 06/21/2024).SABRA She was informed of the importance of frequent follow up visits to maximize her success with intensive lifestyle modifications for her multiple health conditions.   ATTESTASTION STATEMENTS:  Reviewed by clinician on day of visit: allergies, medications, problem list, medical history, surgical history, family history, social history, and previous encounter notes.     Corean SAUNDERS. Mazelle Huebert FNP-C

## 2024-05-11 ENCOUNTER — Other Ambulatory Visit (HOSPITAL_COMMUNITY): Payer: Self-pay

## 2024-05-24 DIAGNOSIS — E119 Type 2 diabetes mellitus without complications: Secondary | ICD-10-CM | POA: Diagnosis not present

## 2024-05-24 DIAGNOSIS — H5213 Myopia, bilateral: Secondary | ICD-10-CM | POA: Diagnosis not present

## 2024-05-25 ENCOUNTER — Telehealth (HOSPITAL_COMMUNITY): Payer: Self-pay

## 2024-05-25 ENCOUNTER — Other Ambulatory Visit (HOSPITAL_COMMUNITY): Payer: Self-pay

## 2024-05-25 NOTE — Telephone Encounter (Signed)
 Medication: Frovatriptan  2.5mg  Able to fill? No Prior authorization required? Yes Co-pay before assistance: N/A

## 2024-05-26 ENCOUNTER — Other Ambulatory Visit (HOSPITAL_COMMUNITY): Payer: Self-pay

## 2024-05-26 ENCOUNTER — Telehealth: Payer: Self-pay

## 2024-05-26 ENCOUNTER — Other Ambulatory Visit: Payer: Self-pay

## 2024-05-26 DIAGNOSIS — G43829 Menstrual migraine, not intractable, without status migrainosus: Secondary | ICD-10-CM

## 2024-05-26 NOTE — Telephone Encounter (Signed)
 She's already tried both sumatriptan and rizatriptan . Can this be sent to pharmacist for an appeal?

## 2024-05-26 NOTE — Telephone Encounter (Signed)
 Pharmacy Patient Advocate Encounter   Received notification from Patient Pharmacy that prior authorization for Frovatriptan  is required/requested.   Insurance verification completed.   The patient is insured through [facility].   Per test claim: Per test claim, medication is not covered due to plan/benefit exclusion, PA not submitted at this time     Plan will not allow a PA for this med-will allow for Sumatriptan or Rizatriptan 

## 2024-05-26 NOTE — Telephone Encounter (Signed)
 PA request has been Cancelled. New Encounter has been or will be created for follow up. For additional info see Pharmacy Prior Auth telephone encounter from 05/26/2024.  This is a plan exclusion and will not allow a PA to be submitted. Message has been sent to [employer].

## 2024-05-30 NOTE — Telephone Encounter (Signed)
 Attempted to rx frovatriptan  for menstrual migraine. Can try to see if insurance will cover zolmitriptan  which can also be used for menstrual migraines.  If patient wishes to try this, let me know and I will place order. Thank you.

## 2024-05-31 ENCOUNTER — Other Ambulatory Visit (HOSPITAL_COMMUNITY): Payer: Self-pay

## 2024-05-31 MED ORDER — ZOLMITRIPTAN 2.5 MG PO TABS
2.5000 mg | ORAL_TABLET | ORAL | 11 refills | Status: AC
  Filled 2024-05-31: qty 14, 30d supply, fill #0
  Filled 2024-07-12: qty 36, 90d supply, fill #1

## 2024-06-01 ENCOUNTER — Encounter: Payer: Self-pay | Admitting: Nurse Practitioner

## 2024-06-02 ENCOUNTER — Other Ambulatory Visit (HOSPITAL_COMMUNITY): Payer: Self-pay

## 2024-06-02 ENCOUNTER — Other Ambulatory Visit: Payer: Self-pay

## 2024-06-03 ENCOUNTER — Other Ambulatory Visit: Payer: Self-pay

## 2024-06-07 ENCOUNTER — Other Ambulatory Visit (HOSPITAL_COMMUNITY): Payer: Self-pay

## 2024-06-08 ENCOUNTER — Other Ambulatory Visit (HOSPITAL_COMMUNITY): Payer: Self-pay

## 2024-06-08 ENCOUNTER — Other Ambulatory Visit (HOSPITAL_BASED_OUTPATIENT_CLINIC_OR_DEPARTMENT_OTHER): Payer: Self-pay

## 2024-06-08 ENCOUNTER — Encounter: Payer: Self-pay | Admitting: Nurse Practitioner

## 2024-06-08 ENCOUNTER — Ambulatory Visit: Admitting: Nurse Practitioner

## 2024-06-08 VITALS — BP 115/78 | HR 78 | Temp 98.4°F | Ht 62.0 in | Wt 205.0 lb

## 2024-06-08 DIAGNOSIS — Z7985 Long-term (current) use of injectable non-insulin antidiabetic drugs: Secondary | ICD-10-CM

## 2024-06-08 DIAGNOSIS — R638 Other symptoms and signs concerning food and fluid intake: Secondary | ICD-10-CM

## 2024-06-08 DIAGNOSIS — E66812 Obesity, class 2: Secondary | ICD-10-CM | POA: Diagnosis not present

## 2024-06-08 DIAGNOSIS — E119 Type 2 diabetes mellitus without complications: Secondary | ICD-10-CM | POA: Diagnosis not present

## 2024-06-08 DIAGNOSIS — Z6837 Body mass index (BMI) 37.0-37.9, adult: Secondary | ICD-10-CM

## 2024-06-08 MED ORDER — TIRZEPATIDE 7.5 MG/0.5ML ~~LOC~~ SOAJ
7.5000 mg | SUBCUTANEOUS | 0 refills | Status: DC
  Filled 2024-06-08 – 2024-06-20 (×3): qty 2, 28d supply, fill #0

## 2024-06-08 MED ORDER — BUPROPION HCL ER (SR) 150 MG PO TB12
150.0000 mg | ORAL_TABLET | Freq: Every day | ORAL | 0 refills | Status: AC
  Filled 2024-06-08: qty 90, 90d supply, fill #0

## 2024-06-08 NOTE — Progress Notes (Signed)
 "  Office: [phone]  /  Fax: [phone]  WEIGHT SUMMARY AND BIOMETRICS  Weight Lost Since Last Visit: 0lb  Weight Gained Since Last Visit: 3lb   Vitals Temp: 98.4 F (36.9 C) BP: 115/78 Pulse Rate: 78 SpO2: 97 %   Anthropometric Measurements Height: 5' 2 (1.575 m) Weight: 205 lb (93 kg) BMI (Calculated): 37.49 Weight at Last Visit: 202lb Weight Lost Since Last Visit: 0lb Weight Gained Since Last Visit: 3lb Starting Weight: 208lb Total Weight Loss (lbs): 3 lb (1.361 kg)   Body Composition  Body Fat %: 44.6 % Fat Mass (lbs): 91.6 lbs Muscle Mass (lbs): 108.2 lbs Total Body Water (lbs): 79.4 lbs Visceral Fat Rating : 11   Other Clinical Data Fasting: No Labs: No Today's Visit #: 20 Starting Date: 09/17/22     HPI  Chief Complaint: OBESITY  Makayla is here to discuss her progress with her obesity treatment plan. She is on the keeping a food journal and adhering to recommended goals of 1700 calories and 100 protein and states she is following her eating plan approximately 0 % of the time. She states she is exercising 40+ minutes 3 days per week.   Interval History:  Since last office visit she has gained 3 pounds.  She is struggling with polyphagia, carvings (sweets) and night eating.  She is eating late at night and eating while she is sleeping.  She is finding that her Mounjaro  is wearing off in 3 days.  She does well the first 3 days after her injection.  She is eating around 2000-2200 calories.  She is drinking water and a protein shake daily.  She is walking with incline/hills on the treadmill 40 mins and resistance training 3 days per week.    BF:  2 eggs, greek yogurt with a protein granola Snack:  sometimes pretzels or skinny popcorn if at work, doesn't snack at home Lunch:  steak 8 oz and broccoli  or chicken with a vegetable and 1/2 cup rice Snack:  nuts and dried fruit mix, cheese Dinner:  soups or protein with vegetables Snack:  sweets,  candy  Pharmacotherapy for weight loss: She is currently taking Wellbutrin  SR 150mg  off label for cravings.  Denies side effects.  She stopped taking Celexa  last week due to shakes with both Celexa  and Wellbutrin . She feels that the Wellburtin works better for her.    Previous pharmacotherapy for medical weight loss:  None   Bariatric surgery:  Patient has not had bariatric surgery  Pharmacotherapy for DMT2:   She is currently taking Mounjaro  5mg .  Denies side effects.   Last A1c was 5.9 on 02/17/24 She is not checking BS at home.   On Lipitor 20mg . Last eye exam:  May 24, 2024 She has tried Ozempic  and Glyburide in the past. Struggling with polyphagia and cravings  Lab Results  Component Value Date   HGBA1C 5.9 02/17/2024   HGBA1C 6.2 (H) 10/31/2019   Lab Results  Component Value Date   LDLCALC 54 02/17/2024   CREATININE 0.6 02/17/2024      PHYSICAL EXAM:  Blood pressure 115/78, pulse 78, temperature 98.4 F (36.9 C), height 5' 2 (1.575 m), weight 205 lb (93 kg), last menstrual period 05/10/2024, SpO2 97%. Body mass index is 37.49 kg/m.  General: She is overweight, cooperative, alert, well developed, and in no acute distress. PSYCH: Has normal mood, affect and thought process.   Extremities: No edema.  Neurologic: No gross sensory or motor deficits. No tremors or  fasciculations noted.    DIAGNOSTIC DATA REVIEWED:  BMET    Component Value Date/Time   NA 140 02/17/2024 0000   K 4.1 02/17/2024 0000   CL 107 02/17/2024 0000   CO2 25 (A) 02/17/2024 0000   GLUCOSE 104 (H) 12/17/2023 0949   GLUCOSE 151 (H) 06/16/2017 2322   BUN 11 02/17/2024 0000   CREATININE 0.6 02/17/2024 0000   CREATININE 0.74 12/17/2023 0949   CALCIUM  8.7 02/17/2024 0000   GFRNONAA 115 10/31/2019 1518   GFRAA 132 10/31/2019 1518   Lab Results  Component Value Date   HGBA1C 5.9 02/17/2024   HGBA1C 6.2 (H) 10/31/2019   Lab Results  Component Value Date   INSULIN  21.5 09/17/2022   Lab  Results  Component Value Date   TSH 1.660 12/17/2023   CBC    Component Value Date/Time   WBC 7.7 02/17/2024 0000   WBC 8.5 07/14/2017 1209   RBC 4.9 02/17/2024 0000   HGB 13.6 02/17/2024 0000   HGB 13.7 12/17/2023 0949   HCT 41 02/17/2024 0000   HCT 45.3 12/17/2023 0949   PLT 269 02/17/2024 0000   PLT 289 12/17/2023 0949   MCV 90 12/17/2023 0949   MCH 27.1 12/17/2023 0949   MCH 27.9 06/16/2017 2322   MCHC 30.2 (L) 12/17/2023 0949   MCHC 33.6 07/14/2017 1209   RDW 13.4 12/17/2023 0949   Iron Studies    Component Value Date/Time   IRON 40 10/31/2019 1518   TIBC 333 10/31/2019 1518   FERRITIN 60 10/31/2019 1518   IRONPCTSAT 12 (L) 10/31/2019 1518   Lipid Panel     Component Value Date/Time   CHOL 121 02/17/2024 0000   CHOL 160 12/17/2023 0949   TRIG 162 (A) 02/17/2024 0000   HDL 35 02/17/2024 0000   HDL 39 (L) 12/17/2023 0949   LDLCALC 54 02/17/2024 0000   LDLCALC 90 12/17/2023 0949   Hepatic Function Panel     Component Value Date/Time   PROT 6.7 12/17/2023 0949   ALBUMIN 4.2 02/17/2024 0000   ALBUMIN 4.3 12/17/2023 0949   AST 13 02/17/2024 0000   ALT 18 02/17/2024 0000   ALKPHOS 0.3 (A) 02/17/2024 0000   BILITOT <0.2 12/17/2023 0949      Component Value Date/Time   TSH 1.660 12/17/2023 0949   Nutritional Lab Results  Component Value Date   VD25OH 30.9 12/17/2023     ASSESSMENT AND PLAN  TREATMENT PLAN FOR OBESITY:  Recommended Dietary Goals  Amorina is currently in the action stage of change. As such, her goal is to continue weight management plan. She has agreed to keeping a food journal and adhering to recommended goals of 1700 calories and 90+ grams of protein.  Behavioral Intervention  We discussed the following Behavioral Modification Strategies today: increasing lean protein intake to established goals, decreasing simple carbohydrates , increasing vegetables, increasing fiber rich foods, increasing water intake , reading food labels ,  keeping healthy foods at home, planning for success, better snacking choices, celebration eating strategies, continue to work on maintaining a reduced calorie state, getting the recommended amount of protein, incorporating whole foods, making healthy choices, staying well hydrated and practicing mindfulness when eating., and increase protein intake, fibrous foods (25 grams per day for women, 30 grams for men) and water to improve satiety and decrease hunger signals. .  Additional resources provided today: NA  Recommended Physical Activity Goals  Dawne has been advised to work up to 150 minutes of moderate intensity aerobic  activity a week and strengthening exercises 2-3 times per week for cardiovascular health, weight loss maintenance and preservation of muscle mass.   She has agreed to Think about enjoyable ways to increase daily physical activity and overcoming barriers to exercise, Increase physical activity in their day and reduce sedentary time (increase NEAT)., Continue to gradually increase the amount and intensity of exercise routine, Increase volume of physical activity to a goal of 240 minutes a week, and Combine aerobic and strengthening exercises for efficiency and improved cardiometabolic health.   Pharmacotherapy We discussed various medication options to help Shalita with her weight loss efforts and we both agreed to continue Wellbutrin  SR 150mg  off label for cravings.  Side effects discussed.  Consider increasing dose in the future.    ASSOCIATED CONDITIONS ADDRESSED TODAY  Action/Plan  Controlled type 2 diabetes mellitus without complication, without long-term current use of insulin  (HCC) -     Increase Tirzepatide ; Inject 7.5 mg into the skin once a week.  Dispense: 2 mL; Refill: 0. Side effects discussed.   Abnormal craving -     continue buPROPion  HCl ER (SR); Take 1 tablet (150 mg total) by mouth daily.  Dispense: 90 tablet; Refill: 0. Side effects discussed.   Discussed increasing dose.  Will consider in the future.  She would like to see how she does with increasing Mounjaro  dose.    Obesity, Class II, BMI 35-39.9         Return in about 4 weeks (around 07/06/2024).SABRA She was informed of the importance of frequent follow up visits to maximize her success with intensive lifestyle modifications for her multiple health conditions.   ATTESTASTION STATEMENTS:  Reviewed by clinician on day of visit: allergies, medications, problem list, medical history, surgical history, family history, social history, and previous encounter notes.     Corean SAUNDERS. Tyrique Sporn FNP-C "

## 2024-06-17 ENCOUNTER — Other Ambulatory Visit: Payer: Self-pay | Admitting: Adult Health

## 2024-06-17 ENCOUNTER — Other Ambulatory Visit (HOSPITAL_BASED_OUTPATIENT_CLINIC_OR_DEPARTMENT_OTHER): Payer: Self-pay

## 2024-06-17 DIAGNOSIS — G43709 Chronic migraine without aura, not intractable, without status migrainosus: Secondary | ICD-10-CM

## 2024-06-20 ENCOUNTER — Other Ambulatory Visit (HOSPITAL_BASED_OUTPATIENT_CLINIC_OR_DEPARTMENT_OTHER): Payer: Self-pay

## 2024-06-20 ENCOUNTER — Other Ambulatory Visit: Payer: Self-pay

## 2024-06-20 ENCOUNTER — Encounter: Payer: Self-pay | Admitting: Adult Health

## 2024-06-20 DIAGNOSIS — G43109 Migraine with aura, not intractable, without status migrainosus: Secondary | ICD-10-CM

## 2024-06-20 DIAGNOSIS — G43709 Chronic migraine without aura, not intractable, without status migrainosus: Secondary | ICD-10-CM

## 2024-06-20 DIAGNOSIS — R519 Headache, unspecified: Secondary | ICD-10-CM

## 2024-06-20 MED ORDER — AIMOVIG 140 MG/ML ~~LOC~~ SOAJ
140.0000 mg | SUBCUTANEOUS | 11 refills | Status: AC
  Filled 2024-06-20: qty 1, 30d supply, fill #0
  Filled 2024-07-12 – 2024-07-14 (×2): qty 1, 30d supply, fill #1

## 2024-06-20 MED ORDER — GABAPENTIN 300 MG PO CAPS
300.0000 mg | ORAL_CAPSULE | Freq: Two times a day (BID) | ORAL | 3 refills | Status: AC
  Filled 2024-06-20: qty 180, 90d supply, fill #0

## 2024-06-21 ENCOUNTER — Ambulatory Visit: Admitting: Nurse Practitioner

## 2024-06-21 NOTE — Addendum Note (Signed)
 Addended by: WHITFIELD RAISIN L on: 06/21/2024 01:07 PM   Modules accepted: Orders

## 2024-06-22 ENCOUNTER — Other Ambulatory Visit

## 2024-06-22 DIAGNOSIS — G43109 Migraine with aura, not intractable, without status migrainosus: Secondary | ICD-10-CM | POA: Diagnosis not present

## 2024-06-22 DIAGNOSIS — R519 Headache, unspecified: Secondary | ICD-10-CM

## 2024-06-22 DIAGNOSIS — G43709 Chronic migraine without aura, not intractable, without status migrainosus: Secondary | ICD-10-CM | POA: Diagnosis not present

## 2024-06-22 MED ORDER — GADOBENATE DIMEGLUMINE 529 MG/ML IV SOLN
20.0000 mL | Freq: Once | INTRAVENOUS | Status: AC | PRN
  Administered 2024-06-22: 20 mL via INTRAVENOUS

## 2024-06-23 ENCOUNTER — Ambulatory Visit: Payer: Self-pay | Admitting: Adult Health

## 2024-07-07 ENCOUNTER — Encounter: Payer: Self-pay | Admitting: Nurse Practitioner

## 2024-07-07 ENCOUNTER — Other Ambulatory Visit: Payer: Self-pay

## 2024-07-07 ENCOUNTER — Ambulatory Visit: Admitting: Nurse Practitioner

## 2024-07-07 ENCOUNTER — Other Ambulatory Visit (HOSPITAL_COMMUNITY): Payer: Self-pay

## 2024-07-07 VITALS — BP 112/73 | HR 75 | Temp 98.2°F | Ht 62.0 in | Wt 202.0 lb

## 2024-07-07 DIAGNOSIS — E66812 Obesity, class 2: Secondary | ICD-10-CM

## 2024-07-07 DIAGNOSIS — G4733 Obstructive sleep apnea (adult) (pediatric): Secondary | ICD-10-CM

## 2024-07-07 DIAGNOSIS — Z6836 Body mass index (BMI) 36.0-36.9, adult: Secondary | ICD-10-CM | POA: Diagnosis not present

## 2024-07-07 DIAGNOSIS — E119 Type 2 diabetes mellitus without complications: Secondary | ICD-10-CM | POA: Diagnosis not present

## 2024-07-07 DIAGNOSIS — Z7985 Long-term (current) use of injectable non-insulin antidiabetic drugs: Secondary | ICD-10-CM | POA: Diagnosis not present

## 2024-07-07 MED ORDER — TIRZEPATIDE 7.5 MG/0.5ML ~~LOC~~ SOAJ
7.5000 mg | SUBCUTANEOUS | 0 refills | Status: AC
  Filled 2024-07-07: qty 2, 28d supply, fill #0

## 2024-07-07 NOTE — Progress Notes (Signed)
 "  Office: [phone]  /  Fax: [phone]  WEIGHT SUMMARY AND BIOMETRICS  Weight Lost Since Last Visit: 3lb  Weight Gained Since Last Visit: 0lb   Vitals Temp: 98.2 F (36.8 C) BP: 112/73 Pulse Rate: 75 SpO2: 97 %   Anthropometric Measurements Height: 5' 2 (1.575 m) Weight: 202 lb (91.6 kg) BMI (Calculated): 36.94 Weight at Last Visit: 205lb Weight Lost Since Last Visit: 3lb Weight Gained Since Last Visit: 0lb Starting Weight: 208lb Total Weight Loss (lbs): 6 lb (2.722 kg)   Body Composition  Body Fat %: 43.8 % Fat Mass (lbs): 88.6 lbs Muscle Mass (lbs): 108 lbs Total Body Water (lbs): 79.2 lbs Visceral Fat Rating : 11   Other Clinical Data Fasting: Yes Labs: No Today's Visit #: 21 Starting Date: 09/17/22     HPI  Chief Complaint: OBESITY  Abigail Frank is here to discuss her progress with her obesity treatment plan. She is on the keeping a food journal and adhering to recommended goals of 1500 calories and 100 protein and states she is following her eating plan approximately 100 % of the time. She states she is exercising 90 minutes 2-3 days per week.   Interval History:  Since last office visit she has lost 3 pounds. She is eating around 1500 calories. She is focusing on eating protein and vegetables daily.  She has stopped snacking at night.  She is drinking water and a protein shake daily after exercising.  She is doing the elliptical and treadmill 40 mins and resistance training 2-3 days per week.    BF:  2 eggs or turkey sausage and greek yogurt with a protein granola  Snack:  sometimes pretzels  Lunch:  steak 4-6 oz and broccoli or chicken with a vegetable Snack:  nuts and dried fruit mix, cheese Dinner:  soups or protein with vegetables Snack:  none  Pharmacotherapy for weight loss: She is currently taking Wellbutrin  SR 150mg  off label for cravings.  Denies side effects.  She restarted back her Celexa  around the [place] and is doing well with  Celexa  and Wellbutrin .      Previous pharmacotherapy for medical weight loss:  None   Bariatric surgery:  Patient has not had bariatric surgery   Pharmacotherapy for DMT2:   She is currently taking Mounjaro  7.5mg  x 2 doses (increased after her last dose).  Had some side effects of constipation.  She is taking colace and Miralax as needed with relief.  Last A1c was 5.9 on 02/17/24 She is not checking BS at home.   On Lipitor 20mg . Last eye exam:  May 24, 2024 She has tried Ozempic  and Glyburide in the past. Struggling with polyphagia and cravings  Lab Results  Component Value Date   HGBA1C 5.9 02/17/2024   HGBA1C 6.2 (H) 10/31/2019   Lab Results  Component Value Date   LDLCALC 54 02/17/2024   CREATININE 0.6 02/17/2024     Obstructive Sleep Apnea Abigail Frank has a diagnosis of sleep apnea. She reports that she is using a CPAP regularly. She switched recently to nasal pillows and is doing better.       PHYSICAL EXAM:  Blood pressure 112/73, pulse 75, temperature 98.2 F (36.8 C), height 5' 2 (1.575 m), weight 202 lb (91.6 kg), last menstrual period 06/08/2024, SpO2 97%. Body mass index is 36.95 kg/m.  General: She is overweight, cooperative, alert, well developed, and in no acute distress. PSYCH: Has normal mood, affect and thought process.   Extremities: No edema.  Neurologic: No gross sensory or motor deficits. No tremors or fasciculations noted.    DIAGNOSTIC DATA REVIEWED:  BMET    Component Value Date/Time   NA 140 02/17/2024 0000   K 4.1 02/17/2024 0000   CL 107 02/17/2024 0000   CO2 25 (A) 02/17/2024 0000   GLUCOSE 104 (H) 12/17/2023 0949   GLUCOSE 151 (H) 06/16/2017 2322   BUN 11 02/17/2024 0000   CREATININE 0.6 02/17/2024 0000   CREATININE 0.74 12/17/2023 0949   CALCIUM  8.7 02/17/2024 0000   GFRNONAA 115 10/31/2019 1518   GFRAA 132 10/31/2019 1518   Lab Results  Component Value Date   HGBA1C 5.9 02/17/2024   HGBA1C 6.2 (H) 10/31/2019   Lab Results   Component Value Date   INSULIN  21.5 09/17/2022   Lab Results  Component Value Date   TSH 1.660 12/17/2023   CBC    Component Value Date/Time   WBC 7.7 02/17/2024 0000   WBC 8.5 07/14/2017 1209   RBC 4.9 02/17/2024 0000   HGB 13.6 02/17/2024 0000   HGB 13.7 12/17/2023 0949   HCT 41 02/17/2024 0000   HCT 45.3 12/17/2023 0949   PLT 269 02/17/2024 0000   PLT 289 12/17/2023 0949   MCV 90 12/17/2023 0949   MCH 27.1 12/17/2023 0949   MCH 27.9 06/16/2017 2322   MCHC 30.2 (L) 12/17/2023 0949   MCHC 33.6 07/14/2017 1209   RDW 13.4 12/17/2023 0949   Iron Studies    Component Value Date/Time   IRON 40 10/31/2019 1518   TIBC 333 10/31/2019 1518   FERRITIN 60 10/31/2019 1518   IRONPCTSAT 12 (L) 10/31/2019 1518   Lipid Panel     Component Value Date/Time   CHOL 121 02/17/2024 0000   CHOL 160 12/17/2023 0949   TRIG 162 (A) 02/17/2024 0000   HDL 35 02/17/2024 0000   HDL 39 (L) 12/17/2023 0949   LDLCALC 54 02/17/2024 0000   LDLCALC 90 12/17/2023 0949   Hepatic Function Panel     Component Value Date/Time   PROT 6.7 12/17/2023 0949   ALBUMIN 4.2 02/17/2024 0000   ALBUMIN 4.3 12/17/2023 0949   AST 13 02/17/2024 0000   ALT 18 02/17/2024 0000   ALKPHOS 0.3 (A) 02/17/2024 0000   BILITOT <0.2 12/17/2023 0949      Component Value Date/Time   TSH 1.660 12/17/2023 0949   Nutritional Lab Results  Component Value Date   VD25OH 30.9 12/17/2023     ASSESSMENT AND PLAN  TREATMENT PLAN FOR OBESITY:  Recommended Dietary Goals  Abigail Frank is currently in the action stage of change. As such, her goal is to continue weight management plan. She has agreed to keeping a food journal and adhering to recommended goals of 1500-1600 calories and 80 + grams of protein.  Behavioral Intervention  We discussed the following Behavioral Modification Strategies today: increasing lean protein intake to established goals, decreasing simple carbohydrates , increasing vegetables, increasing  fiber rich foods, increasing water intake , work on meal planning and preparation, work on tracking and journaling calories using tracking application, reading food labels , keeping healthy foods at home, continue to work on implementation of reduced calorie nutritional plan, continue to practice mindfulness when eating, planning for success, continue to work on maintaining a reduced calorie state, getting the recommended amount of protein, incorporating whole foods, making healthy choices, staying well hydrated and practicing mindfulness when eating., and increase protein intake, fibrous foods (25 grams per day for women, 30 grams for men)  and water to improve satiety and decrease hunger signals. .  Additional resources provided today: NA  Recommended Physical Activity Goals  Kharma has been advised to work up to 150 minutes of moderate intensity aerobic activity a week and strengthening exercises 2-3 times per week for cardiovascular health, weight loss maintenance and preservation of muscle mass.   She has agreed to Think about enjoyable ways to increase daily physical activity and overcoming barriers to exercise, Increase physical activity in their day and reduce sedentary time (increase NEAT)., Continue to gradually increase the amount and intensity of exercise routine, Increase volume of physical activity to a goal of 240 minutes a week, and Combine aerobic and strengthening exercises for efficiency and improved cardiometabolic health.   ASSOCIATED CONDITIONS ADDRESSED TODAY  Action/Plan  Controlled type 2 diabetes mellitus without complication, without long-term current use of insulin  (HCC) -     Continue Tirzepatide ; Inject 7.5 mg into the skin once a week.  Dispense: 2 mL; Refill: 0.  Side effects discussed.    Obstructive sleep apnea syndrome Continue CPAP nightly.   Obesity, Class II, BMI 35-39.9         Return in about 4 weeks (around 08/04/2024).SABRA She was informed of the  importance of frequent follow up visits to maximize her success with intensive lifestyle modifications for her multiple health conditions.   ATTESTASTION STATEMENTS:  Reviewed by clinician on day of visit: allergies, medications, problem list, medical history, surgical history, family history, social history, and previous encounter notes.     Corean SAUNDERS. Abigail Macadam FNP-C "

## 2024-07-12 ENCOUNTER — Other Ambulatory Visit: Payer: Self-pay | Admitting: Adult Health

## 2024-07-12 DIAGNOSIS — G43709 Chronic migraine without aura, not intractable, without status migrainosus: Secondary | ICD-10-CM

## 2024-07-13 ENCOUNTER — Other Ambulatory Visit (HOSPITAL_COMMUNITY): Payer: Self-pay

## 2024-07-13 ENCOUNTER — Other Ambulatory Visit: Payer: Self-pay

## 2024-07-13 MED ORDER — RIZATRIPTAN BENZOATE 10 MG PO TBDP
10.0000 mg | ORAL_TABLET | ORAL | 11 refills | Status: AC | PRN
  Filled 2024-07-13: qty 9, 30d supply, fill #0

## 2024-07-13 MED ORDER — BACLOFEN 10 MG PO TABS
10.0000 mg | ORAL_TABLET | Freq: Every day | ORAL | 11 refills | Status: AC | PRN
  Filled 2024-07-13: qty 90, 90d supply, fill #0

## 2024-07-13 NOTE — Telephone Encounter (Signed)
 Per prior note,   1.  Chronic migraine without aura Preventative: Continue Aimovig  140 mg SQ monthly and gabapentin  300 mg BID   Emergent: Continue migraine cocktail of rizatriptan , baclofen , gabapentin  300mg  capsule, and ibuprofen . Use of Zofran  as needed    Tried/failed: Aimovig , gabapentin , topiramate, Imitrex, rizatriptan , Nurtec and Ubrelvy   Will refill as requested.

## 2024-07-14 ENCOUNTER — Other Ambulatory Visit (HOSPITAL_COMMUNITY): Payer: Self-pay

## 2024-07-14 ENCOUNTER — Other Ambulatory Visit: Payer: Self-pay

## 2024-08-10 ENCOUNTER — Ambulatory Visit: Admitting: Nurse Practitioner

## 2024-08-17 ENCOUNTER — Ambulatory Visit (HOSPITAL_BASED_OUTPATIENT_CLINIC_OR_DEPARTMENT_OTHER): Admitting: Cardiology

## 2024-10-13 ENCOUNTER — Ambulatory Visit (HOSPITAL_BASED_OUTPATIENT_CLINIC_OR_DEPARTMENT_OTHER): Admitting: Obstetrics and Gynecology
